# Patient Record
Sex: Male | Born: 1965 | Race: White | Hispanic: No | Marital: Married | Smoking: Former smoker
Health system: Southern US, Community
[De-identification: ages and names within clinical notes are randomized; demographics above are authoritative.]

## PROBLEM LIST (undated history)

## (undated) DIAGNOSIS — K219 Gastro-esophageal reflux disease without esophagitis: Secondary | ICD-10-CM

## (undated) DIAGNOSIS — E079 Disorder of thyroid, unspecified: Secondary | ICD-10-CM

## (undated) DIAGNOSIS — T7840XA Allergy, unspecified, initial encounter: Secondary | ICD-10-CM

## (undated) DIAGNOSIS — H5440 Blindness, one eye, unspecified eye: Secondary | ICD-10-CM

## (undated) DIAGNOSIS — Z1810 Retained metal fragments, unspecified: Secondary | ICD-10-CM

## (undated) DIAGNOSIS — R112 Nausea with vomiting, unspecified: Secondary | ICD-10-CM

## (undated) DIAGNOSIS — Z8489 Family history of other specified conditions: Secondary | ICD-10-CM

## (undated) DIAGNOSIS — I10 Essential (primary) hypertension: Secondary | ICD-10-CM

## (undated) DIAGNOSIS — F419 Anxiety disorder, unspecified: Secondary | ICD-10-CM

## (undated) DIAGNOSIS — L570 Actinic keratosis: Secondary | ICD-10-CM

## (undated) DIAGNOSIS — Z9889 Other specified postprocedural states: Secondary | ICD-10-CM

## (undated) HISTORY — DX: Anxiety disorder, unspecified: F41.9

## (undated) HISTORY — DX: Allergy, unspecified, initial encounter: T78.40XA

## (undated) HISTORY — DX: Actinic keratosis: L57.0

## (undated) HISTORY — DX: Disorder of thyroid, unspecified: E07.9

---

## 1985-10-30 HISTORY — PX: EYE SURGERY: SHX253

## 2001-10-30 HISTORY — PX: KNEE ARTHROSCOPY: SUR90

## 2004-11-21 ENCOUNTER — Ambulatory Visit: Payer: Self-pay

## 2005-09-27 ENCOUNTER — Ambulatory Visit: Payer: Self-pay

## 2011-10-06 ENCOUNTER — Encounter: Payer: Self-pay | Admitting: Internal Medicine

## 2011-10-06 ENCOUNTER — Ambulatory Visit (INDEPENDENT_AMBULATORY_CARE_PROVIDER_SITE_OTHER): Payer: 59 | Admitting: Internal Medicine

## 2011-10-06 VITALS — BP 122/84 | HR 69 | Temp 97.9°F | Resp 16 | Ht 69.0 in | Wt 213.2 lb

## 2011-10-06 DIAGNOSIS — Z23 Encounter for immunization: Secondary | ICD-10-CM

## 2011-10-06 DIAGNOSIS — E039 Hypothyroidism, unspecified: Secondary | ICD-10-CM

## 2011-10-06 DIAGNOSIS — R197 Diarrhea, unspecified: Secondary | ICD-10-CM | POA: Insufficient documentation

## 2011-10-06 NOTE — Progress Notes (Signed)
  Subjective:    Patient ID: Tim Owen, male    DOB: 05-Nov-1965, 45 y.o.   MRN: [id number]  HPI  Tim Owen is a healthy 45 yo white male who is transferring care form [address] [facility]).  In 2008 he had a History of 35 lb wt loss, followed by an even greater weight gain, accompanied by severe muscle cramps secondary to rhabdomyolysis and was eventually diagnosed with Hashimoto's thyroiditis.  He is currently symptom free except occasional muscle cramps and has recently recovered from a GI virus which started 3 days ago with N/V and diarrhea .    Past Medical History  Diagnosis Date  . hypothyroidism    No current outpatient prescriptions on file prior to visit.    Review of Systems  Constitutional: Negative for fever, chills, diaphoresis, activity change, appetite change, fatigue and unexpected weight change.  HENT: Negative for hearing loss, ear pain, nosebleeds, congestion, sore throat, facial swelling, rhinorrhea, sneezing, drooling, mouth sores, trouble swallowing, neck pain, neck stiffness, dental problem, voice change, postnasal drip, sinus pressure, tinnitus and ear discharge.   Eyes: Negative for photophobia, pain, discharge, redness, itching and visual disturbance.  Respiratory: Negative for apnea, cough, choking, chest tightness, shortness of breath, wheezing and stridor.   Cardiovascular: Negative for chest pain, palpitations and leg swelling.  Gastrointestinal: Negative for nausea, vomiting, abdominal pain, diarrhea, constipation, blood in stool, abdominal distention, anal bleeding and rectal pain.  Genitourinary: Negative for dysuria, urgency, frequency, hematuria, flank pain, decreased urine volume, scrotal swelling, difficulty urinating and testicular pain.  Musculoskeletal: Negative for myalgias, back pain, joint swelling, arthralgias and gait problem.  Skin: Negative for color change, rash and wound.  Neurological: Negative for dizziness, tremors, seizures,  syncope, speech difficulty, weakness, light-headedness, numbness and headaches.  Psychiatric/Behavioral: Negative for suicidal ideas, hallucinations, behavioral problems, confusion, sleep disturbance, dysphoric mood, decreased concentration and agitation. The patient is not nervous/anxious.        Objective:   Physical Exam  Constitutional: He is oriented to person, place, and time.  HENT:  Head: Normocephalic and atraumatic.  Mouth/Throat: Oropharynx is clear and moist.  Eyes: Conjunctivae and EOM are normal.  Neck: Normal range of motion. Neck supple. No JVD present. No thyromegaly present.  Cardiovascular: Normal rate, regular rhythm and normal heart sounds.   Pulmonary/Chest: Effort normal and breath sounds normal. He has no wheezes. He has no rales.  Abdominal: Soft. Bowel sounds are normal. He exhibits no mass. There is no tenderness. There is no rebound.  Musculoskeletal: Normal range of motion. He exhibits no edema.  Neurological: He is alert and oriented to person, place, and time.  Skin: Skin is warm and dry.  Psychiatric: He has a normal mood and affect.          Assessment & Plan:

## 2011-10-08 ENCOUNTER — Encounter: Payer: Self-pay | Admitting: Internal Medicine

## 2011-10-08 DIAGNOSIS — E039 Hypothyroidism, unspecified: Secondary | ICD-10-CM | POA: Insufficient documentation

## 2011-10-08 NOTE — Assessment & Plan Note (Signed)
Secondary to Hashimoto's thyroiditis whic presented with rhabdomyolysis and weight loss.  Now managed with Synthroid.  With TSH done in the last six months  .  Records requested.

## 2011-10-08 NOTE — Assessment & Plan Note (Signed)
secondary to viral gastroenteritis,  acute.  Will check renal function and electrolytes.

## 2011-10-08 NOTE — Patient Instructions (Signed)
Please return in 6 months for our annual physical.  We are checking your electrolytes given your recent diarrhea episode.

## 2011-10-10 ENCOUNTER — Telehealth: Payer: Self-pay | Admitting: Internal Medicine

## 2011-10-10 NOTE — Telephone Encounter (Signed)
[facility] labs reviewed,.  His thyroid level is fine,  Cholesterol is ok but his good cholesterol,   HDL is low at 36.  And LDL is ok at 110.  Kidney fucntion was a little off which can be from mild dehydration. .  Would lilke to repeat it in  3 month (BMET_ in a nonfasting state .  The mediterrranean Diet may help raise his HDL.  He can google it.

## 2011-10-12 NOTE — Telephone Encounter (Signed)
Left message asking patient to return my call.

## 2011-10-13 ENCOUNTER — Other Ambulatory Visit: Payer: Self-pay | Admitting: Internal Medicine

## 2011-10-13 DIAGNOSIS — E785 Hyperlipidemia, unspecified: Secondary | ICD-10-CM

## 2011-10-13 NOTE — Telephone Encounter (Signed)
Patient notified. Order for BMET mailed to patient. His wife is a [facility] employee and he will have labs drawn there.

## 2011-10-18 ENCOUNTER — Telehealth: Payer: Self-pay | Admitting: Internal Medicine

## 2011-10-18 NOTE — Telephone Encounter (Signed)
His thyroid function is within normal limits.  His liver enzymes and kidney function are a tiny bit  abnormal  And I dont' have any old labs for comparison  , so I would like to repeat a CMET in one month in a nfasting state  to see if both normalize.  cholsterol is ok,.

## 2011-10-18 NOTE — Telephone Encounter (Signed)
No, it was minimal and we can wait then ntil I see the old labs from his prior PCP

## 2011-10-18 NOTE — Telephone Encounter (Signed)
Patient advised as instructed via telephone.  He stated that his Creatine levels have always been elevated.  Would you still like him to repeat lab work again in one month?  Please advise.

## 2011-10-18 NOTE — Telephone Encounter (Signed)
Patient advised as instructed via telephone. 

## 2011-11-13 ENCOUNTER — Encounter: Payer: Self-pay | Admitting: Internal Medicine

## 2012-01-02 ENCOUNTER — Other Ambulatory Visit: Payer: Self-pay | Admitting: Internal Medicine

## 2012-01-02 MED ORDER — LEVOTHYROXINE SODIUM 137 MCG PO TABS: 137.0000 ug | ORAL_TABLET | Freq: Every day | ORAL | Status: DC

## 2012-02-06 ENCOUNTER — Telehealth: Payer: Self-pay | Admitting: Internal Medicine

## 2012-02-06 NOTE — Telephone Encounter (Signed)
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 States he "turned over" his left ankle. States he felt a "pop". States injury occurred at approx. 1100 02/06/12. States swelling and ecchymosis noted of lateral malleoulus. States ankle is swollen twice it's normal size. Patient is able to bear weight. Patient took Ibuprofen 600mg . @ approx. 1230 02/06/12. Triage per Ankle Injury Protocol. No emergent sx identified. Care advice given per guidelines. Patient advised ice, elevation, no weight bearing. Patient advised Ibuprofen 800mg . q 8 hours prn for pain, take with food. Patient advised not to drive. No appts. available in Epic System. RN spoke with Erie Noe in office. States to advise patient to go to [facility] for evaluation. Patient advised of above. Patient verbalizes understanding and agreeable. Protocol(s) Used: Ankle Injury Recommended Outcome per Protocol: See Provider within 4 hours Reason for Outcome: New marked swelling (twice the normal size as compared to usual appearance) Care Advice: ~ Another adult should drive. ~ Apply cloth-covered ice pack to affected area. Apply 20 minutes on, then 20 minutes off until evaluated by provider. ~ SYMPTOM / CONDITION MANAGEMENT ~ List, or take, all current prescription(s), nonprescription or alternative medication(s) to provider for evaluation. Elevate and support part in a position of comfort so it is elevated at least 12 inches (30 cm) above the level of heart until evaluated by provider. ~ Go to ED  immediately if extremity: - is cool below injury - has a change in sensation (numbness, tingling) - has a noticeable change in color (pale, bluish) ~ 02/06/2012 2:02:20PM Page 1 of 1 [id number]

## 2012-02-06 NOTE — Telephone Encounter (Signed)
I do not understand why triage does not direct this type of call to an ortho clinic or an urgent care clinic. He may have a fractured malleolus, and there is nothing I can do here except send him elsewhere. Plesae call patient in mornign and find out what he decided to do. [name]

## 2012-02-06 NOTE — Telephone Encounter (Signed)
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 States he "turned over" his left ankle. States he felt a "pop". States injury occurred at approx. 1100 02/06/12. States swelling and ecchymosis noted of lateral malleoulus. States ankle is swollen twice it's normal size. Patient is able to bear weight. Patient took Ibuprofen 600mg . @ approx. 1230 02/06/12. Triage per Ankle Injury Protocol. No emergent sx identified. Care advice given per guidelines. Patient advised ice, elevation, no weight bearing. Patient  advised Ibuprofen 800mg . q 8 hours prn for pain, take with food.  Patient advised not to drive. No appts. available in Epic System. RN spoke with Erie Noe in office. States to advise patient to go to [facility] for evaluation. Patient advised of above. Patient verbalizes understanding and agreeable.

## 2012-02-07 NOTE — Telephone Encounter (Signed)
The triage nurse did advise Urgent Care at the end of the message it states "patient advised to go to [facility] for evaluation".  I called him and he stated he did go to [facility] walk-in and he did fracture his foot.  He stated he has an appt with ortho next week.

## 2012-04-08 ENCOUNTER — Ambulatory Visit (INDEPENDENT_AMBULATORY_CARE_PROVIDER_SITE_OTHER): Payer: 59 | Admitting: Internal Medicine

## 2012-04-08 ENCOUNTER — Encounter: Payer: Self-pay | Admitting: Internal Medicine

## 2012-04-08 VITALS — BP 116/78 | HR 68 | Temp 97.6°F | Resp 16 | Wt 204.2 lb

## 2012-04-08 DIAGNOSIS — S82892A Other fracture of left lower leg, initial encounter for closed fracture: Secondary | ICD-10-CM | POA: Insufficient documentation

## 2012-04-08 DIAGNOSIS — S82899A Other fracture of unspecified lower leg, initial encounter for closed fracture: Secondary | ICD-10-CM

## 2012-04-08 DIAGNOSIS — E039 Hypothyroidism, unspecified: Secondary | ICD-10-CM | POA: Insufficient documentation

## 2012-04-08 DIAGNOSIS — Z79899 Other long term (current) drug therapy: Secondary | ICD-10-CM

## 2012-04-08 DIAGNOSIS — E785 Hyperlipidemia, unspecified: Secondary | ICD-10-CM

## 2012-04-08 DIAGNOSIS — R042 Hemoptysis: Secondary | ICD-10-CM | POA: Insufficient documentation

## 2012-04-08 DIAGNOSIS — R059 Cough, unspecified: Secondary | ICD-10-CM

## 2012-04-08 DIAGNOSIS — N529 Male erectile dysfunction, unspecified: Secondary | ICD-10-CM | POA: Insufficient documentation

## 2012-04-08 DIAGNOSIS — R05 Cough: Secondary | ICD-10-CM

## 2012-04-08 LAB — CBC WITH DIFFERENTIAL/PLATELET
Basophils Absolute: 0 10*3/uL (ref 0.0–0.2)
Eosinophils Absolute: 0.2 10*3/uL (ref 0.0–0.4)
Immature Grans (Abs): 0 10*3/uL (ref 0.0–0.1)
Lymphs: 20 % (ref 14–46)
MCH: 33.5 pg — ABNORMAL HIGH (ref 26.6–33.0)
MCHC: 34.3 g/dL (ref 31.5–35.7)
Monocytes Absolute: 0.8 10*3/uL (ref 0.1–1.0)
Neutrophils Absolute: 6.7 10*3/uL (ref 1.8–7.8)
Neutrophils Relative %: 69 % (ref 40–74)

## 2012-04-08 LAB — BASIC METABOLIC PANEL
BUN: 17 mg/dL (ref 6–24)
Calcium: 9.5 mg/dL (ref 8.7–10.2)
Chloride: 103 mmol/L (ref 97–108)
GFR calc Af Amer: 88 mL/min/{1.73_m2} (ref >59)
Glucose: 90 mg/dL (ref 65–99)

## 2012-04-08 LAB — LIPID PANEL
Cholesterol, Total: 176 mg/dL (ref 100–199)
LDL Calculated: 124 mg/dL — ABNORMAL HIGH (ref 0–99)
Triglycerides: 67 mg/dL (ref 0–149)
VLDL Cholesterol Cal: 13 mg/dL (ref 5–40)

## 2012-04-08 LAB — HEPATIC FUNCTION PANEL
ALT: 33 IU/L (ref 0–44)
Alkaline Phosphatase: 67 IU/L (ref 25–150)
Total Protein: 7.2 g/dL (ref 6.0–8.5)

## 2012-04-08 LAB — TSH: TSH: 3.01 u[IU]/mL (ref 0.450–4.500)

## 2012-04-08 MED ORDER — AMOXICILLIN-POT CLAVULANATE 875-125 MG PO TABS: 1.0000 | ORAL_TABLET | Freq: Two times a day (BID) | ORAL | Status: AC

## 2012-04-08 MED ORDER — BENZONATATE 200 MG PO CAPS: 200.0000 mg | ORAL_CAPSULE | Freq: Two times a day (BID) | ORAL | Status: AC | PRN

## 2012-04-08 MED ORDER — PREDNISONE (PAK) 10 MG PO TABS: ORAL_TABLET | ORAL | Status: AC

## 2012-04-08 MED ORDER — ALPRAZOLAM 0.25 MG PO TABS: 0.2500 mg | ORAL_TABLET | Freq: Every evening | ORAL | Status: DC | PRN

## 2012-04-08 NOTE — Assessment & Plan Note (Signed)
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 He has chosed not to purse corrective surgery.  Advised to discuss longterm complications of avoiding surgery (oa, ankle instability)

## 2012-04-08 NOTE — Assessment & Plan Note (Signed)
He brought up the recent development of impotence.  His erections are not as strong as they used to be, and he no longer wakes up with an erection as he used to .  He and his wife are dismayed by this development .  Checking Testosterone levels.

## 2012-04-08 NOTE — Assessment & Plan Note (Addendum)
He was treated for bronchitis several weeks ago with progression of symptoms to cough with blood streaked spuutm.  Given his history of tobacco abuse, will need to evaluate for empyema/mass.. But will treat for sinusitis/mucopuruelent tracheitis with augmentin, prednisone and cough suppressants. Also recommedned use of saline sinus irrigants.

## 2012-04-08 NOTE — Progress Notes (Signed)
Patient ID: Tim Owen, male   DOB: May 03, 1966, 46 y.o.   MRN: [id number]   Patient Active Problem List  Diagnoses  . Diarrhea  . Hypothyroidism (acquired)  . Impotence  . Unspecified hypothyroidism  . Cough with hemoptysis  . Closed left malleolar fracture    Subjective:  CC:   Chief Complaint  Patient presents with  . Follow-up    6 month    HPI:   Tim Owen a 46 y.o. male who presents follow up on several recent issues.  He sustained an avulsed lateral malleolar fracture of his left foot two months ago after jumping off of a flatbed.  The fracture was treated by an orthopedist with a surgical boot for two weeks with failure to heal and was referred to podiatry.  Dr. Clide Cliff offered surgery as the only reparative measure, but patient has deferred since he is currently having no pain unless he drags his foot sideways ( an unusual motion except in dancing and tennis).  More recently he was treated at [facility] for bronchitis with a 3 day Z pack and benzonatate for daytime cough,  tussionex for nighttime . His cough was nonproductive intially but has become producitve over the last 7 days with episodes of blood streaked sputum in the early morning and rust colored throughout the rest of the day,  He developed a sore throat started yesterday.  No travel except to the beach 3 weeks ago.  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 He has a history of tobacco use and quit smoking a year ago .   Past Medical History  Diagnosis Date  . hypothyroidism     Past Surgical History  Procedure Date  . 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 Knee arthroscopy 2003    right , with residual numbness medial side of calf         The following portions of the patient's history were reviewed and updated as appropriate: Allergies, current medications, and problem list.    Review of Systems:   12 Pt   review of systems was negative except those addressed in the HPI,     History   Social History  . Marital Status: Married    Spouse Name: N/A    Number of Children: N/A  . Years of Education: N/A   Occupational History  . Not on file.   Social History Main Topics  . Smoking status: Former Smoker    Types: Cigarettes    Quit date: 01/02/2011  . Smokeless tobacco: Never Used  . Alcohol Use: 7.2 oz/week    12 Cans of beer per week  . Drug Use: No  . Sexually Active: Not on file   Other Topics Concern  . Not on file   Social History Narrative  . No narrative on file    Objective:  BP 116/78  Pulse 68  Temp(Src) 97.6 F (36.4 C) (Oral)  Resp 16  Wt 204 lb 4 oz (92.647 kg)  SpO2 98%  General appearance: alert, cooperative and appears stated age Ears: normal TM's and external ear canals both ears Throat: lips, mucosa, and tongue normal; teeth and gums normal Neck: no adenopathy, no carotid bruit, supple, symmetrical, trachea midline and thyroid not enlarged, symmetric, no tenderness/mass/nodules Back: symmetric, no curvature. ROM normal. No CVA tenderness. Lungs: clear to auscultation bilaterally Heart: regular rate and  rhythm, S1, S2 normal, no murmur, click, rub or gallop Abdomen: soft, non-tender; bowel sounds normal; no masses,  no organomegaly Pulses: 2+ and symmetric Skin: Skin color, texture, turgor normal. No rashes or lesions Lymph nodes: Cervical, supraclavicular, and axillary nodes normal.  Assessment and Plan:  Cough with hemoptysis He was treated for bronchitis several weeks ago with progression of symptoms to cough with blood streaked spuutm.  Given his history of tobacco abuse, will need to evaluate for empyema/mass.. But will treat for sinusitis/mucopuruelent tracheitis with augmentin, prednisone and cough suppressants. Also recommedned use of saline sinus irrigants.   Impotence He brought up the recent development of impotence.  His erections are  not as strong as they used to be, and he no longer wakes up with an erection as he used to .  He and his wife are dismayed by this development .  Checking Testosterone levels.   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 He has chosed not to purse corrective surgery.  Advised to discuss longterm complications of avoiding surgery (oa, ankle instability)    Updated Medication List Outpatient Encounter Prescriptions as of 04/08/2012  Medication Sig Dispense Refill  . ALPRAZolam (XANAX) 0.25 MG tablet Take 1 tablet (0.25 mg total) by mouth at bedtime as needed.  30 tablet  3  . levothyroxine (SYNTHROID, LEVOTHROID) 137 MCG tablet Take 1 tablet (137 mcg total) by mouth daily.  90 tablet  3  . DISCONTD: ALPRAZolam (XANAX) 0.25 MG tablet Take 0.25 mg by mouth at bedtime as needed.        [name] amoxicillin-clavulanate (AUGMENTIN) 875-125 MG per tablet Take 1 tablet by mouth 2 (two) times daily.  14 tablet  0  . benzonatate (TESSALON) 200 MG capsule Take 1 capsule (200 mg total) by mouth 2 (two) times daily as needed for cough.  60 capsule  1  . predniSONE (STERAPRED UNI-PAK) 10 MG tablet 6 tablets on Day 1 , then reduce by 1 tablet daily until gone  21 tablet  0     Orders Placed This Encounter  Procedures  . DG Chest 2 View  . Lipid panel  . CBC with Differential  . Hepatic function panel  . Basic metabolic panel  . TSH  . Testosterone, free, total    No Follow-up on file.

## 2012-04-08 NOTE — Patient Instructions (Signed)
You have a sinus  infection   .  I am prescribing an antibiotic and a prednisone taper  To manage the inflammation in your ear/sinuses.   I also advise use of the following OTC meds to help with your other symptoms.   Take generic OTC benadryl 25 mg every 8 hours for the drainage,  Sudafed PE  10 to 30 mg every 8 hours for the congestion, you may substitute Afrin nasal spray for the nighttime dose of sudafed PE  If needed to prevent insomnia.  flushes your sinuses twice daily with Simply Saline (do over the sink because if you do it right you will spit out globs of mucus)  Use benzonatate capsules FOR THE COUGH.  Gargle with salt water as needed for sore throat.

## 2012-04-10 LAB — TESTOSTERONE, FREE, TOTAL, SHBG
Testosterone, Free: 10.1 pg/mL (ref 6.8–21.5)
Testosterone, total: 371.1 ng/dL (ref 348.0–1197.0)

## 2012-07-17 ENCOUNTER — Telehealth: Payer: Self-pay | Admitting: Internal Medicine

## 2012-07-17 NOTE — Telephone Encounter (Signed)
Patient did not have x-ray done . The medicine he was given helped per patient.

## 2012-08-05 ENCOUNTER — Telehealth: Payer: Self-pay | Admitting: Internal Medicine

## 2012-08-05 NOTE — Telephone Encounter (Signed)
Wellness forms for insurance in box

## 2012-08-10 ENCOUNTER — Telehealth: Payer: Self-pay | Admitting: Internal Medicine

## 2012-08-10 DIAGNOSIS — Z139 Encounter for screening, unspecified: Secondary | ICD-10-CM

## 2012-08-10 NOTE — Telephone Encounter (Signed)
The wellness form that he requested I fill out cannot be completed without a nurse/lab visit for a cotinine level which is a blood draw  to confirm confirm tobacco abstinence, a waist circumference and a height.  He needs his form completed by November 1 oh please have him make an appointment as soon as possible for these things. The blood test takes at least a week to resolve so he needs to come in soon.

## 2012-08-12 ENCOUNTER — Telehealth: Payer: Self-pay

## 2012-08-12 NOTE — Telephone Encounter (Signed)
Per wife he can come in on Wednesday this week to have all this taken care of.

## 2012-08-14 ENCOUNTER — Ambulatory Visit: Payer: 59

## 2012-08-14 DIAGNOSIS — Z139 Encounter for screening, unspecified: Secondary | ICD-10-CM

## 2012-08-16 NOTE — Telephone Encounter (Signed)
error 

## 2012-08-19 ENCOUNTER — Telehealth: Payer: Self-pay | Admitting: Internal Medicine

## 2012-08-19 DIAGNOSIS — Z139 Encounter for screening, unspecified: Secondary | ICD-10-CM

## 2012-08-19 NOTE — Telephone Encounter (Signed)
Spoke to patient he is coming in tomorrow to get labs done for Hgb A1c. Can you put in an order, he needs it to complete his paperwork.

## 2012-08-19 NOTE — Telephone Encounter (Signed)
A1c ordered.

## 2012-08-19 NOTE — Telephone Encounter (Signed)
Caller: [name]/Patient; Patient Name: Tim Owen; PCP: Duncan Dull (Adults only); Best Callback Phone Number: [phone].  Patient states he is returning call from Nurse Debbie concerning lab test.  PLEASE CALL PATIENT BACK.  THANK YOU.

## 2012-08-20 ENCOUNTER — Other Ambulatory Visit (INDEPENDENT_AMBULATORY_CARE_PROVIDER_SITE_OTHER): Payer: 59

## 2012-08-20 DIAGNOSIS — Z139 Encounter for screening, unspecified: Secondary | ICD-10-CM

## 2012-08-21 ENCOUNTER — Telehealth: Payer: Self-pay | Admitting: Internal Medicine

## 2012-08-21 LAB — HEMOGLOBIN A1C: Hgb A1c MFr Bld: 5.7 % (ref 4.6–6.5)

## 2012-08-21 NOTE — Telephone Encounter (Signed)
Pt called checking on his wellness form for his insurance.  Pt stated he had to have this turned in by nov 1 or his insurance doubles please advise pt when ready

## 2012-08-21 NOTE — Telephone Encounter (Signed)
Dr. Darrick Huntsman do you know the status of this form?  Thanks

## 2012-08-22 ENCOUNTER — Telehealth: Payer: Self-pay | Admitting: Internal Medicine

## 2012-08-22 NOTE — Telephone Encounter (Signed)
I do not have the form. I sent you his last lab result asking you if you had it . If you cannot find it ask his company to fax another one to [place].

## 2012-08-22 NOTE — Telephone Encounter (Signed)
His CPE form is now complete and ready to go.

## 2012-08-22 NOTE — Telephone Encounter (Signed)
Patient notified that Rx was ready for pick up

## 2012-11-22 ENCOUNTER — Other Ambulatory Visit: Payer: Self-pay | Admitting: Internal Medicine

## 2012-11-22 NOTE — Telephone Encounter (Signed)
Med filled.  

## 2012-11-29 ENCOUNTER — Encounter: Payer: Self-pay | Admitting: Internal Medicine

## 2012-11-29 ENCOUNTER — Ambulatory Visit (INDEPENDENT_AMBULATORY_CARE_PROVIDER_SITE_OTHER): Payer: 59 | Admitting: Internal Medicine

## 2012-11-29 ENCOUNTER — Ambulatory Visit (INDEPENDENT_AMBULATORY_CARE_PROVIDER_SITE_OTHER)
Admission: RE | Admit: 2012-11-29 | Discharge: 2012-11-29 | Disposition: A | Discharge status: Home / Self Care | Payer: 59 | Source: Ambulatory Visit | Attending: Internal Medicine | Admitting: Internal Medicine

## 2012-11-29 VITALS — BP 122/80 | HR 64 | Temp 98.2°F | Resp 16 | Wt 207.0 lb

## 2012-11-29 DIAGNOSIS — D235 Other benign neoplasm of skin of trunk: Secondary | ICD-10-CM

## 2012-11-29 DIAGNOSIS — D229 Melanocytic nevi, unspecified: Secondary | ICD-10-CM

## 2012-11-29 DIAGNOSIS — M25511 Pain in right shoulder: Secondary | ICD-10-CM

## 2012-11-29 DIAGNOSIS — D239 Other benign neoplasm of skin, unspecified: Secondary | ICD-10-CM

## 2012-11-29 DIAGNOSIS — M25519 Pain in unspecified shoulder: Secondary | ICD-10-CM

## 2012-11-29 MED ORDER — TRAMADOL HCL 50 MG PO TABS: 50.0000 mg | ORAL_TABLET | Freq: Three times a day (TID) | ORAL | Status: DC | PRN

## 2012-11-29 MED ORDER — MELOXICAM 15 MG PO TABS: 15.0000 mg | ORAL_TABLET | Freq: Every day | ORAL | Status: DC

## 2012-11-29 NOTE — Progress Notes (Signed)
Patient ID: Tim Owen, male   DOB: [birth year], 47 y.o.   MRN: [id number]  Patient Active Problem List  Diagnosis  . Diarrhea  . Hypothyroidism (acquired)  . Impotence  . Unspecified hypothyroidism  . Cough with hemoptysis  . Closed left malleolar fracture  . Atypical nevus  . Shoulder pain, right    Subjective:  CC:   Chief Complaint  Patient presents with  . Shoulder pain    HPI:   Tim Owen a 47 y.o. male who presents for evaluation of 1) acute on chronic right shoulder pain.  Patient reports that his shoulder  started hurting spontaneously one day without any prior trauma or unusual activity, but he swings a tire hammer a lot during his daily activities.  Feels like a knife sticking in the Top of the humerus in the biceps groove.  It is aggravated by certain sleeping positions and by use of the arm.  The pain is intermittent and is not present currently except with certain movements of the arm .  2) Changing hyperpigmented lesion left chest wall.   Patient has noticed a change in shape and has noted a faint change in pigmentation in a circumferential area about 3 cm in diameter around the lesion.     Past Medical History  Diagnosis Date  . hypothyroidism     Past Surgical History  Procedure Date  . 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 Knee arthroscopy 2003    right , with residual numbness medial side of calf    The following portions of the patient's history were reviewed and updated as appropriate: Allergies, current medications, and problem list.    Review of Systems:   Patient denies headache, fevers, malaise, unintentional weight loss, skin rash, eye pain, sinus congestion and sinus pain, sore throat, dysphagia,  hemoptysis , cough, dyspnea, wheezing, chest pain, palpitations, orthopnea, edema, abdominal pain, nausea, melena, diarrhea, constipation, flank pain, dysuria, hematuria, urinary  Frequency, nocturia,  numbness, tingling, seizures,  Focal weakness, Loss of consciousness,  Tremor, insomnia, depression, anxiety, and suicidal ideation.       History   Social History  . Marital Status: Married    Spouse Name: N/A    Number of Children: N/A  . Years of Education: N/A   Occupational History  . Not on file.   Social History Main Topics  . Smoking status: Former Smoker    Types: Cigarettes    Quit date: 01/02/2011  . Smokeless tobacco: Never Used  . Alcohol Use: 7.2 oz/week    12 Cans of beer per week  . Drug Use: No  . Sexually Active: Not on file   Other Topics Concern  . Not on file   Social History Narrative  . No narrative on file    Objective:  BP 122/80  Pulse 64  Temp 98.2 F (36.8 C) (Oral)  Resp 16  Wt 207 lb (93.895 kg)  SpO2 98%  General appearance: alert, cooperative and appears stated age Ears: normal TM's and external ear canals both ears Throat: lips, mucosa, and tongue normal; teeth and gums normal Neck: no adenopathy, no carotid bruit, supple, symmetrical, trachea midline and thyroid not enlarged, symmetric, no tenderness/mass/nodules Back: symmetric, no curvature. ROM normal. No CVA tenderness. Lungs: clear to auscultation bilaterally MSK: right shoulder : pain elicited in deltoid area with forced abduction and internal rotation Heart: regular rate and rhythm, S1, S2 normal, no  murmur, click, rub or gallop Abdomen: soft, non-tender; bowel sounds normal; no masses,  no organomegaly Pulses: 2+ and symmetric Skin: left chest wall: brown pigmented macule with irregular border surrounded by 3 cm area of slightly hypopigmented skin.  Lymph nodes: Cervical, supraclavicular, and axillary nodes normal.  Assessment and Plan:  Atypical nevus Wife and patient have noted changes to the nevus on his left upper chest area and would like a second opinion    Dermatology referral in place.   Shoulder pain, right Exam notes pain with abduction and internal  rotation at the top of the humerus suggesting bone spur or bursitis.  Plain films were normal.  Suggest nsaids and tramadol prn an dreutrn for steroid injection if no better in one week.    Updated Medication List Outpatient Encounter Prescriptions as of 11/29/2012  Medication Sig Dispense Refill  . ALPRAZolam (XANAX) 0.25 MG tablet Take 1 tablet (0.25 mg total) by mouth at bedtime as needed.  30 tablet  3  . SYNTHROID 137 MCG tablet Take 1 tablet by mouth  daily  90 each  1  . meloxicam (MOBIC) 15 MG tablet Take 1 tablet (15 mg total) by mouth daily.  30 tablet  0  . traMADol (ULTRAM) 50 MG tablet Take 1 tablet (50 mg total) by mouth every 8 (eight) hours as needed for pain.  60 tablet  1     Orders Placed This Encounter  Procedures  . DG Shoulder Right  . Ambulatory referral to Dermatology    No Follow-up on file.

## 2012-11-29 NOTE — Patient Instructions (Addendum)
I am referring you to [facility] (Dr Roseanne Kaufman and Dr Adolphus Birchwood) to look at the changing mole on your chest  Your shoulder may have a bone spur causing your pain  Please use the celebrex twice daily followed by once daily meloxicam as an anti inflammatory  for the next week .   You can add tramadol as a pain reliever  Return next Wednesday for a steroid injection in the shoulder.

## 2012-12-01 ENCOUNTER — Encounter: Payer: Self-pay | Admitting: Internal Medicine

## 2012-12-01 DIAGNOSIS — G8929 Other chronic pain: Secondary | ICD-10-CM | POA: Insufficient documentation

## 2012-12-01 DIAGNOSIS — M25511 Pain in right shoulder: Secondary | ICD-10-CM | POA: Insufficient documentation

## 2012-12-01 DIAGNOSIS — D229 Melanocytic nevi, unspecified: Secondary | ICD-10-CM | POA: Insufficient documentation

## 2012-12-01 NOTE — Assessment & Plan Note (Signed)
Exam notes pain with abduction and internal rotation at the top of the humerus suggesting bone spur or bursitis.  Plain films were normal.  Suggest nsaids and tramadol prn an dreutrn for steroid injection if no better in one week.

## 2012-12-01 NOTE — Assessment & Plan Note (Signed)
Wife and patient have noted changes to the nevus on his left upper chest area and would like a second opinion    Dermatology referral in place.

## 2012-12-04 ENCOUNTER — Ambulatory Visit: Payer: 59 | Admitting: Internal Medicine

## 2013-02-12 ENCOUNTER — Ambulatory Visit (INDEPENDENT_AMBULATORY_CARE_PROVIDER_SITE_OTHER): Payer: 59 | Admitting: Internal Medicine

## 2013-02-12 ENCOUNTER — Other Ambulatory Visit: Payer: Self-pay | Admitting: Internal Medicine

## 2013-02-12 ENCOUNTER — Encounter: Payer: Self-pay | Admitting: Internal Medicine

## 2013-02-12 VITALS — BP 138/70 | HR 70 | Temp 98.7°F | Resp 16 | Ht 71.0 in | Wt 213.0 lb

## 2013-02-12 DIAGNOSIS — G4762 Sleep related leg cramps: Secondary | ICD-10-CM | POA: Insufficient documentation

## 2013-02-12 DIAGNOSIS — M67919 Unspecified disorder of synovium and tendon, unspecified shoulder: Secondary | ICD-10-CM

## 2013-02-12 DIAGNOSIS — G2581 Restless legs syndrome: Secondary | ICD-10-CM | POA: Insufficient documentation

## 2013-02-12 DIAGNOSIS — E039 Hypothyroidism, unspecified: Secondary | ICD-10-CM

## 2013-02-12 DIAGNOSIS — Z0000 Encounter for general adult medical examination without abnormal findings: Secondary | ICD-10-CM

## 2013-02-12 DIAGNOSIS — R252 Cramp and spasm: Secondary | ICD-10-CM

## 2013-02-12 DIAGNOSIS — M7552 Bursitis of left shoulder: Secondary | ICD-10-CM

## 2013-02-12 NOTE — Progress Notes (Signed)
Patient ID: Tim Owen, male   DOB: Sep 06, 1966, 47 y.o.   MRN: [id number] The patient is here for his annual male physical examination and management of other chronic and acute problems.   Cc new onset left shoulder pain,  Using meloxicam daily for the past week. Also having nocturnal leg cramps      The risk factors are reflected in the social history.  The roster of all physicians providing medical care to patient - is listed in the Snapshot section of the chart.  Activities of daily living:  The patient is 100% independent in all ADLs: dressing, toileting, feeding as well as independent mobility  Home safety : The patient has smoke detectors in the home. He wears seatbelts.  There are no firearms at home. There is no violence in the home.   There is no risks for hepatitis, STDs or HIV. There is no   history of blood transfusion. There is no travel history to infectious disease endemic areas of the world.  The patient has seen their dentist in the last six month and  their eye doctor in the last year.  They do not  have excessive sun exposure. They have seen a dermatoloigist in the last year. Discussed the need for sun protection: hats, long sleeves and use of sunscreen if there is significant sun exposure.   Diet: the importance of a healthy diet is discussed. They do have a healthy diet.  The benefits of regular aerobic exercise were discussed. He exercises a minimum of 30 minutes  5 days per week. Depression screen: there are no signs or vegative symptoms of depression- irritability, change in appetite, anhedonia, sadness/tearfullness.  The following portions of the patient's history were reviewed and updated as appropriate: allergies, current medications, past family history, past medical history,  past surgical history, past social history  and problem list.  Visual acuity was not assessed per patient preference since he has regular follow up with his ophthalmologist. Hearing and body  mass index were assessed and reviewed.   During the course of the visit the patient was educated and counseled about appropriate screening and preventive services including :  nutrition counseling, colorectal cancer screening, and recommended immunizations.    Objective:  BP 138/70  Pulse 70  Temp(Src) 98.7 F (37.1 C) (Oral)  Resp 16  Ht 5\' 11"  (1.803 m)  Wt 213 lb (96.616 kg)  BMI 29.72 kg/m2  SpO2 98%  General Appearance:    Alert, cooperative, no distress, appears stated age  Head:    Normocephalic, without obvious abnormality, atraumatic  Eyes:    PERRL, conjunctiva/corneas clear, EOM's intact, fundi    benign, both eyes       Ears:    Normal TM's and external ear canals, both ears  Nose:   Nares normal, septum midline, mucosa normal, no drainage   or sinus tenderness  Throat:   Lips, mucosa, and tongue normal; teeth and gums normal  Neck:   Supple, symmetrical, trachea midline, no adenopathy;       thyroid:  No enlargement/tenderness/nodules; no carotid   bruit or JVD  Back:     Symmetric, no curvature, ROM normal, no CVA tenderness  Lungs:     Clear to auscultation bilaterally, respirations unlabored  Chest wall:    No tenderness or deformity  Heart:    Regular rate and rhythm, S1 and S2 normal, no murmur, rub   or gallop  Abdomen:     Soft, non-tender, bowel sounds active  all four quadrants,    no masses, no organomegaly  Genitalia:    Normal male without lesion, discharge or tenderness.  No hernias on exam.   Extremities:   Extremities normal, atraumatic, no cyanosis or edema  Pulses:   2+ and symmetric all extremities  Skin:   Skin color, texture, turgor normal, no rashes or lesions  Lymph nodes:   Cervical, supraclavicular, and axillary nodes normal  Neurologic:   CNII-XII intact. Normal strength, sensation and reflexes      throughout   Assessment and Plan:  Routine general medical examination at a health care facility Annual comprehensive exam was done  including testicular and hernia exam. All screenings have been addressed . Fasting lipids were within normal limits.  Muscle cramps Electrolytes  and thyroid function were checked and all normal. He has no evidence of circulatory problems on exam today. Recommended soaking legs in Epsom salts for fatigue muscles. Ulcer recommend using electrode replacement solutions when rehydrating.  Bursitis of left shoulder Suggested by history. Trial of meloxicam and tramadol.[name]  Unspecified hypothyroidism Thyroid function is WNL on current dose.  No current changes needed.    Updated Medication List Outpatient Encounter Prescriptions as of 02/12/2013  Medication Sig Dispense Refill  . ALPRAZolam (XANAX) 0.25 MG tablet Take 1 tablet (0.25 mg total) by mouth at bedtime as needed.  30 tablet  3  . meloxicam (MOBIC) 15 MG tablet Take 1 tablet (15 mg total) by mouth daily.  30 tablet  0  . SYNTHROID 137 MCG tablet Take 1 tablet by mouth  daily  90 each  1  . traMADol (ULTRAM) 50 MG tablet Take 1 tablet (50 mg total) by mouth every 8 (eight) hours as needed for pain.  60 tablet  1   No facility-administered encounter medications on file as of 02/12/2013.

## 2013-02-12 NOTE — Patient Instructions (Addendum)
meloxicam  Plus tylenol plus tramadol OK to combine for pain   Do not combine meloxicam with ibuprofen  (they are too similar and will lead to GI upset ).  We will check your electrolytes today   Try G2 for fluid replacement,

## 2013-02-13 ENCOUNTER — Encounter: Payer: Self-pay | Admitting: General Practice

## 2013-02-13 LAB — MAGNESIUM: Magnesium: 2 mg/dL (ref 1.6–2.6)

## 2013-02-13 LAB — COMPREHENSIVE METABOLIC PANEL
Albumin: 4.8 g/dL (ref 3.5–5.5)
Alkaline Phosphatase: 56 IU/L (ref 39–117)
BUN/Creatinine Ratio: 16 (ref 9–20)
BUN: 19 mg/dL (ref 6–24)
Chloride: 103 mmol/L (ref 97–108)
GFR calc Af Amer: 81 mL/min/{1.73_m2} (ref >59)
Potassium: 4.6 mmol/L (ref 3.5–5.2)
Total Bilirubin: 0.7 mg/dL (ref 0.0–1.2)

## 2013-02-13 LAB — LIPID PANEL WITH LDL/HDL RATIO
Cholesterol, Total: 215 mg/dL — ABNORMAL HIGH (ref 100–199)
LDL Calculated: 157 mg/dL — ABNORMAL HIGH (ref 0–99)
LDl/HDL Ratio: 3.7 ratio units — ABNORMAL HIGH (ref 0.0–3.6)

## 2013-02-13 LAB — TSH: TSH: 1.7 u[IU]/mL (ref 0.450–4.500)

## 2013-02-14 ENCOUNTER — Encounter: Payer: Self-pay | Admitting: Internal Medicine

## 2013-02-14 DIAGNOSIS — M7552 Bursitis of left shoulder: Secondary | ICD-10-CM | POA: Insufficient documentation

## 2013-02-14 DIAGNOSIS — Z0000 Encounter for general adult medical examination without abnormal findings: Secondary | ICD-10-CM | POA: Insufficient documentation

## 2013-02-14 NOTE — Assessment & Plan Note (Signed)
Suggested by history. Trial of meloxicam and tramadol.[name]

## 2013-02-14 NOTE — Assessment & Plan Note (Addendum)
Annual comprehensive exam was done including testicular and hernia exam. All screenings have been addressed . Fasting lipids were within normal limits.

## 2013-02-14 NOTE — Assessment & Plan Note (Addendum)
Electrolytes  and thyroid function were checked and all normal. He has no evidence of circulatory problems on exam today. Recommended soaking legs in Epsom salts for fatigue muscles. Ulcer recommend using electrode replacement solutions when rehydrating.

## 2013-02-14 NOTE — Assessment & Plan Note (Signed)
Thyroid function is WNL on current dose.  No current changes needed.  

## 2013-06-05 IMAGING — CR DG SHOULDER 2+V*R*
3 series · 3 of 3 positions shown · non-contrast
Comparison: None.

CLINICAL DATA: Intermittent shoulder pain with for 3 months with
abduction.

RIGHT SHOULDER - 2+ VIEW

[view not recorded (1 of 3)]
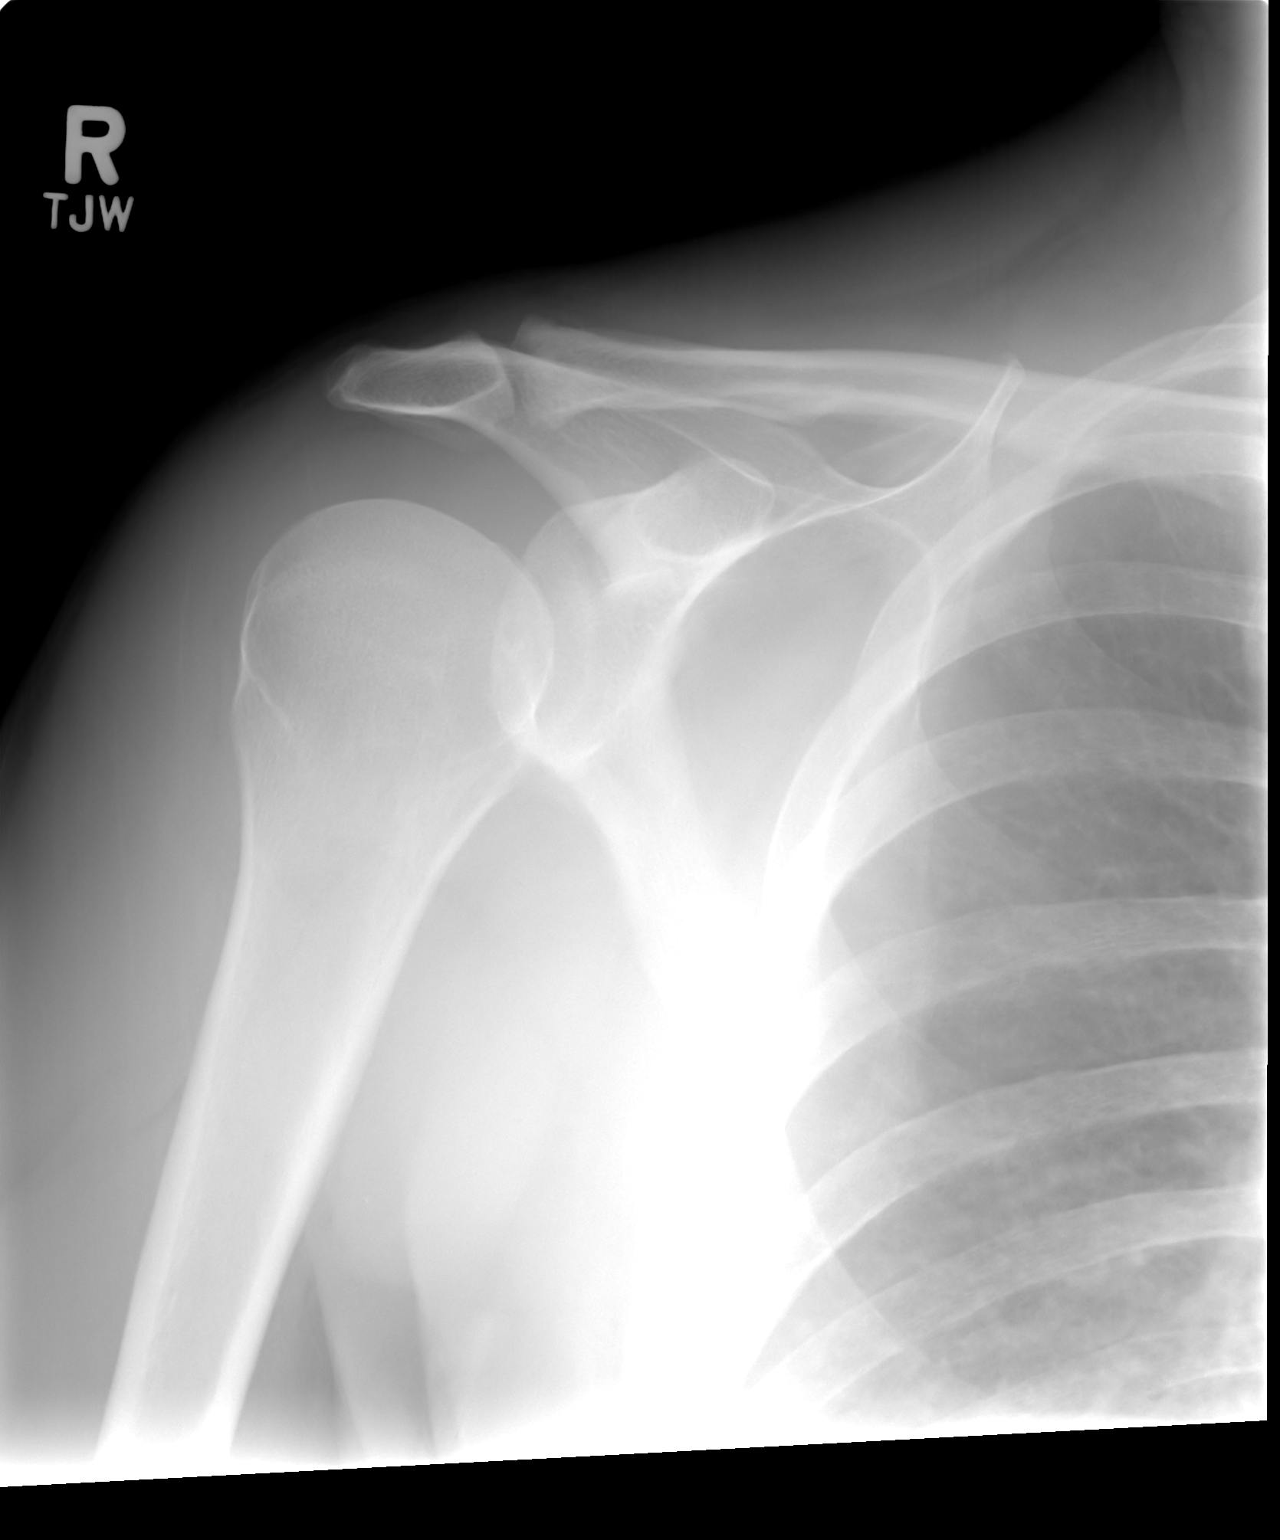

[view not recorded (2 of 3)]
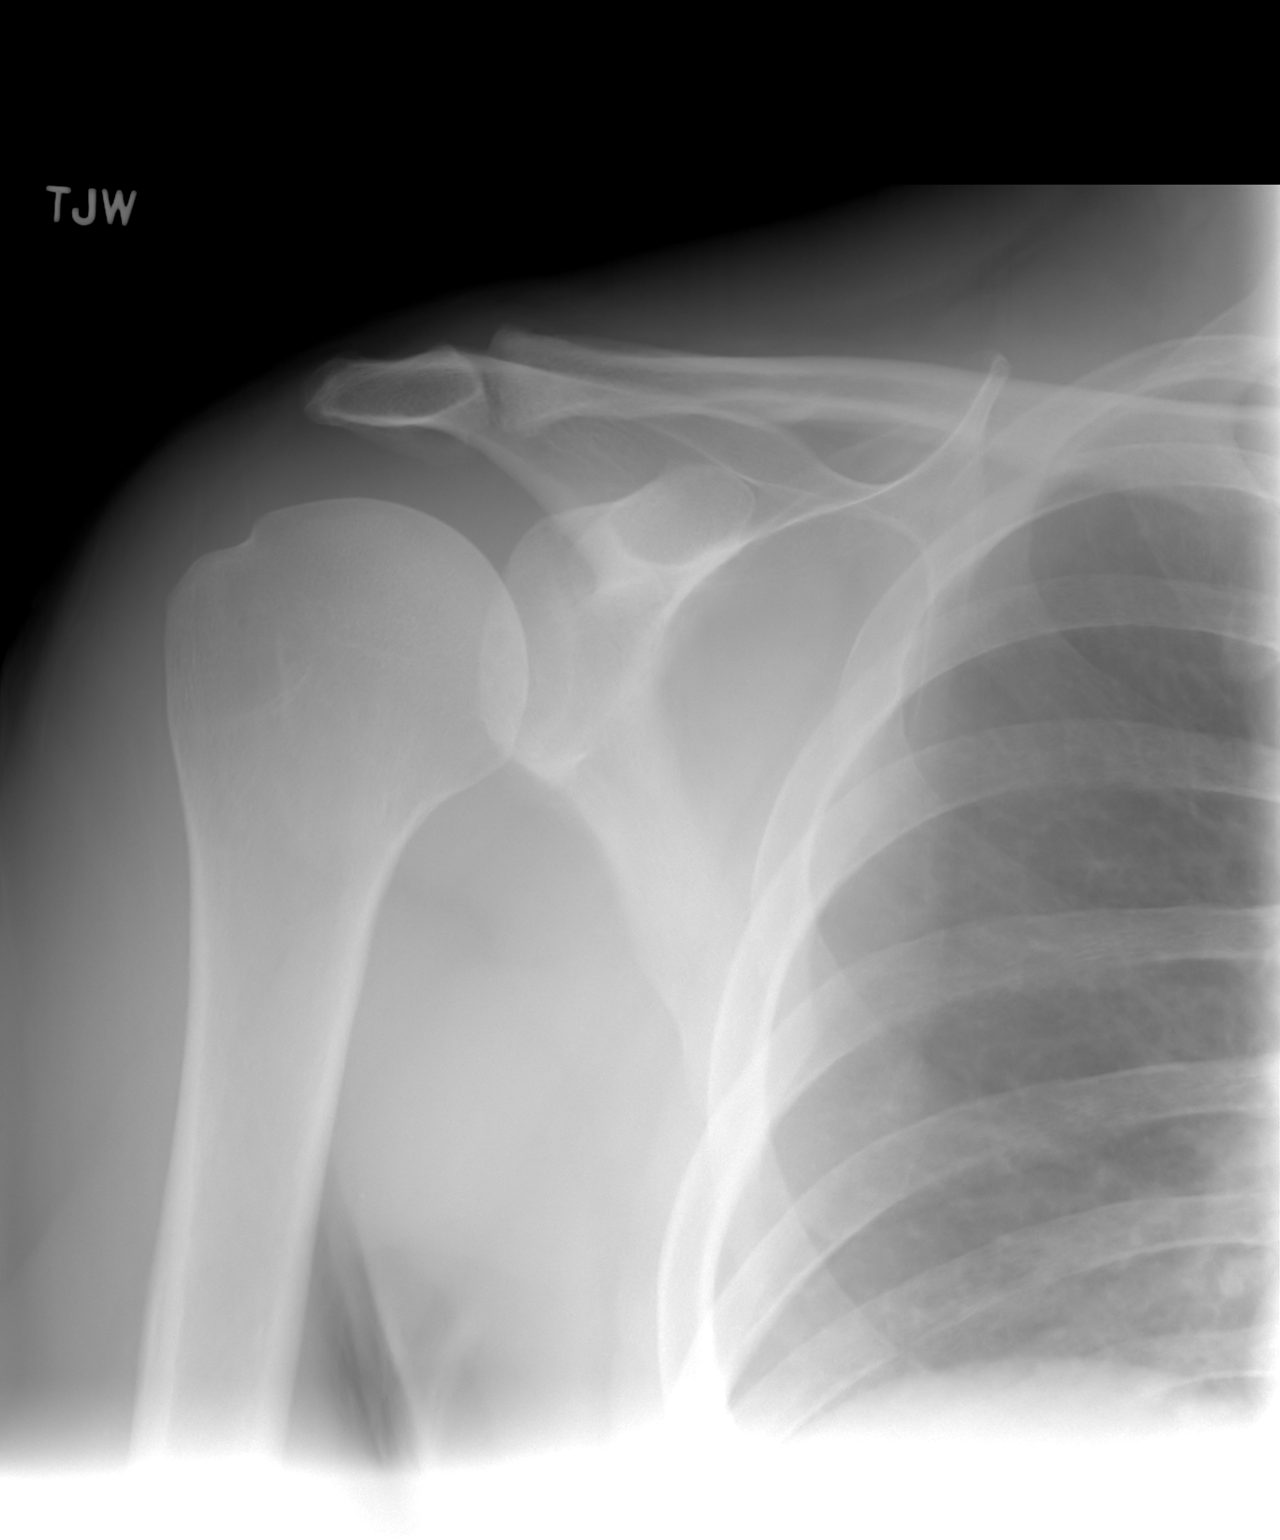

[view not recorded (3 of 3)]
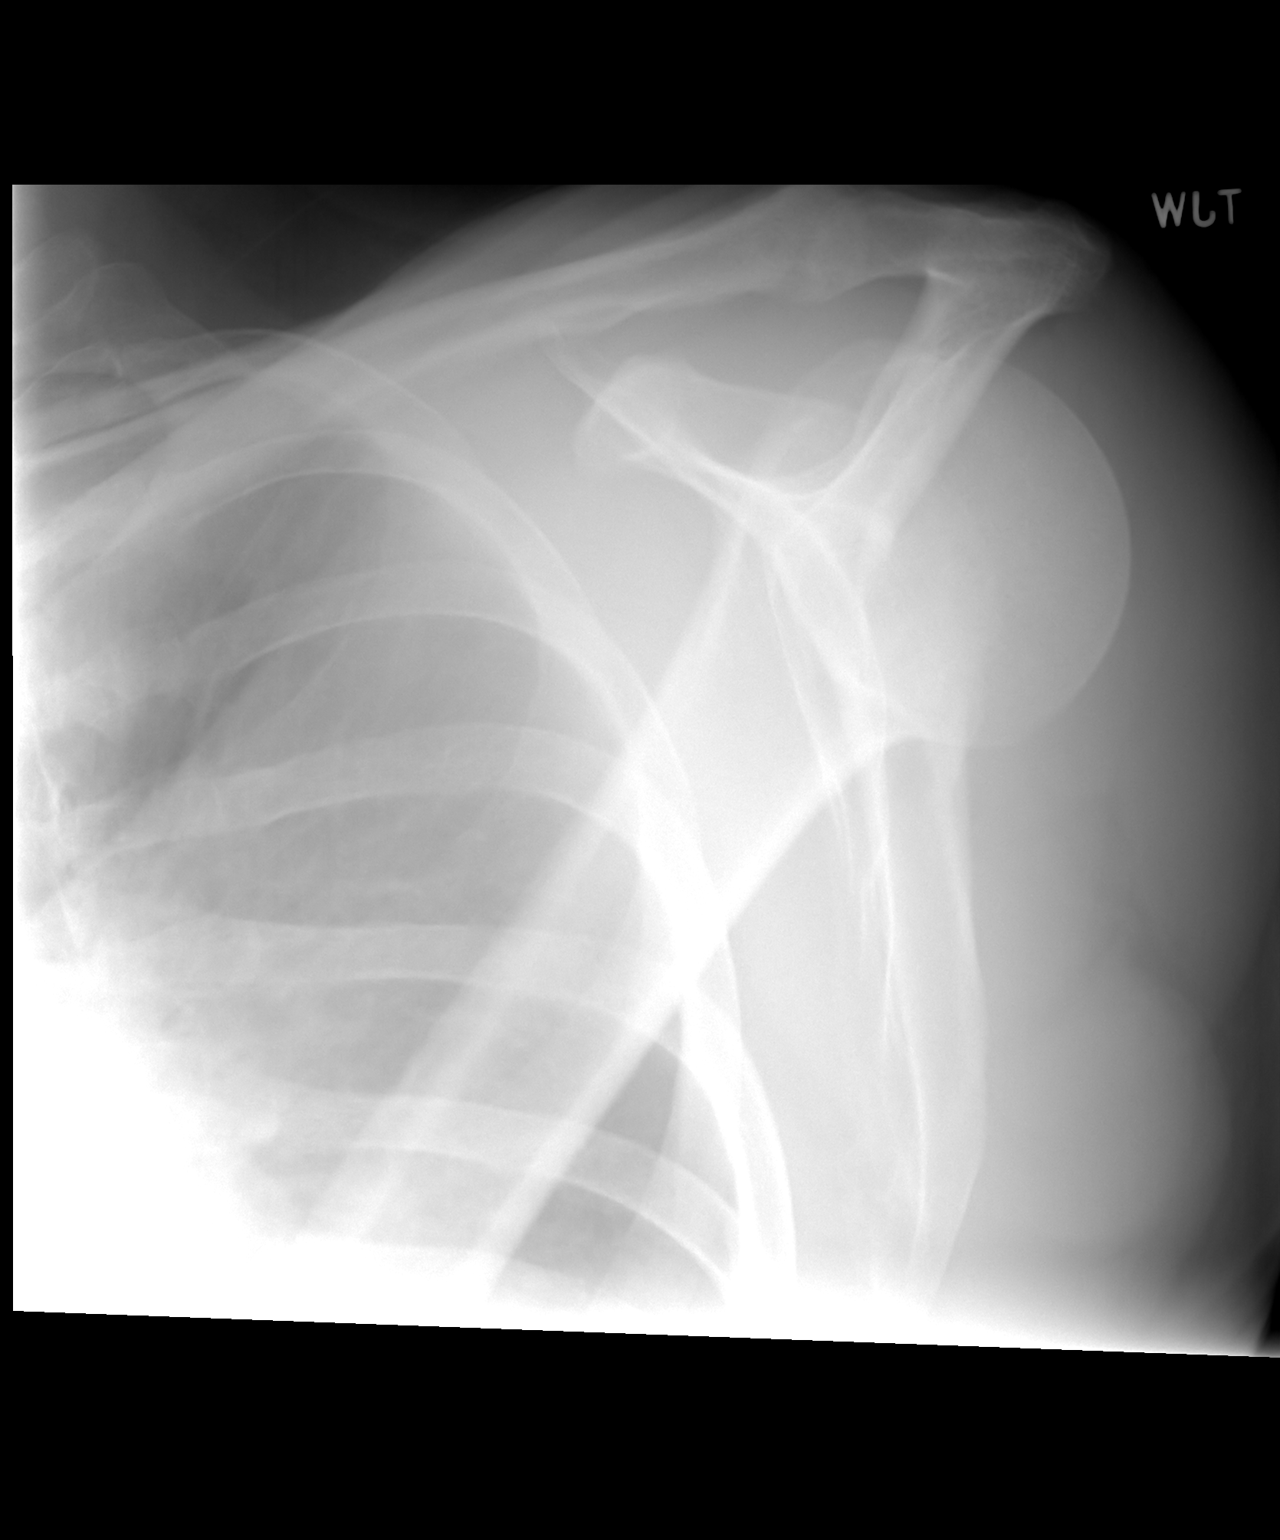

[3 of 3 positions shown; findings below may reference images not displayed]

FINDINGS: There is no evidence of fracture or dislocation.  There
is no evidence of arthropathy or other focal bone abnormality.
Soft tissues are unremarkable.
IMPRESSION: Negative.

## 2013-07-04 ENCOUNTER — Telehealth: Payer: Self-pay | Admitting: Internal Medicine

## 2013-07-04 MED ORDER — SYNTHROID 137 MCG PO TABS: ORAL_TABLET | ORAL | Status: DC

## 2013-07-04 NOTE — Telephone Encounter (Signed)
Refill sent.

## 2013-07-04 NOTE — Telephone Encounter (Signed)
SYNTHROID 137 MCG tablet #90

## 2013-07-23 ENCOUNTER — Telehealth: Payer: Self-pay | Admitting: Internal Medicine

## 2013-07-23 NOTE — Telephone Encounter (Signed)
Pt dropped off dot cpx paperwork to be filled out  Pt stated he was here in April for cpx  In box

## 2013-07-24 ENCOUNTER — Telehealth: Payer: Self-pay | Admitting: Internal Medicine

## 2013-07-24 NOTE — Telephone Encounter (Signed)
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

## 2013-07-25 NOTE — Telephone Encounter (Signed)
Ok, return to me so I can figure out how to complete it  thanks

## 2013-07-25 NOTE — Telephone Encounter (Signed)
Papers placed in red folder.

## 2013-07-25 NOTE — Telephone Encounter (Signed)
Patient stated he went to [facility] and Dr. Fransico Michael didi full eye exam with visual field. 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

## 2013-07-28 NOTE — Telephone Encounter (Signed)
Delivered back to Dr. Darrick Huntsman

## 2013-07-28 NOTE — Telephone Encounter (Signed)
Patient notified forms ready for pick up and placed up front . Copy sent to billing and scan to chart.

## 2013-10-17 ENCOUNTER — Telehealth: Payer: Self-pay | Admitting: Internal Medicine

## 2013-10-17 ENCOUNTER — Other Ambulatory Visit: Payer: Self-pay

## 2013-10-17 MED ORDER — SYNTHROID 137 MCG PO TABS: ORAL_TABLET | ORAL | Status: DC

## 2013-10-17 NOTE — Telephone Encounter (Signed)
Needs new rx for thyroid medication.

## 2013-11-12 ENCOUNTER — Ambulatory Visit (INDEPENDENT_AMBULATORY_CARE_PROVIDER_SITE_OTHER): Payer: BC Managed Care – PPO | Admitting: Internal Medicine

## 2013-11-12 ENCOUNTER — Encounter: Payer: Self-pay | Admitting: Internal Medicine

## 2013-11-12 VITALS — BP 136/84 | HR 82 | Temp 98.0°F | Resp 16 | Ht 70.5 in | Wt 199.2 lb

## 2013-11-12 DIAGNOSIS — M67919 Unspecified disorder of synovium and tendon, unspecified shoulder: Secondary | ICD-10-CM

## 2013-11-12 DIAGNOSIS — M719 Bursopathy, unspecified: Secondary | ICD-10-CM

## 2013-11-12 DIAGNOSIS — E039 Hypothyroidism, unspecified: Secondary | ICD-10-CM

## 2013-11-12 DIAGNOSIS — J309 Allergic rhinitis, unspecified: Secondary | ICD-10-CM

## 2013-11-12 DIAGNOSIS — M7552 Bursitis of left shoulder: Secondary | ICD-10-CM

## 2013-11-12 DIAGNOSIS — L723 Sebaceous cyst: Secondary | ICD-10-CM

## 2013-11-12 DIAGNOSIS — Z0000 Encounter for general adult medical examination without abnormal findings: Secondary | ICD-10-CM

## 2013-11-12 LAB — POCT URINALYSIS DIPSTICK
BILIRUBIN UA: NEGATIVE
Glucose, UA: NEGATIVE
Ketones, UA: NEGATIVE
Leukocytes, UA: NEGATIVE
NITRITE UA: NEGATIVE
PH UA: 6
PROTEIN UA: NEGATIVE
Spec Grav, UA: 1.01
Urobilinogen, UA: 0.2

## 2013-11-12 MED ORDER — MOMETASONE FUROATE 50 MCG/ACT NA SUSP: 2.0000 | Freq: Every day | NASAL | Status: DC

## 2013-11-12 NOTE — Patient Instructions (Addendum)
I am prescribing a steroid nasal spray to use daily for your persistent congestion   Try using vaseline to moisturize  The inside of your nose.  Flush with Simply salien to remove particulate matter   Return for fasting labs in April and OV to discuss a few days later   The place on your back is a sebaceous cyst, and  Does not have to be addressed unless it becomes enflamed

## 2013-11-15 ENCOUNTER — Encounter: Payer: Self-pay | Admitting: Internal Medicine

## 2013-11-15 DIAGNOSIS — L723 Sebaceous cyst: Secondary | ICD-10-CM | POA: Insufficient documentation

## 2013-11-15 DIAGNOSIS — J309 Allergic rhinitis, unspecified: Secondary | ICD-10-CM | POA: Insufficient documentation

## 2013-11-15 NOTE — Assessment & Plan Note (Signed)
Thyroid function is WNL on current dose.  No current changes needed.   Lab Results  Component Value Date   TSH 1.700 02/12/2013

## 2013-11-15 NOTE — Assessment & Plan Note (Signed)
Trial of steroid nasal spray. Use petroleum jelly for moisturizing the mucosa and preventing leading.

## 2013-11-15 NOTE — Assessment & Plan Note (Signed)
Small noted on mid back. No workup needed

## 2013-11-15 NOTE — Assessment & Plan Note (Signed)
Improved with nonsteroidal anti-inflammatory and PT.

## 2013-11-15 NOTE — Progress Notes (Signed)
Patient ID: Tim Owen, male   DOB: 1966-04-25, 48 y.o.   MRN: [id number]  Patient Active Problem List   Diagnosis Date Noted  . Sebaceous cyst 11/15/2013  . Allergic rhinitis 11/15/2013  . Routine general medical examination at a health care facility 02/14/2013  . Bursitis of left shoulder 02/14/2013  . 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 Impotence 04/08/2012  . Closed left malleolar fracture 04/08/2012  . Hypothyroidism (acquired) 10/08/2011    Subjective:  CC:   Chief Complaint  Patient presents with  . Annual Exam    needs paper work for work physical.    HPI:   Tim Owen a 48 y.o. male who presents for followup on chronic conditions including hypothyroidism and shoulder pain. He feels generally well. He has been having some sinus congestion drainage with occasional Blood streaked clear discharge.  Denies fevers, facial pain and ear pain.. His previous shoulder pain  did improve with physical therapy. He continues to see a dermatologist once a year for management of atypical nevi.His wife is concerned about a swelling on his mid back.   Past Medical History  Diagnosis Date  . hypothyroidism     Past Surgical History  Procedure Laterality Date  . 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 Knee arthroscopy  2003    right , with residual numbness medial side of calf       The following portions of the patient's history were reviewed and updated as appropriate: Allergies, current medications, and problem list.    Review of Systems:   12 Pt  review of systems was negative except those addressed in the HPI,     History   Social History  . Marital Status: Married    Spouse Name: N/A    Number of Children: N/A  . Years of Education: N/A   Occupational History  . Not on file.   Social History Main Topics  . Smoking status: Former Smoker    Types: Cigarettes    Quit date: 01/02/2011  . Smokeless tobacco: Never  Used  . Alcohol Use: 7.2 oz/week    12 Cans of beer per week  . Drug Use: No  . Sexual Activity: Not on file   Other Topics Concern  . Not on file   Social History Narrative  . No narrative on file    Objective:  Filed Vitals:   11/12/13 1535  BP: 136/84  Pulse: 82  Temp: 98 F (36.7 C)  Resp: 16     General appearance: alert, cooperative and appears stated age Ears: normal TM's and external ear canals both ears Throat: lips, mucosa, and tongue normal; teeth and gums normal Neck: no adenopathy, no carotid bruit, supple, symmetrical, trachea midline and thyroid not enlarged, symmetric, no tenderness/mass/nodules Back: symmetric, no curvature. ROM normal. No CVA tenderness. Lungs: clear to auscultation bilaterally Heart: regular rate and rhythm, S1, S2 normal, no murmur, click, rub or gallop Abdomen: soft, non-tender; bowel sounds normal; no masses,  no organomegaly Pulses: 2+ and symmetric Skin: Skin color, texture, turgor normal. No rashes or lesions Lymph nodes: Cervical, supraclavicular, and axillary nodes normal.  Assessment and Plan:  Hypothyroidism (acquired) Thyroid function is WNL on current dose.  No current changes needed.   Lab Results  Component Value Date   TSH 1.700 02/12/2013    Bursitis of left shoulder Improved with nonsteroidal anti-inflammatory and PT.   Sebaceous cyst Small noted  on mid back. No workup needed  Allergic rhinitis Trial of steroid nasal spray. Use petroleum jelly for moisturizing the mucosa and preventing leading.   A total of 25 minutes was spent with patient more than half of which was spent in evaluating patient, reviewing records  filling out the several page physical exam form needed for his work.  Updated Medication List Outpatient Encounter Prescriptions as of 11/12/2013  Medication Sig  . ALPRAZolam (XANAX) 0.25 MG tablet Take 1 tablet (0.25 mg total) by mouth at bedtime as needed.  [name] SYNTHROID 137 MCG tablet Take  1 tablet by mouth  daily  . meloxicam (MOBIC) 15 MG tablet Take 1 tablet (15 mg total) by mouth daily.  . mometasone (NASONEX) 50 MCG/ACT nasal spray Place 2 sprays into the nose daily.  . [DISCONTINUED] traMADol (ULTRAM) 50 MG tablet Take 1 tablet (50 mg total) by mouth every 8 (eight) hours as needed for pain.     Orders Placed This Encounter  Procedures  . POCT Urinalysis Dipstick    No Follow-up on file.

## 2014-02-06 ENCOUNTER — Telehealth: Payer: Self-pay | Admitting: *Deleted

## 2014-02-06 DIAGNOSIS — Z79899 Other long term (current) drug therapy: Secondary | ICD-10-CM

## 2014-02-06 DIAGNOSIS — E039 Hypothyroidism, unspecified: Secondary | ICD-10-CM

## 2014-02-06 DIAGNOSIS — R5383 Other fatigue: Secondary | ICD-10-CM

## 2014-02-06 DIAGNOSIS — E785 Hyperlipidemia, unspecified: Secondary | ICD-10-CM

## 2014-02-06 DIAGNOSIS — R5381 Other malaise: Secondary | ICD-10-CM

## 2014-02-06 NOTE — Telephone Encounter (Signed)
Pt is coming in on Monday what labs and dx? 

## 2014-02-09 ENCOUNTER — Other Ambulatory Visit (INDEPENDENT_AMBULATORY_CARE_PROVIDER_SITE_OTHER): Payer: BC Managed Care – PPO

## 2014-02-09 DIAGNOSIS — Z79899 Other long term (current) drug therapy: Secondary | ICD-10-CM

## 2014-02-09 DIAGNOSIS — E785 Hyperlipidemia, unspecified: Secondary | ICD-10-CM

## 2014-02-09 DIAGNOSIS — R5381 Other malaise: Secondary | ICD-10-CM

## 2014-02-09 DIAGNOSIS — E039 Hypothyroidism, unspecified: Secondary | ICD-10-CM

## 2014-02-09 DIAGNOSIS — R5383 Other fatigue: Principal | ICD-10-CM

## 2014-02-09 LAB — CBC WITH DIFFERENTIAL/PLATELET
BASOS ABS: 0 10*3/uL (ref 0.0–0.1)
Basophils Relative: 0.3 % (ref 0.0–3.0)
EOS ABS: 0.2 10*3/uL (ref 0.0–0.7)
Eosinophils Relative: 2.8 % (ref 0.0–5.0)
HEMATOCRIT: 44.2 % (ref 39.0–52.0)
HEMOGLOBIN: 15.1 g/dL (ref 13.0–17.0)
LYMPHS ABS: 1.8 10*3/uL (ref 0.7–4.0)
Lymphocytes Relative: 25.6 % (ref 12.0–46.0)
MCHC: 34.1 g/dL (ref 30.0–36.0)
MCV: 98.6 fl (ref 78.0–100.0)
Monocytes Absolute: 0.6 10*3/uL (ref 0.1–1.0)
Monocytes Relative: 8.8 % (ref 3.0–12.0)
NEUTROS ABS: 4.5 10*3/uL (ref 1.4–7.7)
Neutrophils Relative %: 62.5 % (ref 43.0–77.0)
Platelets: 228 10*3/uL (ref 150.0–400.0)
RBC: 4.48 Mil/uL (ref 4.22–5.81)
RDW: 13.1 % (ref 11.5–14.6)
WBC: 7.2 10*3/uL (ref 4.5–10.5)

## 2014-02-09 LAB — COMPREHENSIVE METABOLIC PANEL
ALK PHOS: 46 U/L (ref 39–117)
ALT: 23 U/L (ref 0–53)
AST: 20 U/L (ref 0–37)
Albumin: 4.3 g/dL (ref 3.5–5.2)
BILIRUBIN TOTAL: 0.4 mg/dL (ref 0.3–1.2)
BUN: 15 mg/dL (ref 6–23)
CO2: 26 mEq/L (ref 19–32)
CREATININE: 1.3 mg/dL (ref 0.4–1.5)
Calcium: 9.4 mg/dL (ref 8.4–10.5)
Chloride: 104 mEq/L (ref 96–112)
GFR: 64.69 mL/min (ref >60.00)
Glucose, Bld: 99 mg/dL (ref 70–99)
Potassium: 4.5 mEq/L (ref 3.5–5.1)
Sodium: 139 mEq/L (ref 135–145)
Total Protein: 6.9 g/dL (ref 6.0–8.3)

## 2014-02-09 LAB — LIPID PANEL
CHOL/HDL RATIO: 6
Cholesterol: 188 mg/dL (ref 0–200)
HDL: 32.8 mg/dL — ABNORMAL LOW (ref >39.00)
LDL CALC: 114 mg/dL — AB (ref 0–99)
Triglycerides: 208 mg/dL — ABNORMAL HIGH (ref 0.0–149.0)
VLDL: 41.6 mg/dL — ABNORMAL HIGH (ref 0.0–40.0)

## 2014-02-09 LAB — TSH: TSH: 2.38 u[IU]/mL (ref 0.35–5.50)

## 2014-02-10 ENCOUNTER — Encounter: Payer: Self-pay | Admitting: *Deleted

## 2014-02-11 ENCOUNTER — Encounter: Payer: Self-pay | Admitting: Internal Medicine

## 2014-02-11 ENCOUNTER — Ambulatory Visit (INDEPENDENT_AMBULATORY_CARE_PROVIDER_SITE_OTHER): Payer: BC Managed Care – PPO | Admitting: Internal Medicine

## 2014-02-11 VITALS — BP 122/84 | HR 67 | Temp 97.6°F | Resp 16 | Wt 196.0 lb

## 2014-02-11 DIAGNOSIS — F411 Generalized anxiety disorder: Secondary | ICD-10-CM

## 2014-02-11 DIAGNOSIS — E785 Hyperlipidemia, unspecified: Secondary | ICD-10-CM

## 2014-02-11 NOTE — Progress Notes (Signed)
Pre-visit discussion using our clinic review tool. No additional management support is needed unless otherwise documented below in the visit note.  

## 2014-02-11 NOTE — Patient Instructions (Signed)
Your cholesterol does not require medication.  Your triglycerides and HDL can be improved with a Mediterranean style diet  If you find yourself needing the alprazolam on a daily basis,  Talk to me about a safer alternative to help you manage stress  Excessive gas after meals is often caused by ;    Eating too fast   Taking in too much air with every bite    Vegetables!!  (try Beano with ever meal)   You can try mylanta Gas,  Gas X ,  Or Phasyme  For management

## 2014-02-14 DIAGNOSIS — E785 Hyperlipidemia, unspecified: Secondary | ICD-10-CM | POA: Insufficient documentation

## 2014-02-14 DIAGNOSIS — F411 Generalized anxiety disorder: Secondary | ICD-10-CM | POA: Insufficient documentation

## 2014-02-14 NOTE — Progress Notes (Signed)
Patient ID: Tim Owen, male   DOB: August 07, 1966, 48 y.o.   MRN: [id number]  Patient Active Problem List   Diagnosis Date Noted  . Anxiety state, unspecified 02/14/2014  . Other and unspecified hyperlipidemia 02/14/2014  . Sebaceous cyst 11/15/2013  . Allergic rhinitis 11/15/2013  . Routine general medical examination at a health care facility 02/14/2013  . Bursitis of left shoulder 02/14/2013  . 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 Impotence 04/08/2012  . Closed left malleolar fracture 04/08/2012  . Hypothyroidism (acquired) 10/08/2011    Subjective:  CC:   Chief Complaint  Patient presents with  . Follow-up    3 month    HPI:   Tim Owen is a 48 y.o. male who presents for 3 month follow up on chronic conditions including including hyperlipidemia , new onset anxiety.  He feels generally well. He has no new complaints. He continues to have episodes of shoulder pain which have improved with physical therapy. He continues to see a dermatologist once a year for management of atypical nevi. He has been using the alprazolam as needed for anxiety and insomnia.     Past Medical History  Diagnosis Date  . hypothyroidism     Past Surgical History  Procedure Laterality Date  . 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 Knee arthroscopy  2003    right , with residual numbness medial side of calf       The following portions of the patient's history were reviewed and updated as appropriate: Allergies, current medications, and problem list.    Review of Systems:   Patient denies headache, fevers, malaise, unintentional weight loss, skin rash, eye pain, sinus congestion and sinus pain, sore throat, dysphagia,  hemoptysis , cough, dyspnea, wheezing, chest pain, palpitations, orthopnea, edema, abdominal pain, nausea, melena, diarrhea, constipation, flank pain, dysuria, hematuria, urinary  Frequency, nocturia, numbness, tingling, seizures,   Focal weakness, Loss of consciousness,  Tremor, insomnia, depression, anxiety, and suicidal ideation.     History   Social History  . Marital Status: Married    Spouse Name: N/A    Number of Children: N/A  . Years of Education: N/A   Occupational History  . Not on file.   Social History Main Topics  . Smoking status: Former Smoker    Types: Cigarettes    Quit date: 01/02/2011  . Smokeless tobacco: Never Used  . Alcohol Use: 7.2 oz/week    12 Cans of beer per week  . Drug Use: No  . Sexual Activity: Not on file   Other Topics Concern  . Not on file   Social History Narrative  . No narrative on file    Objective:  Filed Vitals:   02/11/14 1537  BP: 122/84  Pulse: 67  Temp: 97.6 F (36.4 C)  Resp: 16     General appearance: alert, cooperative and appears stated age Ears: normal TM's and external ear canals both ears Throat: lips, mucosa, and tongue normal; teeth and gums normal Neck: no adenopathy, no carotid bruit, supple, symmetrical, trachea midline and thyroid not enlarged, symmetric, no tenderness/mass/nodules Back: symmetric, no curvature. ROM normal. No CVA tenderness. Lungs: clear to auscultation bilaterally Heart: regular rate and rhythm, S1, S2 normal, no murmur, click, rub or gallop Abdomen: soft, non-tender; bowel sounds normal; no masses,  no organomegaly Pulses: 2+ and symmetric Skin: Skin color, texture, turgor normal. No rashes or lesions Lymph nodes: Cervical, supraclavicular, and  axillary nodes normal.  Assessment and Plan:  Anxiety state, unspecified Aggravated by work and home stressors..  His prn use of alprazolam has been minimal and he does not want medication for daily use  Other and unspecified hyperlipidemia Mild.  Discussed Mediterranean diet and exercise.  Repeat in 6 months.  A total of 25 minutes of face to face time was spent with patient more than half of which was spent in counselling and coordination of care   Updated  Medication List Outpatient Encounter Prescriptions as of 02/11/2014  Medication Sig  . ALPRAZolam (XANAX) 0.25 MG tablet Take 1 tablet (0.25 mg total) by mouth at bedtime as needed.  . mometasone (NASONEX) 50 MCG/ACT nasal spray Place 2 sprays into the nose daily.  [name] SYNTHROID 137 MCG tablet Take 1 tablet by mouth  daily  . [DISCONTINUED] meloxicam (MOBIC) 15 MG tablet Take 1 tablet (15 mg total) by mouth daily.     No orders of the defined types were placed in this encounter.    No Follow-up on file.

## 2014-02-14 NOTE — Assessment & Plan Note (Signed)
Mild.  Discussed Mediterranean diet and exercise.  Repeat in 6 months.

## 2014-02-14 NOTE — Assessment & Plan Note (Signed)
Aggravated by work and home stressors..  His prn use of alprazolam has been minimal and he does not want medication for daily use

## 2014-03-18 ENCOUNTER — Telehealth: Payer: Self-pay | Admitting: Internal Medicine

## 2014-03-18 NOTE — Telephone Encounter (Signed)
error 

## 2014-03-19 ENCOUNTER — Ambulatory Visit (INDEPENDENT_AMBULATORY_CARE_PROVIDER_SITE_OTHER): Payer: BC Managed Care – PPO | Admitting: Internal Medicine

## 2014-03-19 ENCOUNTER — Encounter (INDEPENDENT_AMBULATORY_CARE_PROVIDER_SITE_OTHER): Payer: Self-pay

## 2014-03-19 ENCOUNTER — Encounter: Payer: Self-pay | Admitting: Internal Medicine

## 2014-03-19 VITALS — BP 120/88 | HR 74 | Temp 97.9°F | Resp 16 | Ht 70.5 in | Wt 190.5 lb

## 2014-03-19 DIAGNOSIS — L02429 Furuncle of limb, unspecified: Secondary | ICD-10-CM

## 2014-03-19 MED ORDER — CHLORHEXIDINE GLUCONATE 4 % EX LIQD: CUTANEOUS | Status: DC

## 2014-03-19 MED ORDER — NYSTATIN 100000 UNIT/GM EX POWD: Freq: Two times a day (BID) | CUTANEOUS | Status: DC

## 2014-03-19 MED ORDER — SULFAMETHOXAZOLE-TRIMETHOPRIM 800-160 MG PO TABS: 1.0000 | ORAL_TABLET | Freq: Two times a day (BID) | ORAL | Status: DC

## 2014-03-19 NOTE — Progress Notes (Signed)
Pre-visit discussion using our clinic review tool. No additional management support is needed unless otherwise documented below in the visit note.  

## 2014-03-19 NOTE — Patient Instructions (Signed)
Abscess An abscess is an infected area that contains a collection of pus and debris.It can occur in almost any part of the body. An abscess is also known as a furuncle or boil. CAUSES  An abscess occurs when tissue gets infected. This can occur from blockage of oil or sweat glands, infection of hair follicles, or a minor injury to the skin. As the body tries to fight the infection, pus collects in the area and creates pressure under the skin. This pressure causes pain. People with weakened immune systems have difficulty fighting infections and get certain abscesses more often.  SYMPTOMS Usually an abscess develops on the skin and becomes a painful mass that is red, warm, and tender. If the abscess forms under the skin, you may feel a moveable soft area under the skin. Some abscesses break open (rupture) on their own, but most will continue to get worse without care. The infection can spread deeper into the body and eventually into the bloodstream, causing you to feel ill.  DIAGNOSIS  Your caregiver will take your medical history and perform a physical exam. A sample of fluid may also be taken from the abscess to determine what is causing your infection. TREATMENT  Your caregiver may prescribe antibiotic medicines to fight the infection. However, taking antibiotics alone usually does not cure an abscess. Your caregiver may need to make a small cut (incision) in the abscess to drain the pus. In some cases, gauze is packed into the abscess to reduce pain and to continue draining the area. HOME CARE INSTRUCTIONS   Only take over-the-counter or prescription medicines for pain, discomfort, or fever as directed by your caregiver.  If you were prescribed antibiotics, take them as directed. Finish them even if you start to feel better.  If gauze is used, follow your caregiver's directions for changing the gauze.  To avoid spreading the infection:  Keep your draining abscess covered with a  bandage.  Wash your hands well.  Do not share personal care items, towels, or whirlpools with others.  Avoid skin contact with others.  Keep your skin and clothes clean around the abscess.  Keep all follow-up appointments as directed by your caregiver. SEEK MEDICAL CARE IF:   You have increased pain, swelling, redness, fluid drainage, or bleeding.  You have muscle aches, chills, or a general ill feeling.  You have a fever. MAKE SURE YOU:   Understand these instructions.  Will watch your condition.  Will get help right away if you are not doing well or get worse. Document Released: 07/26/2005 Document Revised: 04/16/2012 Document Reviewed: 12/29/2011 [employer] Patient Information 2014 [employer].  

## 2014-03-19 NOTE — Progress Notes (Signed)
Patient ID: Tim Owen, male   DOB: [birth year], 48 y.o.   MRN: [id number]    Patient Active Problem List   Diagnosis Date Noted  . Boil, thigh 03/22/2014  . Anxiety state, unspecified 02/14/2014  . Other and unspecified hyperlipidemia 02/14/2014  . Sebaceous cyst 11/15/2013  . Allergic rhinitis 11/15/2013  . Routine general medical examination at a health care facility 02/14/2013  . Bursitis of left shoulder 02/14/2013  . 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 Impotence 04/08/2012  . Closed left malleolar fracture 04/08/2012  . Hypothyroidism (acquired) 10/08/2011    Subjective:  CC:   Chief Complaint  Patient presents with  . Acute Visit    Inner left thigh next to scrotum area    HPI:   Tim Owen is a 49 y.o. male who presents for Tender red boil on inner aspect of left thigh,  Worse a few days ago.  Occurs every summer when he sweats a lot,  No fevers.  Not spontaneously draining but prior episodes have drained. Using Boil Eze for the past 3 days   Past Medical History  Diagnosis Date  . hypothyroidism     Past Surgical History  Procedure Laterality Date  . 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 Knee arthroscopy  2003    right , with residual numbness medial side of calf       The following portions of the patient's history were reviewed and updated as appropriate: Allergies, current medications, and problem list.    Review of Systems:   Patient denies headache, fevers, malaise, unintentional weight loss, skin rash, eye pain, sinus congestion and sinus pain, sore throat, dysphagia,  hemoptysis , cough, dyspnea, wheezing, chest pain, palpitations, orthopnea, edema, abdominal pain, nausea, melena, diarrhea, constipation, flank pain, dysuria, hematuria, urinary  Frequency, nocturia, numbness, tingling, seizures,  Focal weakness, Loss of consciousness,  Tremor, insomnia, depression, anxiety, and suicidal ideation.      History   Social History  . Marital Status: Married    Spouse Name: N/A    Number of Children: N/A  . Years of Education: N/A   Occupational History  . Not on file.   Social History Main Topics  . Smoking status: Former Smoker    Types: Cigarettes    Quit date: 01/02/2011  . Smokeless tobacco: Never Used  . Alcohol Use: 7.2 oz/week    12 Cans of beer per week  . Drug Use: No  . Sexual Activity: Not on file   Other Topics Concern  . Not on file   Social History Narrative  . No narrative on file    Objective:  Filed Vitals:   03/19/14 0854  BP: 120/88  Pulse: 74  Temp: 97.9 F (36.6 C)  Resp: 16     General appearance: alert, cooperative and appears stated age Ears: normal TM's and external ear canals both ears Throat: lips, mucosa, and tongue normal; teeth and gums normal Neck: no adenopathy, no carotid bruit, supple, symmetrical, trachea midline and thyroid not enlarged, symmetric, no tenderness/mass/nodules Back: symmetric, no curvature. ROM normal. No CVA tenderness. Lungs: clear to auscultation bilaterally Heart: regular rate and rhythm, S1, S2 normal, no murmur, click, rub or gallop Abdomen: soft, non-tender; bowel sounds normal; no masses,  no organomegaly Pulses: 2+ and symmetric Skin: tende redboil on medial inner thigh on left thigh  Lymph nodes: Cervical, supraclavicular, and axillary nodes normal.  Assessment and Plan:  Boil, thigh not fluctuant.  Empiric treatment for Staph Aureus.  Given its recurrence by history, recommended anti  Microbial sap daily and once a week Hibiclens.    Updated Medication List Outpatient Encounter Prescriptions as of 03/19/2014  Medication Sig  . ALPRAZolam (XANAX) 0.25 MG tablet Take 1 tablet (0.25 mg total) by mouth at bedtime as needed.  . mometasone (NASONEX) 50 MCG/ACT nasal spray Place 2 sprays into the nose daily.  [name] SYNTHROID 137 MCG tablet Take 1 tablet by mouth  daily  . chlorhexidine (HIBICLENS) 4  % external liquid Use once a week during the summer months to legs and scrotum  . nystatin (MYCOSTATIN) powder Apply topically 2 (two) times daily. To irritated skin folds  . sulfamethoxazole-trimethoprim (SEPTRA DS) 800-160 MG per tablet Take 1 tablet by mouth 2 (two) times daily.     No orders of the defined types were placed in this encounter.    No Follow-up on file.

## 2014-03-22 DIAGNOSIS — L02429 Furuncle of limb, unspecified: Secondary | ICD-10-CM | POA: Insufficient documentation

## 2014-03-22 NOTE — Assessment & Plan Note (Signed)
not fluctuant.  Empiric treatment for Staph Aureus.  Given its recurrence by history, recommended anti  Microbial sap daily and once a week Hibiclens.

## 2014-04-23 ENCOUNTER — Other Ambulatory Visit: Payer: Self-pay | Admitting: *Deleted

## 2014-04-23 MED ORDER — SYNTHROID 137 MCG PO TABS: ORAL_TABLET | ORAL | Status: DC

## 2014-04-23 NOTE — Progress Notes (Signed)
Patient called left message for refill on Synthroid refill sent as requested.

## 2014-09-16 ENCOUNTER — Encounter: Payer: Self-pay | Admitting: Internal Medicine

## 2014-09-16 ENCOUNTER — Ambulatory Visit (INDEPENDENT_AMBULATORY_CARE_PROVIDER_SITE_OTHER): Payer: BC Managed Care – PPO | Admitting: Internal Medicine

## 2014-09-16 ENCOUNTER — Ambulatory Visit (INDEPENDENT_AMBULATORY_CARE_PROVIDER_SITE_OTHER)
Admission: RE | Admit: 2014-09-16 | Discharge: 2014-09-16 | Disposition: A | Discharge status: Home / Self Care | Payer: BC Managed Care – PPO | Source: Ambulatory Visit | Attending: Internal Medicine | Admitting: Internal Medicine

## 2014-09-16 ENCOUNTER — Telehealth: Payer: Self-pay | Admitting: *Deleted

## 2014-09-16 VITALS — BP 120/86 | HR 76 | Temp 97.7°F | Resp 16 | Ht 70.5 in | Wt 198.5 lb

## 2014-09-16 DIAGNOSIS — M542 Cervicalgia: Secondary | ICD-10-CM

## 2014-09-16 MED ORDER — PREDNISONE (PAK) 10 MG PO TABS: ORAL_TABLET | ORAL | Status: DC

## 2014-09-16 NOTE — Patient Instructions (Signed)
Your pain may be coming from a pinched nervel in your neck  Prednisone taper ,  Pain x rays of your cervical spine to  be done at [facility]  If no improvement,  We'll get an MRI neck

## 2014-09-16 NOTE — Progress Notes (Signed)
Pre-visit discussion using our clinic review tool. No additional management support is needed unless otherwise documented below in the visit note. 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

## 2014-09-16 NOTE — Progress Notes (Signed)
Patient ID: Tim Owen, male   DOB: 06/12/1966, 48 y.o.   MRN: [id number]  Patient Active Problem List   Diagnosis Date Noted  . Cervicalgia of occipito-atlanto-axial region 09/19/2014  . Boil, thigh 03/22/2014  . Anxiety state, unspecified 02/14/2014  . Other and unspecified hyperlipidemia 02/14/2014  . Sebaceous cyst 11/15/2013  . Allergic rhinitis 11/15/2013  . Routine general medical examination at a health care facility 02/14/2013  . Bursitis of left shoulder 02/14/2013  . 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 Impotence 04/08/2012  . Closed left malleolar fracture 04/08/2012  . Hypothyroidism (acquired) 10/08/2011    Subjective:  CC:   Chief Complaint  Patient presents with  . Acute Visit    headache since sunday, feels most of the pain is down in the ear.    HPI:   Tim Owen is a 48 y.o. male who presents for an Urgent work in for head pain . Went to Urgent care for headache and told to go to ER because he might have an aneurysm, which he did not want to do,  History :  On Oct 25 l  blunt trauma to top of head,  Banged it on the  corner of an of [facility] unit and had a puncture which bled.  Had neck pain for a week .  Currently having shooting pains to side of head and ear on the right side.    Past Medical History  Diagnosis Date  . hypothyroidism     Past Surgical History  Procedure Laterality Date  . 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 Knee arthroscopy  2003    right , with residual numbness medial side of calf       The following portions of the patient's history were reviewed and updated as appropriate: Allergies, current medications, and problem list.    Review of Systems:   Patient denies headache, fevers, malaise, unintentional weight loss, skin rash, eye pain, sinus congestion and sinus pain, sore throat, dysphagia,  hemoptysis , cough, dyspnea, wheezing, chest pain, palpitations, orthopnea, edema,  abdominal pain, nausea, melena, diarrhea, constipation, flank pain, dysuria, hematuria, urinary  Frequency, nocturia, numbness, tingling, seizures,  Focal weakness, Loss of consciousness,  Tremor, insomnia, depression, anxiety, and suicidal ideation.     History   Social History  . Marital Status: Married    Spouse Name: N/A    Number of Children: N/A  . Years of Education: N/A   Occupational History  . Not on file.   Social History Main Topics  . Smoking status: Former Smoker    Types: Cigarettes    Quit date: 01/02/2011  . Smokeless tobacco: Never Used  . Alcohol Use: 7.2 oz/week    12 Cans of beer per week  . Drug Use: No  . Sexual Activity: Not on file   Other Topics Concern  . Not on file   Social History Narrative    Objective:  Filed Vitals:   09/16/14 1319  BP: 120/86  Pulse: 76  Temp: 97.7 F (36.5 C)  Resp: 16     General appearance: alert, cooperative and appears stated age Ears: normal TM's and external ear canals both ears Throat: lips, mucosa, and tongue normal; teeth and gums normal Neck: no adenopathy, no carotid bruit, supple, symmetrical, trachea midline and thyroid not enlarged, symmetric, no tenderness/mass/nodules Back: symmetric, no curvature. ROM normal. No CVA tenderness. Lungs: clear to auscultation bilaterally  Heart: regular rate and rhythm, S1, S2 normal, no murmur, click, rub or gallop Abdomen: soft, non-tender; bowel sounds normal; no masses,  no organomegaly Pulses: 2+ and symmetric Skin: Skin color, texture, turgor normal. No rashes or lesions Lymph nodes: Cervical, supraclavicular, and axillary nodes normal. Neuro: CNs 2-12 intact. DTRs 2+/4 in biceps, brachioradialis, patellars and achilles. Muscle strength 5/5 in upper and lower exremities. Fine resting tremor bilaterally both hands cerebellar function normal. Romberg negative.  No pronator drift.   Gait normal.    Assessment and Plan:  Cervicalgia of occipito-atlanto-axial  region Caused by recent blunt force trauma to top of head .His cervical spine films  did not show any fracture,  But showed mild degenerative changes to the level of vertebra that affects the shoulder and arm.  If his pain has not improved with the prednisone taper,  i will order an MRI     Updated Medication List Outpatient Encounter Prescriptions as of 09/16/2014  Medication Sig  . ALPRAZolam (XANAX) 0.25 MG tablet Take 1 tablet (0.25 mg total) by mouth at bedtime as needed.  [name] SYNTHROID 137 MCG tablet Take 1 tablet by mouth  daily  . chlorhexidine (HIBICLENS) 4 % external liquid Use once a week during the summer months to legs and scrotum  . mometasone (NASONEX) 50 MCG/ACT nasal spray Place 2 sprays into the nose daily.  [name] nystatin (MYCOSTATIN) powder Apply topically 2 (two) times daily. To irritated skin folds  . predniSONE (STERAPRED UNI-PAK) 10 MG tablet 6 tablets on Day 1 , then reduce by 1 tablet daily until gone  . sulfamethoxazole-trimethoprim (SEPTRA DS) 800-160 MG per tablet Take 1 tablet by mouth 2 (two) times daily.     Orders Placed This Encounter  Procedures  . DG Cervical Spine Complete    No Follow-up on file.

## 2014-09-16 NOTE — Telephone Encounter (Signed)
Patient BP was not elevated at urgent care so they stated to patient but patient did [place] and has been bothering him ever since this happened.

## 2014-09-16 NOTE — Telephone Encounter (Signed)
Pt called states he went to [facility] for right ear pain, advised by them to go to ER for evaluation for possible swollen blood vessels in head.  He further states his head is also tender to touch.  Pt states he has taken Motrin with some relief.  Further states he is concerned because his mother had an aneurysm.  Pt denies dizziness, nausea, vomiting or blurred vision.  Please advise

## 2014-09-16 NOTE — Telephone Encounter (Signed)
please ask Ollen to com in at 1;15 and I'll see him BRIEFLY about his scalp pain

## 2014-09-19 DIAGNOSIS — M542 Cervicalgia: Secondary | ICD-10-CM | POA: Insufficient documentation

## 2014-09-19 NOTE — Assessment & Plan Note (Addendum)
Caused by recent blunt force trauma to top of head .His cervical spine films  did not show any fracture,  But showed mild degenerative changes to the level of vertebra that affects the shoulder and arm.  If his pain has not improved with the prednisone taper,  i will order an MRI

## 2014-10-21 ENCOUNTER — Other Ambulatory Visit: Payer: Self-pay | Admitting: Internal Medicine

## 2015-04-01 ENCOUNTER — Encounter: Payer: Self-pay | Admitting: Internal Medicine

## 2015-04-01 ENCOUNTER — Ambulatory Visit (INDEPENDENT_AMBULATORY_CARE_PROVIDER_SITE_OTHER): Payer: No Typology Code available for payment source | Admitting: Internal Medicine

## 2015-04-01 VITALS — BP 120/84 | HR 82 | Temp 97.5°F | Resp 16 | Ht 70.0 in | Wt 189.8 lb

## 2015-04-01 DIAGNOSIS — J328 Other chronic sinusitis: Secondary | ICD-10-CM

## 2015-04-01 DIAGNOSIS — E038 Other specified hypothyroidism: Secondary | ICD-10-CM

## 2015-04-01 DIAGNOSIS — E034 Atrophy of thyroid (acquired): Secondary | ICD-10-CM | POA: Diagnosis not present

## 2015-04-01 DIAGNOSIS — E785 Hyperlipidemia, unspecified: Secondary | ICD-10-CM

## 2015-04-01 LAB — COMPREHENSIVE METABOLIC PANEL
ALK PHOS: 59 U/L (ref 39–117)
ALT: 23 U/L (ref 0–53)
AST: 20 U/L (ref 0–37)
Albumin: 4.7 g/dL (ref 3.5–5.2)
BILIRUBIN TOTAL: 0.7 mg/dL (ref 0.2–1.2)
BUN: 14 mg/dL (ref 6–23)
CALCIUM: 9.5 mg/dL (ref 8.4–10.5)
CHLORIDE: 103 meq/L (ref 96–112)
CO2: 28 meq/L (ref 19–32)
CREATININE: 1.26 mg/dL (ref 0.40–1.50)
GFR: 64.39 mL/min (ref >60.00)
GLUCOSE: 85 mg/dL (ref 70–99)
Potassium: 4.7 mEq/L (ref 3.5–5.1)
Sodium: 137 mEq/L (ref 135–145)
Total Protein: 6.8 g/dL (ref 6.0–8.3)

## 2015-04-01 LAB — LDL CHOLESTEROL, DIRECT: Direct LDL: 122 mg/dL

## 2015-04-01 MED ORDER — AMOXICILLIN-POT CLAVULANATE 875-125 MG PO TABS: 1.0000 | ORAL_TABLET | Freq: Two times a day (BID) | ORAL | Status: DC

## 2015-04-01 MED ORDER — PREDNISONE 10 MG PO TABS: ORAL_TABLET | ORAL | Status: DC

## 2015-04-01 MED ORDER — ALPRAZOLAM 0.25 MG PO TABS: 0.2500 mg | ORAL_TABLET | Freq: Every evening | ORAL | Status: DC | PRN

## 2015-04-01 NOTE — Progress Notes (Signed)
Subjective:  Patient ID: Tim Owen, male    DOB: [birth year]  Age: 49 y.o. MRN: [id number]  CC: The primary encounter diagnosis was Hyperlipidemia. Diagnoses of Hypothyroidism due to acquired atrophy of thyroid and Other chronic sinusitis were also pertinent to this visit.  HPI Tim Owen presents for persistent sinus issues x 1 month,  And med refill.  Patient is upset and frustrated with the office because he waited two days for a call back from triage. Treated on [employer] by [facility] for viral URI with cough suppressant,  Flonase.  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   blowing nose constantly,  Sinuses are congested and painful,  A lot of post nasal drip causing gagging and coughing  Outpatient Prescriptions Prior to Visit  Medication Sig Dispense Refill  . ALPRAZolam (XANAX) 0.25 MG tablet Take 1 tablet (0.25 mg total) by mouth at bedtime as needed. 30 tablet 3  . SYNTHROID 137 MCG tablet TAKE 1 TABLET BY MOUTH EVERY DAY 90 tablet 1  . chlorhexidine (HIBICLENS) 4 % external liquid Use once a week during the summer months to legs and scrotum (Patient not taking: Reported on 04/01/2015) 120 mL 0  . mometasone (NASONEX) 50 MCG/ACT nasal spray Place 2 sprays into the nose daily. (Patient not taking: Reported on 04/01/2015) 17 g 12  . nystatin (MYCOSTATIN) powder Apply topically 2 (two) times daily. To irritated skin folds (Patient not taking: Reported on 04/01/2015) 15 g 3  . predniSONE (STERAPRED UNI-PAK) 10 MG tablet 6 tablets on Day 1 , then reduce by 1 tablet daily until gone (Patient not taking: Reported on 04/01/2015) 21 tablet 0  . sulfamethoxazole-trimethoprim (SEPTRA DS) 800-160 MG per tablet Take 1 tablet by mouth 2 (two) times daily. (Patient not taking: Reported on 04/01/2015) 14 tablet 0   No facility-administered medications prior to visit.    Review of Systems;  Patient denies headache, fevers, malaise,  unintentional weight loss, skin rash, eye pain, sinus congestion and sinus pain, sore throat, dysphagia,  hemoptysis , cough, dyspnea, wheezing, chest pain, palpitations, orthopnea, edema, abdominal pain, nausea, melena, diarrhea, constipation, flank pain, dysuria, hematuria, urinary  Frequency, nocturia, numbness, tingling, seizures,  Focal weakness, Loss of consciousness,  Tremor, insomnia, depression, anxiety, and suicidal ideation.      Objective:  BP 120/84 mmHg  Pulse 82  Temp(Src) 97.5 F (36.4 C) (Oral)  Resp 16  Ht 5\' 10"  (1.778 m)  Wt 189 lb 12 oz (86.07 kg)  BMI 27.23 kg/m2  SpO2 97%  BP Readings from Last 3 Encounters:  04/01/15 120/84  09/16/14 120/86  03/19/14 120/88    Wt Readings from Last 3 Encounters:  04/01/15 189 lb 12 oz (86.07 kg)  09/16/14 198 lb 8 oz (90.039 kg)  03/19/14 190 lb 8 oz (86.41 kg)    General appearance: alert, cooperative and appears stated age Ears: normal TM's and external ear canals both ears Throat: lips, mucosa, and tongue normal; teeth and gums normal Neck: no adenopathy, no carotid bruit, supple, symmetrical, trachea midline and thyroid not enlarged, symmetric, no tenderness/mass/nodules Back: symmetric, no curvature. ROM normal. No CVA tenderness. Lungs: clear to auscultation bilaterally Heart: regular rate and rhythm, S1, S2 normal, no murmur, click, rub or gallop Abdomen: soft, non-tender; bowel sounds normal; no masses,  no organomegaly Pulses: 2+ and symmetric Skin: Skin color, texture, turgor normal. No rashes or lesions Lymph nodes: Cervical, supraclavicular,  and axillary nodes normal.  Lab Results  Component Value Date   HGBA1C 5.7 08/20/2012    Lab Results  Component Value Date   CREATININE 1.26 04/01/2015   CREATININE 1.3 02/09/2014   CREATININE 1.22 02/12/2013    Lab Results  Component Value Date   WBC 7.2 02/09/2014   HGB 15.1 02/09/2014   HCT 44.2 02/09/2014   PLT 228.0 02/09/2014   GLUCOSE 85  04/01/2015   CHOL 188 02/09/2014   TRIG 208.0* 02/09/2014   HDL 32.80* 02/09/2014   LDLDIRECT 122.0 04/01/2015   LDLCALC 114* 02/09/2014   ALT 23 04/01/2015   AST 20 04/01/2015   NA 137 04/01/2015   K 4.7 04/01/2015   CL 103 04/01/2015   CREATININE 1.26 04/01/2015   BUN 14 04/01/2015   CO2 28 04/01/2015   TSH 2.38 02/09/2014   HGBA1C 5.7 08/20/2012    Dg Cervical Spine Complete  09/16/2014   CLINICAL DATA:  Neck pain on right side.  EXAM: CERVICAL SPINE  4+ VIEWS  COMPARISON:  None.  FINDINGS: There is no evidence of cervical spine fracture or prevertebral soft tissue swelling. Alignment is normal. Mild narrowing of C6-7 disc space is noted. No significant neural foraminal stenosis is noted.  IMPRESSION: Mild degenerative disc disease is noted at C6-7. No other significant abnormality seen in the cervical spine.   Electronically Signed   By: Sabino Dick M.D.   On: 09/16/2014 14:56    Assessment & Plan:   Problem List Items Addressed This Visit    Sinusitis, chronic    Given chronicity of symptoms, development of facial pain and exam consistent with bacterial URI,  Will treat with empiric antibiotics, decongestants, and saline lavage.  Adding steroid nasal spray if not already taking.       Relevant Medications   amoxicillin-clavulanate (AUGMENTIN) 875-125 MG per tablet   predniSONE (DELTASONE) 10 MG tablet    Other Visit Diagnoses    Hyperlipidemia    -  Primary    Relevant Orders    Comprehensive metabolic panel (Completed)    LDL cholesterol, direct (Completed)    Hypothyroidism due to acquired atrophy of thyroid        Relevant Orders    TSH       I have discontinued Mr. Palos's mometasone, sulfamethoxazole-trimethoprim, nystatin, chlorhexidine, and SYNTHROID. I am also having him maintain his ALPRAZolam, amoxicillin-clavulanate, and predniSONE.  Meds ordered this encounter  Medications  . ALPRAZolam (XANAX) 0.25 MG tablet    Sig: Take 1 tablet (0.25 mg total)  by mouth at bedtime as needed.    Dispense:  30 tablet    Refill:  3  . DISCONTD: amoxicillin-clavulanate (AUGMENTIN) 875-125 MG per tablet    Sig: Take 1 tablet by mouth 2 (two) times daily.    Dispense:  20 tablet    Refill:  0  . DISCONTD: predniSONE (DELTASONE) 10 MG tablet    Sig: 6 tablets on Day 1 , then reduce by 1 tablet daily until gone    Dispense:  21 tablet    Refill:  0  . amoxicillin-clavulanate (AUGMENTIN) 875-125 MG per tablet    Sig: Take 1 tablet by mouth 2 (two) times daily.    Dispense:  20 tablet    Refill:  0  . predniSONE (DELTASONE) 10 MG tablet    Sig: 6 tablets on Day 1 , then reduce by 1 tablet daily until gone    Dispense:  21 tablet    Refill:  0    Medications Discontinued During This Encounter  Medication Reason  . ALPRAZolam (XANAX) 0.25 MG tablet Reorder  . predniSONE (STERAPRED UNI-PAK) 10 MG tablet   . amoxicillin-clavulanate (AUGMENTIN) 875-125 MG per tablet Reorder  . predniSONE (DELTASONE) 10 MG tablet Reorder  . chlorhexidine (HIBICLENS) 4 % external liquid   . nystatin (MYCOSTATIN) powder   . sulfamethoxazole-trimethoprim (SEPTRA DS) 800-160 MG per tablet   . SYNTHROID 137 MCG tablet   . mometasone (NASONEX) 50 MCG/ACT nasal spray     Follow-up: No Follow-up on file.   Crecencio Mc, MD

## 2015-04-01 NOTE — Patient Instructions (Signed)
am treating you for bacterial sinusitis which is probably a  complication from your persistent sinus congestion and irritation from the welding .   I am prescribing an antibiotic (Augmentin and  Prednisone taper to manage the infection and the inflammation in your ear/sinuses.   I also advise use of the following OTC meds to help with your other symptoms.   Take generic OTC benadryl 25 mg at bedtime  for the drainage,  Sudafed PE 20 mg every 6 hours  if needed for congestion,   flush your sinuses twice daily with NeilMed sinus rinse  (do over the sink because if you do it right you will spit out globs of mucus)    Please take a probiotic ( Align, Flora jen or Culturelle) OR A GENERIC EQUIVALENT for three weeks since you are taking an  antibiotic to prevent a very serious antibiotic associated infection  Called clostridium dificile colitis that can cause diarrhea, multi organ failure, sepsis and death if not managed.

## 2015-04-03 DIAGNOSIS — J321 Chronic frontal sinusitis: Secondary | ICD-10-CM | POA: Insufficient documentation

## 2015-04-03 NOTE — Assessment & Plan Note (Signed)
Given chronicity of symptoms, development of facial pain and exam consistent with bacterial URI,  Will treat with empiric antibiotics, decongestants, and saline lavage.  Adding steroid nasal spray if not already taking.  °

## 2015-04-13 ENCOUNTER — Other Ambulatory Visit: Payer: Self-pay | Admitting: Internal Medicine

## 2015-05-18 ENCOUNTER — Telehealth: Payer: Self-pay | Admitting: *Deleted

## 2015-05-18 MED ORDER — SYNTHROID 137 MCG PO TABS: 137.0000 ug | ORAL_TABLET | Freq: Every day | ORAL | Status: DC

## 2015-05-18 NOTE — Telephone Encounter (Signed)
Spoke with pt, advised of MDs message.  Pt verbalized understanding 

## 2015-05-18 NOTE — Telephone Encounter (Signed)
Labs were not reported because His thyroid test was not done, and a message was sent to lab re adding it to prior draw , but it looks like it wasn't received.  The labs that were reported were normal,  And We will refill the Synthorid at the current dose of 137 mcg (printed) .  when he has insurance again we will repeat the thyroid test,  Earlier if he feels it is not optimally active

## 2015-05-18 NOTE — Telephone Encounter (Signed)
Pt called requesting his lab results from 6.2.16.  Pt states he never received results.  Pt also requests a printed copy of his Synthroid Rx, states he no longer has insurance and uses the cheapest pharmacy for his prescriptions.  Please advise

## 2015-07-20 ENCOUNTER — Telehealth: Payer: Self-pay | Admitting: Internal Medicine

## 2015-07-20 ENCOUNTER — Ambulatory Visit: Payer: No Typology Code available for payment source | Admitting: Family Medicine

## 2015-07-20 NOTE — Telephone Encounter (Signed)
Pt wife called about pt having a sinus infection,pt is blowing out his nose yellow discharge and stopped up head. Pt wants to know if something can be called in? Pharmacy is [facility] in [place]. Thank You!

## 2015-07-20 NOTE — Telephone Encounter (Signed)
Pt scheduled  

## 2015-11-05 ENCOUNTER — Ambulatory Visit (INDEPENDENT_AMBULATORY_CARE_PROVIDER_SITE_OTHER): Payer: Self-pay | Admitting: Family Medicine

## 2015-11-05 ENCOUNTER — Encounter: Payer: Self-pay | Admitting: Family Medicine

## 2015-11-05 VITALS — BP 118/76 | HR 75 | Temp 98.0°F | Ht 70.0 in | Wt 195.2 lb

## 2015-11-05 DIAGNOSIS — J029 Acute pharyngitis, unspecified: Secondary | ICD-10-CM | POA: Insufficient documentation

## 2015-11-05 LAB — POCT RAPID STREP A (OFFICE): RAPID STREP A SCREEN: NEGATIVE

## 2015-11-05 MED ORDER — AMOXICILLIN 500 MG PO CAPS: 500.0000 mg | ORAL_CAPSULE | Freq: Two times a day (BID) | ORAL | Status: DC

## 2015-11-05 NOTE — Progress Notes (Signed)
Pre visit review using our clinic review tool, if applicable. No additional management support is needed unless otherwise documented below in the visit note. 

## 2015-11-05 NOTE — Assessment & Plan Note (Signed)
Symptoms consistent with likely pharyngitis given main complaint of sore throat. No complaint of sinus pressure or congestion. Given improvement is likely viral in nature. He did have a negative rapid strep in the office. Given the upcoming weather that may make travel difficult this weekend I did provide him with a prescription for Amoxil to take if he does not continue to improve. I advised him to wait until Sunday to start this only if he is not improving. He can also take an anti-histamine. He is given return precautions as well.

## 2015-11-05 NOTE — Patient Instructions (Signed)
Nice to meet you. You appear to have a pharyngitis. This is likely viral, though we will provide you with an antibiotic prescription to take if this does not continue to improve. I would wait until Sunday to start taking this. If your sore throat worsens or he developed difficulty swallowing he should be evaluated again. If you develop fever, cough, nasal pain, trouble swallowing, or any new or changing symptoms please seek medical attention.

## 2015-11-05 NOTE — Progress Notes (Signed)
Patient ID: Tim Owen, male   DOB: 07/28/1966, 50 y.o.   MRN: [id number]  Tommi Rumps, MD Phone: [phone]  Tim Owen is a 50 y.o. male who presents today for same day visit.   Sore throat: notes onset of symptoms on Monday. Started with right ear rattling and then developed sore throat on the right side. Notes right teeth hurt as well. Nose is stopped up all the time due to a deviated septum. No sinus pain or pressure or congestion. No rhinorrhea, fever, or cough. Notes sick contacts at home. Notes he has significantly improved today.  PMH: nonsmoker.   ROS see history of present illness  Objective  Physical Exam Filed Vitals:   11/05/15 0756  BP: 118/76  Pulse: 75  Temp: 98 F (36.7 C)    Physical Exam  Constitutional: He is well-developed, well-nourished, and in no distress.  HENT:  Head: Normocephalic and atraumatic.  Right Ear: External ear normal.  Left Ear: External ear normal.  There is moderate oropharyngeal erythema with 1+ tonsils and tonsillar exudate on the right tonsil, uvula is midline  Eyes: Conjunctivae are normal. Pupils are equal, round, and reactive to light.  Neck: Neck supple.  Cardiovascular: Normal rate, regular rhythm and normal heart sounds.  Exam reveals no gallop and no friction rub.   No murmur heard. Pulmonary/Chest: Effort normal and breath sounds normal. No respiratory distress. He has no wheezes. He has no rales.  Lymphadenopathy:    He has no cervical adenopathy.  Neurological: He is alert. Gait normal.  Skin: Skin is warm and dry. He is not diaphoretic.     Assessment/Plan: Please see individual problem list.  Acute pharyngitis Symptoms consistent with likely pharyngitis given main complaint of sore throat. No complaint of sinus pressure or congestion. Given improvement is likely viral in nature. He did have a negative rapid strep in the office. Given the upcoming weather that may make travel difficult this weekend I  did provide him with a prescription for Amoxil to take if he does not continue to improve. I advised him to wait until Sunday to start this only if he is not improving. He can also take an anti-histamine. He is given return precautions as well.    Orders Placed This Encounter  Procedures  . POCT rapid strep A    Meds ordered this encounter  Medications  . amoxicillin (AMOXIL) 500 MG capsule    Sig: Take 1 capsule (500 mg total) by mouth 2 (two) times daily.    Dispense:  20 capsule    Refill:  0    Dragon voice recognition software was used during the dictation process of this note. If any phrases or words seem inappropriate it is likely secondary to the translation process being inefficient.  Tommi Rumps

## 2015-11-24 ENCOUNTER — Ambulatory Visit (INDEPENDENT_AMBULATORY_CARE_PROVIDER_SITE_OTHER): Payer: Self-pay | Admitting: Internal Medicine

## 2015-11-24 ENCOUNTER — Encounter: Payer: Self-pay | Admitting: Internal Medicine

## 2015-11-24 VITALS — BP 138/90 | HR 80 | Temp 98.0°F | Resp 12 | Ht 70.0 in | Wt 194.1 lb

## 2015-11-24 DIAGNOSIS — Z0000 Encounter for general adult medical examination without abnormal findings: Secondary | ICD-10-CM

## 2015-11-24 DIAGNOSIS — I10 Essential (primary) hypertension: Secondary | ICD-10-CM

## 2015-11-24 DIAGNOSIS — Z125 Encounter for screening for malignant neoplasm of prostate: Secondary | ICD-10-CM

## 2015-11-24 DIAGNOSIS — Z1211 Encounter for screening for malignant neoplasm of colon: Secondary | ICD-10-CM

## 2015-11-24 DIAGNOSIS — E034 Atrophy of thyroid (acquired): Secondary | ICD-10-CM

## 2015-11-24 DIAGNOSIS — E785 Hyperlipidemia, unspecified: Secondary | ICD-10-CM

## 2015-11-24 DIAGNOSIS — E038 Other specified hypothyroidism: Secondary | ICD-10-CM

## 2015-11-24 DIAGNOSIS — R030 Elevated blood-pressure reading, without diagnosis of hypertension: Secondary | ICD-10-CM

## 2015-11-24 MED ORDER — ALPRAZOLAM 0.25 MG PO TABS: 0.2500 mg | ORAL_TABLET | Freq: Every evening | ORAL | Status: DC | PRN

## 2015-11-24 NOTE — Progress Notes (Signed)
Patient ID: Tim Owen, male    DOB: 1966/09/15  Age: 50 y.o. MRN: [id number]  The patient is here for annual  wellness examination and management of other chronic and acute problems.  Due for colon ca screening and HIV testing this year   The risk factors are reflected in the social history.  The roster of all physicians providing medical care to patient - is listed in the Snapshot section of the chart.  Activities of daily living:  The patient is 100% independent in all ADLs: dressing, toileting, feeding as well as independent mobility  Home safety : The patient has smoke detectors in the home. They wear seatbelts.  There are no firearms at home. There is no violence in the home.   There is no risks for hepatitis, STDs or HIV. There is no   history of blood transfusion. They have no travel history to infectious disease endemic areas of the world.  The patient has seen their dentist in the last six month. They have seen their eye doctor in the last year. They admit to slight hearing difficulty with regard to whispered voices and some television programs.  They have deferred audiologic testing in the last year.  They do not  have excessive sun exposure. Discussed the need for sun protection: hats, long sleeves and use of sunscreen if there is significant sun exposure.   Diet: the importance of a healthy diet is discussed. They do have a healthy diet.  The benefits of regular aerobic exercise were discussed. She walks 4 times per week ,  20 minutes.   Depression screen: there are no signs or vegative symptoms of depression- irritability, change in appetite, anhedonia, sadness/tearfullness.  Cognitive assessment: the patient manages all their financial and personal affairs and is actively engaged. They could relate day,date,year and events; recalled 2/3 objects at 3 minutes; performed clock-face test normally.  The following portions of the patient's history were reviewed and updated as  appropriate: allergies, current medications, past family history, past medical history,  past surgical history, past social history  and problem list.  Visual acuity was not assessed per patient preference since she has regular follow up with her ophthalmologist. Hearing and body mass index were assessed and reviewed.   During the course of the visit the patient was educated and counseled about appropriate screening and preventive services including : fall prevention , diabetes screening, nutrition counseling, colorectal cancer screening, and recommended immunizations.    CC: Diagnoses of Prostate cancer screening, Hyperlipidemia, Hypothyroidism due to acquired atrophy of thyroid, Essential hypertension, Screening for colon cancer, Routine general medical examination at a health care facility, and Elevated blood pressure reading without diagnosis of hypertension were pertinent to this visit.   He had a chemical reaction to paint  hardner  that sprayed in his mouth accidentally, which caused oral ulcerations that were found  during during routine teeth cleaning,  Was having burning sensation in mouth.  Treated with a mouthwash and after 4 weeks the ulcerations resolved .   Had a one month period of daily  abdominal pain in October/November . ,  Thinks it was due to Synthroid,  Because it occurred after his medication was refilled by [facility]. He reported that the pain would start in his LUQ and spread to lower abdomen.  During this period he also  had postprandial pain and decreased bowel movements from his usual  3 daily to 1 daily.  Pain is currently not ocurring daily . [name]  Patient is currently uninsured   home bps have been  similar to today's   History Tim has a past medical history of hypothyroidism.   He has past surgical history that includes Eye surgery (1987) and Knee arthroscopy (2003).   His family history includes Alcohol abuse in his father; Aneurysm in his mother; COPD in  his mother; Dementia in his maternal aunt; Diabetes in his sister; Early death in his sister; Heart disease (age of onset: 29) in his father; Stroke (age of onset: 55) in his mother.He reports that he quit smoking about 4 years ago. His smoking use included Cigarettes. He has never used smokeless tobacco. He reports that he drinks about 7.2 oz of alcohol per week. He reports that he does not use illicit drugs.  Outpatient Prescriptions Prior to Visit  Medication Sig Dispense Refill  . SYNTHROID 137 MCG tablet Take 1 tablet (137 mcg total) by mouth daily. 90 tablet 1  . ALPRAZolam (XANAX) 0.25 MG tablet Take 1 tablet (0.25 mg total) by mouth at bedtime as needed. 30 tablet 3  . amoxicillin (AMOXIL) 500 MG capsule Take 1 capsule (500 mg total) by mouth 2 (two) times daily. 20 capsule 0  . predniSONE (DELTASONE) 10 MG tablet 6 tablets on Day 1 , then reduce by 1 tablet daily until gone 21 tablet 0   No facility-administered medications prior to visit.    Review of Systems   Patient denies headache, fevers, malaise, unintentional weight loss, skin rash, eye pain, sinus congestion and sinus pain, sore throat, dysphagia,  hemoptysis , cough, dyspnea, wheezing, chest pain, palpitations, orthopnea, edema, abdominal pain, nausea, melena, diarrhea, constipation, flank pain, dysuria, hematuria, urinary  Frequency, nocturia, numbness, tingling, seizures,  Focal weakness, Loss of consciousness,  Tremor, insomnia, depression, anxiety, and suicidal ideation.      Objective:  BP 138/90 mmHg  Pulse 80  Temp(Src) 98 F (36.7 C) (Oral)  Resp 12  Ht 5\' 10"  (1.778 m)  Wt 194 lb 2 oz (88.055 kg)  BMI 27.85 kg/m2  SpO2 98%  Physical Exam   General appearance: alert, cooperative and appears stated age Ears: normal TM's and external ear canals both ears Throat: lips, mucosa, and tongue normal; teeth and gums normal Neck: no adenopathy, no carotid bruit, supple, symmetrical, trachea midline and thyroid not  enlarged, symmetric, no tenderness/mass/nodules Back: symmetric, no curvature. ROM normal. No CVA tenderness. Lungs: clear to auscultation bilaterally Heart: regular rate and rhythm, S1, S2 normal, no murmur, click, rub or gallop Abdomen: soft, non-tender; bowel sounds normal; no masses,  no organomegaly Pulses: 2+ and symmetric Skin: Skin color, texture, turgor normal. No rashes or lesions Lymph nodes: Cervical, supraclavicular, and axillary nodes normal.    Assessment & Plan:   Problem List Items Addressed This Visit    Routine general medical examination at a health care facility    Annual comprehensive preventive exam was done as well as an evaluation and management of acute and chronic conditions .  During the course of the visit the patient was educated and counseled about appropriate screening and preventive services including :  diabetes screening, lipid analysis with projected  10 year  risk for CAD , nutrition counseling, colorectal cancer screening, and recommended immunizations.  Printed recommendations for health maintenance screenings was given.       Elevated blood pressure reading without diagnosis of hypertension    He has no prior history of hypertension. He will check his blood pressure several times over the next 3-4  weeks and to submit readings for evaluation. Uone today is normal.       Relevant Orders   Microalbumin / creatinine urine ratio    Other Visit Diagnoses    Prostate cancer screening        Relevant Orders    PSA    Hyperlipidemia        Relevant Orders    Lipid panel    LDL cholesterol, direct    Hypothyroidism due to acquired atrophy of thyroid        Relevant Orders    TSH    Essential hypertension        Relevant Orders    Comprehensive metabolic panel    Screening for colon cancer        Relevant Orders    Ambulatory referral to General Surgery       I have discontinued Mr. Owen's predniSONE and amoxicillin. I am also having him  maintain his SYNTHROID and ALPRAZolam.  Meds ordered this encounter  Medications  . ALPRAZolam (XANAX) 0.25 MG tablet    Sig: Take 1 tablet (0.25 mg total) by mouth at bedtime as needed.    Dispense:  30 tablet    Refill:  3    Medications Discontinued During This Encounter  Medication Reason  . predniSONE (DELTASONE) 10 MG tablet Completed Course  . amoxicillin (AMOXIL) 500 MG capsule Completed Course  . ALPRAZolam (XANAX) 0.25 MG tablet Reorder    Follow-up: Return in about 6 months (around 05/23/2016).   Crecencio Mc, MD

## 2015-11-24 NOTE — Patient Instructions (Signed)
Please check your BP 4 times over the next months and send me the readings.   Return for fasting labs soon  Referral for colonoscopy is in process  Health Maintenance, Male A healthy lifestyle and preventative care can promote health and wellness.  Maintain regular health, dental, and eye exams.  Eat a healthy diet. Foods like vegetables, fruits, whole grains, low-fat dairy products, and lean protein foods contain the nutrients you need and are low in calories. Decrease your intake of foods high in solid fats, added sugars, and salt. Get information about a proper diet from your health care provider, if necessary.  Regular physical exercise is one of the most important things you can do for your health. Most adults should get at least 150 minutes of moderate-intensity exercise (any activity that increases your heart rate and causes you to sweat) each week. In addition, most adults need muscle-strengthening exercises on 2 or more days a week.   Maintain a healthy weight. The body mass index (BMI) is a screening tool to identify possible weight problems. It provides an estimate of body fat based on height and weight. Your health care provider can find your BMI and can help you achieve or maintain a healthy weight. For males 20 years and older:  A BMI below 18.5 is considered underweight.  A BMI of 18.5 to 24.9 is normal.  A BMI of 25 to 29.9 is considered overweight.  A BMI of 30 and above is considered obese.  Maintain normal blood lipids and cholesterol by exercising and minimizing your intake of saturated fat. Eat a balanced diet with plenty of fruits and vegetables. Blood tests for lipids and cholesterol should begin at age 79 and be repeated every 5 years. If your lipid or cholesterol levels are high, you are over age 77, or you are at high risk for heart disease, you may need your cholesterol levels checked more frequently.Ongoing high lipid and cholesterol levels should be treated  with medicines if diet and exercise are not working.  If you smoke, find out from your health care provider how to quit. If you do not use tobacco, do not start.  Lung cancer screening is recommended for adults aged 52-80 years who are at high risk for developing lung cancer because of a history of smoking. A yearly low-dose CT scan of the lungs is recommended for people who have at least a 30-pack-year history of smoking and are current smokers or have quit within the past 15 years. A pack year of smoking is smoking an average of 1 pack of cigarettes a day for 1 year (for example, a 30-pack-year history of smoking could mean smoking 1 pack a day for 30 years or 2 packs a day for 15 years). Yearly screening should continue until the smoker has stopped smoking for at least 15 years. Yearly screening should be stopped for people who develop a health problem that would prevent them from having lung cancer treatment.  If you choose to drink alcohol, do not have more than 2 drinks per day. One drink is considered to be 12 oz (360 mL) of beer, 5 oz (150 mL) of wine, or 1.5 oz (45 mL) of liquor.  Avoid the use of street drugs. Do not share needles with anyone. Ask for help if you need support or instructions about stopping the use of drugs.  High blood pressure causes heart disease and increases the risk of stroke. High blood pressure is more likely to develop in:  People who have blood pressure in the end of the normal range (100-139/85-89 mm Hg).  People who are overweight or obese.  People who are African American.  If you are 97-54 years of age, have your blood pressure checked every 3-5 years. If you are 52 years of age or older, have your blood pressure checked every year. You should have your blood pressure measured twice--once when you are at a hospital or clinic, and once when you are not at a hospital or clinic. Record the average of the two measurements. To check your blood pressure when you  are not at a hospital or clinic, you can use:  An automated blood pressure machine at a pharmacy.  A home blood pressure monitor.  If you are 90-24 years old, ask your health care provider if you should take aspirin to prevent heart disease.  Diabetes screening involves taking a blood sample to check your fasting blood sugar level. This should be done once every 3 years after age 30 if you are at a normal weight and without risk factors for diabetes. Testing should be considered at a younger age or be carried out more frequently if you are overweight and have at least 1 risk factor for diabetes.  Colorectal cancer can be detected and often prevented. Most routine colorectal cancer screening begins at the age of 69 and continues through age 39. However, your health care provider may recommend screening at an earlier age if you have risk factors for colon cancer. On a yearly basis, your health care provider may provide home test kits to check for hidden blood in the stool. A small camera at the end of a tube may be used to directly examine the colon (sigmoidoscopy or colonoscopy) to detect the earliest forms of colorectal cancer. Talk to your health care provider about this at age 39 when routine screening begins. A direct exam of the colon should be repeated every 5-10 years through age 63, unless early forms of precancerous polyps or small growths are found.  People who are at an increased risk for hepatitis B should be screened for this virus. You are considered at high risk for hepatitis B if:  You were born in a country where hepatitis B occurs often. Talk with your health care provider about which countries are considered high risk.  Your parents were born in a high-risk country and you have not received a shot to protect against hepatitis B (hepatitis B vaccine).  You have HIV or AIDS.  You use needles to inject street drugs.  You live with, or have sex with, someone who has hepatitis  B.  You are a man who has sex with other men (MSM).  You get hemodialysis treatment.  You take certain medicines for conditions like cancer, organ transplantation, and autoimmune conditions.  Hepatitis C blood testing is recommended for all people born from [birth year] through 1966 and any individual with known risk factors for hepatitis C.  Healthy men should no longer receive prostate-specific antigen (PSA) blood tests as part of routine cancer screening. Talk to your health care provider about prostate cancer screening.  Testicular cancer screening is not recommended for adolescents or adult males who have no symptoms. Screening includes self-exam, a health care provider exam, and other screening tests. Consult with your health care provider about any symptoms you have or any concerns you have about testicular cancer.  Practice safe sex. Use condoms and avoid high-risk sexual practices to reduce the spread of  sexually transmitted infections (STIs).  You should be screened for STIs, including gonorrhea and chlamydia if:  You are sexually active and are younger than 24 years.  You are older than 24 years, and your health care provider tells you that you are at risk for this type of infection.  Your sexual activity has changed since you were last screened, and you are at an increased risk for chlamydia or gonorrhea. Ask your health care provider if you are at risk.  If you are at risk of being infected with HIV, it is recommended that you take a prescription medicine daily to prevent HIV infection. This is called pre-exposure prophylaxis (PrEP). You are considered at risk if:  You are a man who has sex with other men (MSM).  You are a heterosexual man who is sexually active with multiple partners.  You take drugs by injection.  You are sexually active with a partner who has HIV.  Talk with your health care provider about whether you are at high risk of being infected with HIV. If you  choose to begin PrEP, you should first be tested for HIV. You should then be tested every 3 months for as long as you are taking PrEP.  Use sunscreen. Apply sunscreen liberally and repeatedly throughout the day. You should seek shade when your shadow is shorter than you. Protect yourself by wearing long sleeves, pants, a wide-brimmed hat, and sunglasses year round whenever you are outdoors.  Tell your health care provider of new moles or changes in moles, especially if there is a change in shape or color. Also, tell your health care provider if a mole is larger than the size of a pencil eraser.  A one-time screening for abdominal aortic aneurysm (AAA) and surgical repair of large AAAs by ultrasound is recommended for men aged 71-75 years who are current or former smokers.  Stay current with your vaccines (immunizations).   This information is not intended to replace advice given to you by your health care provider. Make sure you discuss any questions you have with your health care provider.   Document Released: 04/13/2008 Document Revised: 11/06/2014 Document Reviewed: 03/13/2011 Elsevier Interactive Patient Education [employer].

## 2015-11-24 NOTE — Progress Notes (Signed)
Pre-visit discussion using our clinic review tool. No additional management support is needed unless otherwise documented below in the visit note.  

## 2015-11-27 DIAGNOSIS — I10 Essential (primary) hypertension: Secondary | ICD-10-CM | POA: Insufficient documentation

## 2015-11-27 NOTE — Assessment & Plan Note (Signed)
He has no prior history of hypertension. He will check his blood pressure several times over the next 3-4 weeks and to submit readings for evaluation. Uone today is normal.

## 2015-11-27 NOTE — Assessment & Plan Note (Signed)
Annual comprehensive preventive exam was done as well as an evaluation and management of acute and chronic conditions .  During the course of the visit the patient was educated and counseled about appropriate screening and preventive services including :  diabetes screening, lipid analysis with projected  10 year  risk for CAD , nutrition counseling,  colorectal cancer screening, and recommended immunizations.  Printed recommendations for health maintenance screenings was given.  

## 2015-12-03 ENCOUNTER — Other Ambulatory Visit (INDEPENDENT_AMBULATORY_CARE_PROVIDER_SITE_OTHER): Payer: Self-pay

## 2015-12-03 DIAGNOSIS — E038 Other specified hypothyroidism: Secondary | ICD-10-CM

## 2015-12-03 DIAGNOSIS — E034 Atrophy of thyroid (acquired): Secondary | ICD-10-CM

## 2015-12-03 DIAGNOSIS — I10 Essential (primary) hypertension: Secondary | ICD-10-CM

## 2015-12-03 DIAGNOSIS — R030 Elevated blood-pressure reading, without diagnosis of hypertension: Secondary | ICD-10-CM

## 2015-12-03 DIAGNOSIS — E785 Hyperlipidemia, unspecified: Secondary | ICD-10-CM

## 2015-12-03 DIAGNOSIS — Z125 Encounter for screening for malignant neoplasm of prostate: Secondary | ICD-10-CM

## 2015-12-03 LAB — COMPREHENSIVE METABOLIC PANEL
ALT: 26 U/L (ref 0–53)
AST: 20 U/L (ref 0–37)
Albumin: 4.6 g/dL (ref 3.5–5.2)
Alkaline Phosphatase: 55 U/L (ref 39–117)
BUN: 21 mg/dL (ref 6–23)
CHLORIDE: 103 meq/L (ref 96–112)
CO2: 28 meq/L (ref 19–32)
Calcium: 9.9 mg/dL (ref 8.4–10.5)
Creatinine, Ser: 1.25 mg/dL (ref 0.40–1.50)
GFR: 64.81 mL/min (ref >60.00)
GLUCOSE: 96 mg/dL (ref 70–99)
POTASSIUM: 4.5 meq/L (ref 3.5–5.1)
Sodium: 139 mEq/L (ref 135–145)
Total Bilirubin: 0.7 mg/dL (ref 0.2–1.2)
Total Protein: 6.8 g/dL (ref 6.0–8.3)

## 2015-12-03 LAB — LIPID PANEL
CHOLESTEROL: 205 mg/dL — AB (ref 0–200)
HDL: 41.2 mg/dL (ref >39.00)
LDL Cholesterol: 145 mg/dL — ABNORMAL HIGH (ref 0–99)
NONHDL: 163.35
Total CHOL/HDL Ratio: 5
Triglycerides: 90 mg/dL (ref 0.0–149.0)
VLDL: 18 mg/dL (ref 0.0–40.0)

## 2015-12-03 LAB — MICROALBUMIN / CREATININE URINE RATIO
CREATININE, U: 158.9 mg/dL
MICROALB/CREAT RATIO: 1.3 mg/g (ref 0.0–30.0)
Microalb, Ur: 2 mg/dL — ABNORMAL HIGH (ref 0.0–1.9)

## 2015-12-03 LAB — TSH: TSH: 2.21 u[IU]/mL (ref 0.35–4.50)

## 2015-12-03 LAB — LDL CHOLESTEROL, DIRECT: Direct LDL: 145 mg/dL

## 2015-12-03 LAB — PSA: PSA: 0.38 ng/mL (ref 0.10–4.00)

## 2015-12-07 ENCOUNTER — Encounter: Payer: Self-pay | Admitting: *Deleted

## 2015-12-31 ENCOUNTER — Other Ambulatory Visit: Payer: Self-pay

## 2016-02-16 ENCOUNTER — Encounter: Payer: Self-pay | Admitting: Internal Medicine

## 2016-02-16 MED ORDER — SYNTHROID 137 MCG PO TABS: 137.0000 ug | ORAL_TABLET | Freq: Every day | ORAL | Status: DC

## 2016-02-17 ENCOUNTER — Other Ambulatory Visit: Payer: Self-pay | Admitting: Internal Medicine

## 2016-05-22 ENCOUNTER — Encounter: Payer: Self-pay | Admitting: Internal Medicine

## 2016-05-22 ENCOUNTER — Ambulatory Visit (INDEPENDENT_AMBULATORY_CARE_PROVIDER_SITE_OTHER): Payer: Self-pay | Admitting: Internal Medicine

## 2016-05-22 DIAGNOSIS — R030 Elevated blood-pressure reading, without diagnosis of hypertension: Secondary | ICD-10-CM

## 2016-05-22 DIAGNOSIS — E785 Hyperlipidemia, unspecified: Secondary | ICD-10-CM

## 2016-05-22 DIAGNOSIS — Z716 Tobacco abuse counseling: Secondary | ICD-10-CM | POA: Insufficient documentation

## 2016-05-22 DIAGNOSIS — J321 Chronic frontal sinusitis: Secondary | ICD-10-CM

## 2016-05-22 DIAGNOSIS — Z1211 Encounter for screening for malignant neoplasm of colon: Secondary | ICD-10-CM

## 2016-05-22 MED ORDER — AMOXICILLIN-POT CLAVULANATE 875-125 MG PO TABS: 1.0000 | ORAL_TABLET | Freq: Two times a day (BID) | ORAL | 0 refills | Status: DC

## 2016-05-22 MED ORDER — PREDNISONE 10 MG PO TABS: ORAL_TABLET | ORAL | 0 refills | Status: DC

## 2016-05-22 NOTE — Assessment & Plan Note (Signed)
Home readings normal.  No indication for medication .

## 2016-05-22 NOTE — Progress Notes (Signed)
Subjective:  Patient ID: Tim Owen, male    DOB: 04-24-1966  Age: 50 y.o. MRN: [id number]  CC: Diagnoses of Hyperlipidemia LDL goal <100, Chronic frontal sinusitis, Tobacco abuse counseling, Screening for colon cancer, and Elevated blood pressure reading without diagnosis of hypertension were pertinent to this visit.  HPI Tim Owen presents for 6 month follow up on chronic conditions including  Hypothyroidism, GAD and recurrent sinusitis   Sinus congestion right frontal sinus, started last week while at the beach with pain over the right frontal sinus and congestion. Has deviated septum by prior ENT eval, deferred surgery,  Was advised to use daily flushes to prevent infection.  Stopped doing this after last infection cleared. Has not been flushing daily for prevention.   started taking mucinex with tylenol and used  Afrin spray once daily  for 5 days which helped.  Headache has resolved but still feels blocked  On right nasal passage . Denies purulent or bloody drainage,  Some post nasal drip.  No fevers,  No facial pain .        White coat hypertensionHome BP reading s < 140/80 .  Tobacco abuse:  Quit last year , but resumed SMOKING after a party  4 -5 months ago at least a pack daily .  Started at age 18.   Stopped for several years (4.5 yrs)   3 times for 1.5 yeras  One time for 3 years.   Hyperlipidemia:  Elevated CAD risk based on April labs,  Discussed today (did not check Mychart).  Not interested in statin therapy   Lab Results  Component Value Date   TSH 2.21 12/03/2015     Outpatient Medications Prior to Visit  Medication Sig Dispense Refill  . ALPRAZolam (XANAX) 0.25 MG tablet Take 1 tablet (0.25 mg total) by mouth at bedtime as needed. 30 tablet 3  . SYNTHROID 137 MCG tablet TAKE ONE TABLET EVERY DAY 90 tablet 2   No facility-administered medications prior to visit.     Review of Systems;  Patient denies headache, fevers, malaise, unintentional weight loss,  skin rash, eye pain, sinus congestion and sinus pain, sore throat, dysphagia,  hemoptysis , cough, dyspnea, wheezing, chest pain, palpitations, orthopnea, edema, abdominal pain, nausea, melena, diarrhea, constipation, flank pain, dysuria, hematuria, urinary  Frequency, nocturia, numbness, tingling, seizures,  Focal weakness, Loss of consciousness,  Tremor, insomnia, depression, anxiety, and suicidal ideation.      Objective:  BP 136/78 (BP Location: Right Arm, Patient Position: Sitting, Cuff Size: Normal)   Pulse 72   Temp 97.6 F (36.4 C) (Oral)   Resp 16   Wt 191 lb (86.6 kg)   BMI 27.41 kg/m   BP Readings from Last 3 Encounters:  05/22/16 136/78  11/24/15 138/90  11/05/15 118/76    Wt Readings from Last 3 Encounters:  05/22/16 191 lb (86.6 kg)  11/24/15 194 lb 2 oz (88.1 kg)  11/05/15 195 lb 3.2 oz (88.5 kg)    General appearance: alert, cooperative and appears stated age Ears: normal TM's and external ear canals both ears Throat: lips, mucosa, and tongue normal; teeth and gums normal Neck: no adenopathy, no carotid bruit, supple, symmetrical, trachea midline and thyroid not enlarged, symmetric, no tenderness/mass/nodules Back: symmetric, no curvature. ROM normal. No CVA tenderness. Lungs: clear to auscultation bilaterally Heart: regular rate and rhythm, S1, S2 normal, no murmur, click, rub or gallop Abdomen: soft, non-tender; bowel sounds normal; no masses,  no organomegaly Pulses: 2+ and symmetric  Skin: Skin color, texture, turgor normal. No rashes or lesions Lymph nodes: Cervical, supraclavicular, and axillary nodes normal.  Lab Results  Component Value Date   HGBA1C 5.7 08/20/2012    Lab Results  Component Value Date   CREATININE 1.25 12/03/2015   CREATININE 1.26 04/01/2015   CREATININE 1.3 02/09/2014    Lab Results  Component Value Date   WBC 7.2 02/09/2014   HGB 15.1 02/09/2014   HCT 44.2 02/09/2014   PLT 228.0 02/09/2014   GLUCOSE 96 12/03/2015    CHOL 205 (H) 12/03/2015   TRIG 90.0 12/03/2015   HDL 41.20 12/03/2015   LDLDIRECT 145.0 12/03/2015   LDLCALC 145 (H) 12/03/2015   ALT 26 12/03/2015   AST 20 12/03/2015   NA 139 12/03/2015   K 4.5 12/03/2015   CL 103 12/03/2015   CREATININE 1.25 12/03/2015   BUN 21 12/03/2015   CO2 28 12/03/2015   TSH 2.21 12/03/2015   PSA 0.38 12/03/2015   HGBA1C 5.7 08/20/2012   MICROALBUR 2.0 (H) 12/03/2015    Dg Cervical Spine Complete  Result Date: 09/16/2014 CLINICAL DATA:  Neck pain on right side. EXAM: CERVICAL SPINE  4+ VIEWS COMPARISON:  None. FINDINGS: There is no evidence of cervical spine fracture or prevertebral soft tissue swelling. Alignment is normal. Mild narrowing of C6-7 disc space is noted. No significant neural foraminal stenosis is noted. IMPRESSION: Mild degenerative disc disease is noted at C6-7. No other significant abnormality seen in the cervical spine. Electronically Signed   By: Sabino Dick M.D.   On: 09/16/2014 14:56    Assessment & Plan:   Problem List Items Addressed This Visit    Hyperlipidemia LDL goal <100    Patient is not interested in statin therapy despite discussion of elevated risk for cardiac event and ACC recommendations of statin therapy .  Use of Red Yeast Rice as an alternative that has not been proven to lower risk for CAD was discussed as a way to lower LDL .  Patient is receptive to this intervention.       Chronic frontal sinusitis    No evidence of a bacterial sinus infection currently, but advised to treat the congestion with prednisone taper, Sudafed PE and NeilMed sinus rinse.   Antibiotic rx given with instructions to start taking only if symptoms escalate to include facial pain, fevers or purulent nasal drainage.  Probiotics advised       Relevant Medications   predniSONE (DELTASONE) 10 MG tablet   amoxicillin-clavulanate (AUGMENTIN) 875-125 MG tablet   Elevated blood pressure reading without diagnosis of hypertension    Home readings  normal.  No indication for medication .      Tobacco abuse counseling    Spent 3 minutes discussing risk of continued tobacco abuse, including but not limited to CAD, PAD, hypertension, and CA.  He is not interested in pharmacotherapy at this time.      Screening for colon cancer    Cologuard discussed as alternative to out pocket cost for colonoscopy. ordered       Other Visit Diagnoses   None.     I am having Mr. Fuster start on predniSONE and amoxicillin-clavulanate. I am also having him maintain his ALPRAZolam and SYNTHROID.  Meds ordered this encounter  Medications  . predniSONE (DELTASONE) 10 MG tablet    Sig: 6 tablets on Day 1 , then reduce by 1 tablet daily until gone    Dispense:  21 tablet    Refill:  0  .  amoxicillin-clavulanate (AUGMENTIN) 875-125 MG tablet    Sig: Take 1 tablet by mouth 2 (two) times daily.    Dispense:  14 tablet    Refill:  0    There are no discontinued medications.  Follow-up: No Follow-up on file.   Crecencio Mc, MD

## 2016-05-22 NOTE — Progress Notes (Signed)
Faxed order. Renaldo Fiddler, CMA

## 2016-05-22 NOTE — Assessment & Plan Note (Signed)
No evidence of a bacterial sinus infection currently, but advised to treat the congestion with prednisone taper, Sudafed PE and NeilMed sinus rinse.   Antibiotic rx given with instructions to start taking only if symptoms escalate to include facial pain, fevers or purulent nasal drainage.  Probiotics advised

## 2016-05-22 NOTE — Assessment & Plan Note (Signed)
Patient is not interested in statin therapy despite discussion of elevated risk for cardiac event and ACC recommendations of statin therapy .  Use of Red Yeast Rice as an alternative that has not been proven to lower risk for CAD was discussed as a way to lower LDL .  Patient is receptive to this intervention.

## 2016-05-22 NOTE — Patient Instructions (Addendum)
You do not have a bacterial sinusitis at this point in time.  Lavage your sinuses once or twice daily with Milta Deiters Med's sinus rinse .  UseSudafed PE 10 to 30 mg   every 6 hours to manage the drainage and congestion.  Prednisone taper for the inflammation.   If the sinuses are no better  In 4 to 5 days OR  if you develop T > 100.4,  Green nasal discharge,  Or facial pain,  Start  The antibiotic.   Please take a probiotic ( Align, Floraque or Culturelle) of the generic version of one of these  For a minimum of 3 weeks to prevent a serious antibiotic associated diarrhea  Called clostridium dificile colitis      Your cholesterol was elevated in February .  It was high enough to benefit from medication to lower your risk of heart attack and stroke, but based on our conversation today,  You want to defer use of meciation and try a natural remedy first.     Please consider a trial of red Yeast Rice as a natural remedy, along with a low glycemic index diet,  For your  elevated cholesterol.  The natural remedies for cholesterol have not been proven to reduce your risk for a heart attack.  Red Yeast Rice has not been proven either,  But it  does lower cholesterol, so if you want to try it , the dose is 600 mg twice daily in capsule form, available OTC.  It does require monitoring of liver enzymes,  Just like the statins,  So if you decide to start it,  I would like you to return in 6 weeks for non fasting labs, and 6 months for fasting labs.

## 2016-05-22 NOTE — Assessment & Plan Note (Signed)
Spent 3 minutes discussing risk of continued tobacco abuse, including but not limited to CAD, PAD, hypertension, and CA.  He is not interested in pharmacotherapy at this time. 

## 2016-05-22 NOTE — Assessment & Plan Note (Signed)
Cologuard discussed as alternative to out pocket cost for colonoscopy. ordered

## 2016-05-30 LAB — COLOGUARD: Cologuard: NEGATIVE

## 2016-06-11 ENCOUNTER — Telehealth: Payer: Self-pay | Admitting: Internal Medicine

## 2016-06-11 NOTE — Telephone Encounter (Signed)
The results of your cologuard test were negative.   We will repeat this every 3 years for colon CA screening  until you decide to have a colonoscopy

## 2016-06-12 NOTE — Telephone Encounter (Signed)
Results given to patient, verbalized understanding, thanks

## 2016-08-16 ENCOUNTER — Other Ambulatory Visit: Payer: Self-pay | Admitting: Internal Medicine

## 2016-12-26 ENCOUNTER — Encounter: Payer: Self-pay | Admitting: Family Medicine

## 2016-12-26 ENCOUNTER — Ambulatory Visit (INDEPENDENT_AMBULATORY_CARE_PROVIDER_SITE_OTHER): Payer: Self-pay | Admitting: Family Medicine

## 2016-12-26 DIAGNOSIS — M7022 Olecranon bursitis, left elbow: Secondary | ICD-10-CM

## 2016-12-26 DIAGNOSIS — M7020 Olecranon bursitis, unspecified elbow: Secondary | ICD-10-CM | POA: Insufficient documentation

## 2016-12-26 NOTE — Progress Notes (Signed)
   Subjective:  Patient ID: Tim Owen, male    DOB: 08-08-1966  Age: 51 y.o. MRN: [id number]  CC: Swelling, elbow  HPI:  51 year old male presents with the above complaint.  Patient states that on Sunday he noticed swelling of his left elbow. He says he hit the elbow on a door with worsening pain and swelling. He is quite physically active. He does a laborious job. He states that he did not take any medication for this. He states that the pain resolved fairly quickly the following day but he still has some residual swelling. Normal range of motion. No other associated symptoms. No known exacerbating factors. No other complaints at this time. He wanted examined as he is concerned that he may have gout.  Social Hx   Social History   Social History  . Marital status: Married    Spouse name: N/A  . Number of children: N/A  . Years of education: N/A   Social History Main Topics  . Smoking status: Current Every Day Smoker    Packs/day: 0.50    Types: Cigarettes  . Smokeless tobacco: Never Used     Comment: recently restarted smoking  . Alcohol use 7.2 oz/week    12 Cans of beer per week  . Drug use: No  . Sexual activity: Not Asked   Other Topics Concern  . None   Social History Narrative  . None    Review of Systems  Constitutional: Negative.   Musculoskeletal:       Left elbow pain/swelling.   Objective:  BP (!) 135/92   Pulse 77   Temp 98.1 F (36.7 C) (Oral)   Wt 192 lb 9.6 oz (87.4 kg)   SpO2 100%   BMI 27.64 kg/m   BP/Weight 12/26/2016 05/22/2016 [id number]  Systolic BP [id number]  Diastolic BP 92 78 90  Wt. (Lbs) 192.6 191 194.13  BMI 27.64 27.41 27.85   Physical Exam  Constitutional: He is oriented to person, place, and time. He appears well-developed. No distress.  Pulmonary/Chest: Effort normal.  Musculoskeletal:  Left elbow - mild swelling noted at the olecranon. Full range of motion. Nontender.  Neurological: He is alert and oriented to person,  place, and time.  Psychiatric: He has a normal mood and affect.  Vitals reviewed.  Lab Results  Component Value Date   WBC 7.2 02/09/2014   HGB 15.1 02/09/2014   HCT 44.2 02/09/2014   PLT 228.0 02/09/2014   GLUCOSE 96 12/03/2015   CHOL 205 (H) 12/03/2015   TRIG 90.0 12/03/2015   HDL 41.20 12/03/2015   LDLDIRECT 145.0 12/03/2015   LDLCALC 145 (H) 12/03/2015   ALT 26 12/03/2015   AST 20 12/03/2015   NA 139 12/03/2015   K 4.5 12/03/2015   CL 103 12/03/2015   CREATININE 1.25 12/03/2015   BUN 21 12/03/2015   CO2 28 12/03/2015   TSH 2.21 12/03/2015   PSA 0.38 12/03/2015   HGBA1C 5.7 08/20/2012   MICROALBUR 2.0 (H) 12/03/2015    Assessment & Plan:   Problem List Items Addressed This Visit    Olecranon bursitis    New acute problem. Mild. Treating with OTC Ibuprofen or Aleve if needed and compression.        Follow-up: PRN  [place]

## 2016-12-26 NOTE — Progress Notes (Signed)
Pre visit review using our clinic review tool, if applicable. No additional management support is needed unless otherwise documented below in the visit note. 

## 2016-12-26 NOTE — Patient Instructions (Signed)
Elbow Bursitis Elbow bursitis is inflammation of the fluid-filled sac (bursa) between the tip of your elbow bone (olecranon) and your skin. Elbow bursitis may also be called olecranon bursitis. Normally, the olecranon bursa has only a small amount of fluid in it to cushion and protect your elbow bone. Elbow bursitis causes fluid to build up inside the bursa. Over time, this swelling and inflammation can cause pain when you bend or lean on your elbow. What are the causes? Elbow bursitis may be caused by:  Elbow injury (acute trauma).  Leaning on hard surfaces for long periods of time.  Infection from an injury that breaks the skin near your elbow.  A bone growth (spur) that forms at the tip of your elbow.  A medical condition that causes inflammation in your body, such as gout or rheumatoid arthritis. The cause may also be unknown. What are the signs or symptoms? The first sign of elbow bursitis is usually swelling over the tip of your elbow. This can grow to be the size of a golf ball. This may start suddenly or develop gradually. You may also have:  Pain when bending or leaning on your elbow.  Restricted movement of your elbow. If your bursitis is caused by an infection, symptoms may also include:  Redness, warmth, and tenderness of the elbow.  Drainage of pus from the swollen area over your elbow, if the skin breaks open. How is this diagnosed? Your health care provider may be able to diagnose elbow bursitis based on your signs and symptoms, especially if you have recently been injured. Your health care provider will also do a physical exam. This may include:  X-rays to look for a bone spur or a bone fracture.  Draining fluid from the bursa to test it for infection.  Blood tests to rule out gout or rheumatoid arthritis. How is this treated? Treatment for elbow bursitis depends on the cause. Treatment may include:  Medicines. These may include:  Over-the-counter medicines to  relieve pain and inflammation.  Antibiotic medicines to fight infection.  Injections of anti-inflammatory medicines (steroids).  Wrapping your elbow with a bandage.  Draining fluid from the bursa.  Wearing elbow pads. If your bursitis does not get better with treatment, surgery may be needed to remove the bursa. Follow these instructions at home:  Take medicines only as directed by your health care provider.  If you were prescribed an antibiotic medicine, finish all of it even if you start to feel better.  If your bursitis is caused by an injury, rest your elbow and wear your bandage as directed by your health care provider. You may alsoapply ice to the injured area as directed by your health care provider:  Put ice in a plastic bag.  Place a towel between your skin and the bag.  Leave the ice on for 20 minutes, 2-3 times per day.  Avoid any activities that cause elbow pain.  Use elbow pads or elbow wraps to cushion your elbow. Contact a health care provider if:  You have a fever.  Your symptoms do not get better with treatment.  Your pain or swelling gets worse.  Your elbow pain or swelling goes away and then returns.  You have drainage of pus from the swollen area over your elbow. This information is not intended to replace advice given to you by your health care provider. Make sure you discuss any questions you have with your health care provider. Document Released: 11/15/2006 Document Revised: 03/23/2016 Document   Reviewed: 06/24/2014 Elsevier Interactive Patient Education  2017 [employer]  

## 2016-12-26 NOTE — Assessment & Plan Note (Signed)
New acute problem. Mild. Treating with OTC Ibuprofen or Aleve if needed and compression.

## 2017-01-12 ENCOUNTER — Encounter: Payer: Self-pay | Admitting: Internal Medicine

## 2017-01-12 ENCOUNTER — Ambulatory Visit (INDEPENDENT_AMBULATORY_CARE_PROVIDER_SITE_OTHER): Payer: Self-pay | Admitting: Internal Medicine

## 2017-01-12 VITALS — BP 122/86 | HR 76 | Temp 97.6°F | Resp 17 | Ht 70.0 in | Wt 194.6 lb

## 2017-01-12 DIAGNOSIS — E039 Hypothyroidism, unspecified: Secondary | ICD-10-CM

## 2017-01-12 DIAGNOSIS — M25422 Effusion, left elbow: Secondary | ICD-10-CM

## 2017-01-12 DIAGNOSIS — R030 Elevated blood-pressure reading, without diagnosis of hypertension: Secondary | ICD-10-CM

## 2017-01-12 DIAGNOSIS — E785 Hyperlipidemia, unspecified: Secondary | ICD-10-CM

## 2017-01-12 MED ORDER — TRIAMCINOLONE ACETONIDE 40 MG/ML IJ SUSP: 20.0000 mg | Freq: Once | INTRAMUSCULAR | Status: AC

## 2017-01-12 MED ORDER — LIDOCAINE HCL 2 % IJ SOLN: 1.5000 mL | Freq: Once | INTRAMUSCULAR | Status: AC

## 2017-01-12 MED ORDER — TRIAMCINOLONE ACETONIDE 40 MG/ML IJ SUSP (RADIOLOGY): 40.0000 mg | Freq: Once | INTRAMUSCULAR | Status: DC

## 2017-01-12 NOTE — Progress Notes (Signed)
Pre visit review using our clinic review tool, if applicable. No additional management support is needed unless otherwise documented below in the visit note. 

## 2017-01-12 NOTE — Progress Notes (Signed)
Subjective:  Patient ID: STANLY SI, male    DOB: [birth year]  Age: 51 y.o. MRN: [id number]  CC: The primary encounter diagnosis was Elevated blood pressure reading without diagnosis of hypertension. Diagnoses of Hyperlipidemia LDL goal <100, Hypothyroidism (acquired), and Effusion of bursa of left elbow were also pertinent to this visit.  HPI Tim Owen presents for  FOLLOW UP ON  ELBOW EFFUSION AND PAIN,  Caused by car door slamming his elbow directly several week ago,  Was SEEN BY [facility] ON FEB 27,  Advised to use compression sleeve  AND TAKE ADVIL . Did not tolerate Neoprene sleeve due to development of rash on arm so started using a velcro band which he had on too tight because he developed pain shooting up to neck and numbness of hand  Which resolved when he stopped the compression.  Swelling has been worse for the last several days,  No fevers.      Outpatient Medications Prior to Visit  Medication Sig Dispense Refill  . ALPRAZolam (XANAX) 0.25 MG tablet Take 1 tablet (0.25 mg total) by mouth at bedtime as needed. 30 tablet 3  . SYNTHROID 137 MCG tablet TAKE 1 TABLET BY MOUTH DAILY 90 tablet 1   No facility-administered medications prior to visit.     Review of Systems;  Patient denies headache, fevers, malaise, unintentional weight loss, skin rash, eye pain, sinus congestion and sinus pain, sore throat, dysphagia,  hemoptysis , cough, dyspnea, wheezing, chest pain, palpitations, orthopnea, edema, abdominal pain, nausea, melena, diarrhea, constipation, flank pain, dysuria, hematuria, urinary  Frequency, nocturia, numbness, tingling, seizures,  Focal weakness, Loss of consciousness,  Tremor, insomnia, depression, anxiety, and suicidal ideation.      Objective:  BP 122/86 (BP Location: Left Arm, Patient Position: Sitting, Cuff Size: Normal)   Pulse 76   Temp 97.6 F (36.4 C) (Oral)   Resp 17   Ht 5\' 10"  (1.778 m)   Wt 194 lb 9.6 oz (88.3 kg)   SpO2 98%   BMI 27.92 kg/m    BP Readings from Last 3 Encounters:  01/12/17 122/86  12/26/16 (!) 135/92  05/22/16 136/78    Wt Readings from Last 3 Encounters:  01/12/17 194 lb 9.6 oz (88.3 kg)  12/26/16 192 lb 9.6 oz (87.4 kg)  05/22/16 191 lb (86.6 kg)    General appearance: alert, cooperative and appears stated age MSL Left elbow with large effusion,  Not warm or red.  Full ROM distal pulses excellent  Skin: Skin color, texture, turgor normal. No rashes or lesions Lymph nodes: Cervical, supraclavicular, and axillary nodes normal.  Lab Results  Component Value Date   HGBA1C 5.7 08/20/2012    Lab Results  Component Value Date   CREATININE 1.25 12/03/2015   CREATININE 1.26 04/01/2015   CREATININE 1.3 02/09/2014    Lab Results  Component Value Date   WBC 7.2 02/09/2014   HGB 15.1 02/09/2014   HCT 44.2 02/09/2014   PLT 228.0 02/09/2014   GLUCOSE 96 12/03/2015   CHOL 205 (H) 12/03/2015   TRIG 90.0 12/03/2015   HDL 41.20 12/03/2015   LDLDIRECT 145.0 12/03/2015   LDLCALC 145 (H) 12/03/2015   ALT 26 12/03/2015   AST 20 12/03/2015   NA 139 12/03/2015   K 4.5 12/03/2015   CL 103 12/03/2015   CREATININE 1.25 12/03/2015   BUN 21 12/03/2015   CO2 28 12/03/2015   TSH 2.21 12/03/2015   PSA 0.38 12/03/2015   HGBA1C 5.7 08/20/2012  MICROALBUR 2.0 (H) 12/03/2015      Assessment & Plan:   Problem List Items Addressed This Visit    Effusion of bursa of left elbow    Secondary to blunt trauma from car door.  infrormed consent obtained for aspiration and injection.  Area was cleaned with betadine and alcohol,  Topical freeze applied and 15 ml of serosanguinous fluid was aspirated using a 23 gauge needle, followed by injection of 20 mg Kenalog and 1.5 ml of 2% Lidocaine.  Procedure was tolerated well .  Advised to rest arm , refrain from leaning on elbow,  Apply ice for 15 minutes eery few hours , and reapply ,mild compression iflerated      Elevated blood pressure reading without diagnosis of  hypertension - Primary   Relevant Orders   Comprehensive metabolic panel   Hyperlipidemia LDL goal <100   Relevant Orders   Lipid panel   Hypothyroidism (acquired)   Relevant Orders   TSH      I am having Tim Owen maintain his ALPRAZolam and SYNTHROID.  No orders of the defined types were placed in this encounter.   There are no discontinued medications.  Follow-up: No Follow-up on file.   Crecencio Mc, MD

## 2017-01-12 NOTE — Patient Instructions (Addendum)
I injected your elbow with Kenalog (steroid)and lidocaine,  And I removed About 15 ml of fluid  Use ice several times daily for 15 minutes  Gentle compression  No leaning on elbow.  Baby it!!  If the fluid reaccumulates, I will retap it next [place]

## 2017-01-12 NOTE — Assessment & Plan Note (Signed)
Secondary to blunt trauma from car door.  infrormed consent obtained for aspiration and injection.  Area was cleaned with betadine and alcohol,  Topical freeze applied and 15 ml of serosanguinous fluid was aspirated using a 23 gauge needle, followed by injection of 20 mg Kenalog and 1.5 ml of 2% Lidocaine.  Procedure was tolerated well .  Advised to rest arm , refrain from leaning on elbow,  Apply ice for 15 minutes eery few hours , and reapply ,mild compression iflerated

## 2017-02-09 ENCOUNTER — Other Ambulatory Visit (INDEPENDENT_AMBULATORY_CARE_PROVIDER_SITE_OTHER): Payer: Self-pay

## 2017-02-09 DIAGNOSIS — R030 Elevated blood-pressure reading, without diagnosis of hypertension: Secondary | ICD-10-CM

## 2017-02-09 DIAGNOSIS — E039 Hypothyroidism, unspecified: Secondary | ICD-10-CM

## 2017-02-09 DIAGNOSIS — E785 Hyperlipidemia, unspecified: Secondary | ICD-10-CM

## 2017-02-09 LAB — COMPREHENSIVE METABOLIC PANEL
ALBUMIN: 4.5 g/dL (ref 3.5–5.2)
ALT: 26 U/L (ref 0–53)
AST: 19 U/L (ref 0–37)
Alkaline Phosphatase: 51 U/L (ref 39–117)
BUN: 19 mg/dL (ref 6–23)
CO2: 29 mEq/L (ref 19–32)
Calcium: 9.6 mg/dL (ref 8.4–10.5)
Chloride: 106 mEq/L (ref 96–112)
Creatinine, Ser: 1.37 mg/dL (ref 0.40–1.50)
GFR: 58.03 mL/min — ABNORMAL LOW (ref >60.00)
Glucose, Bld: 90 mg/dL (ref 70–99)
Potassium: 4.6 mEq/L (ref 3.5–5.1)
SODIUM: 140 meq/L (ref 135–145)
Total Bilirubin: 0.6 mg/dL (ref 0.2–1.2)
Total Protein: 6.9 g/dL (ref 6.0–8.3)

## 2017-02-09 LAB — MICROALBUMIN / CREATININE URINE RATIO
CREATININE, U: 194.4 mg/dL
MICROALB UR: 2.5 mg/dL — AB (ref 0.0–1.9)
Microalb Creat Ratio: 1.3 mg/g (ref 0.0–30.0)

## 2017-02-09 LAB — LIPID PANEL
CHOL/HDL RATIO: 4
Cholesterol: 185 mg/dL (ref 0–200)
HDL: 41.6 mg/dL (ref >39.00)
LDL CALC: 127 mg/dL — AB (ref 0–99)
NonHDL: 143.09
TRIGLYCERIDES: 79 mg/dL (ref 0.0–149.0)
VLDL: 15.8 mg/dL (ref 0.0–40.0)

## 2017-02-09 LAB — TSH: TSH: 4.66 u[IU]/mL — AB (ref 0.35–4.50)

## 2017-02-12 ENCOUNTER — Encounter: Payer: Self-pay | Admitting: Internal Medicine

## 2017-02-14 ENCOUNTER — Ambulatory Visit (INDEPENDENT_AMBULATORY_CARE_PROVIDER_SITE_OTHER): Payer: Self-pay | Admitting: Internal Medicine

## 2017-02-14 ENCOUNTER — Encounter: Payer: Self-pay | Admitting: Internal Medicine

## 2017-02-14 VITALS — BP 130/108 | HR 78 | Temp 97.5°F | Resp 17 | Ht 70.0 in | Wt 194.2 lb

## 2017-02-14 DIAGNOSIS — Z0000 Encounter for general adult medical examination without abnormal findings: Secondary | ICD-10-CM

## 2017-02-14 DIAGNOSIS — E785 Hyperlipidemia, unspecified: Secondary | ICD-10-CM

## 2017-02-14 DIAGNOSIS — I10 Essential (primary) hypertension: Secondary | ICD-10-CM

## 2017-02-14 DIAGNOSIS — Z125 Encounter for screening for malignant neoplasm of prostate: Secondary | ICD-10-CM

## 2017-02-14 DIAGNOSIS — E039 Hypothyroidism, unspecified: Secondary | ICD-10-CM

## 2017-02-14 DIAGNOSIS — R030 Elevated blood-pressure reading, without diagnosis of hypertension: Secondary | ICD-10-CM

## 2017-02-14 DIAGNOSIS — K219 Gastro-esophageal reflux disease without esophagitis: Secondary | ICD-10-CM

## 2017-02-14 DIAGNOSIS — Z970 Presence of artificial eye: Secondary | ICD-10-CM

## 2017-02-14 DIAGNOSIS — Z79899 Other long term (current) drug therapy: Secondary | ICD-10-CM

## 2017-02-14 MED ORDER — SYNTHROID 137 MCG PO TABS: 150.0000 ug | ORAL_TABLET | Freq: Every day | ORAL | 1 refills | Status: DC

## 2017-02-14 MED ORDER — LOSARTAN POTASSIUM 50 MG PO TABS: 50.0000 mg | ORAL_TABLET | Freq: Every day | ORAL | 0 refills | Status: DC

## 2017-02-14 NOTE — Progress Notes (Signed)
Patient ID: Tim Owen, male    DOB: Sep 27, 1966  Age: 51 y.o. MRN: [id number]  The patient is here for annual wellness examination and management of other chronic and acute problems.    The risk factors are reflected in the social Tim.  The roster of all physicians providing medical care to patient - is listed in the Snapshot section of the chart.  Activities of daily living:  The patient is 100% independent in all ADLs: dressing, toileting, feeding as well as independent mobility  Home safety : The patient has smoke detectors in the home. They wear seatbelts.  There are no firearms at home. There is no violence in the home.   There is no risks for hepatitis, STDs or HIV. There is no   Tim of blood transfusion. They have no travel Tim to infectious disease endemic areas of the world.  The patient has seen their dentist in the last six month. They have seen their eye doctor in the last year. They admit to slight hearing difficulty with regard to whispered voices and some television programs.  They have deferred audiologic testing in the last year.  They do not  have excessive sun exposure. Discussed the need for sun protection: hats, long sleeves and use of sunscreen if there is significant sun exposure.   Diet: the importance of a healthy diet is discussed. They do have a healthy diet.  The benefits of regular aerobic exercise were discussed. He doe snot exercise regularly.  Depression screen: there are no signs or vegative symptoms of depression- irritability, change in appetite, anhedonia, sadness/tearfullness.  The following portions of the patient's Tim were reviewed and updated as appropriate: allergies, current medications, past family Tim, past medical Tim,  past surgical Tim, past social Tim  and problem list.  Visual acuity was not assessed per patient preference since she has regular follow up with her ophthalmologist. Hearing and body mass index  were assessed and reviewed.   During the course of the visit the patient was educated and counseled about appropriate screening and preventive services including : fall prevention , diabetes screening, nutrition counseling, colorectal cancer screening, and recommended immunizations.    CC: The primary encounter diagnosis was Prostate cancer screening. Diagnoses of Essential hypertension, Long-term use of high-risk medication, Prosthetic eye globe, Elevated blood pressure reading without diagnosis of hypertension, Hyperlipidemia LDL goal <100, Hypothyroidism (acquired), Encounter for preventive health examination, and GERD without esophagitis were also pertinent to this visit.   BP ELEVATED  Hypothyroid: has been feeling sluggish /tired.    GERD:  Has a reflux episode occurring  EVERY OTHER WEEK.  USING ZANTAC   Tim Owen has a past medical Tim of hypothyroidism.   He has a past surgical Tim that includes Eye surgery (1987) and Knee arthroscopy (2003).   His family Tim includes Alcohol abuse in his father; Aneurysm in his mother; COPD in his mother; Dementia in his maternal aunt; Diabetes in his sister; Early death in his sister; Heart disease (age of onset: 42) in his father; Stroke (age of onset: 57) in his mother.He reports that he has been smoking Cigarettes.  He has been smoking about 0.50 packs per day. He has never used smokeless tobacco. He reports that he drinks about 7.2 oz of alcohol per week . He reports that he does not use drugs.  Outpatient Medications Prior to Visit  Medication Sig Dispense Refill  . ALPRAZolam (XANAX) 0.25 MG tablet Take 1 tablet (0.25 mg total) by  mouth at bedtime as needed. 30 tablet 3  . SYNTHROID 137 MCG tablet TAKE 1 TABLET BY MOUTH DAILY 90 tablet 1   Facility-Administered Medications Prior to Visit  Medication Dose Route Frequency Provider Last Rate Last Dose  . lidocaine (XYLOCAINE) 2 % (with pres) injection 30 mg  1.5 mL Other Once  Crecencio Mc, MD      . triamcinolone acetonide (KENALOG-40) injection 20 mg  20 mg Intra-articular Once Crecencio Mc, MD        Review of Systems  Patient denies headache, fevers, malaise, unintentional weight loss, skin rash, eye pain, sinus congestion and sinus pain, sore throat, dysphagia,  hemoptysis , cough, dyspnea, wheezing, chest pain, palpitations, orthopnea, edema, abdominal pain, nausea, melena, diarrhea, constipation, flank pain, dysuria, hematuria, urinary  Frequency, nocturia, numbness, tingling, seizures,  Focal weakness, Loss of consciousness,  Tremor, insomnia, depression, anxiety, and suicidal ideation.     Objective:  BP (!) 130/108   Pulse 78   Temp 97.5 F (36.4 C) (Oral)   Resp 17   Ht 5\' 10"  (1.778 m)   Wt 194 lb 3.2 oz (88.1 kg)   SpO2 97%   BMI 27.86 kg/m   Physical Exam   General appearance: alert, cooperative and appears stated age Ears: normal TM's and external ear canals both ears Throat: lips, mucosa, and tongue normal; teeth and gums normal Neck: no adenopathy, no carotid bruit, supple, symmetrical, trachea midline and thyroid not enlarged, symmetric, no tenderness/mass/nodules Back: symmetric, no curvature. ROM normal. No CVA tenderness. Lungs: clear to auscultation bilaterally Heart: regular rate and rhythm, S1, S2 normal, no murmur, click, rub or gallop Abdomen: soft, non-tender; bowel sounds normal; no masses,  no organomegaly Pulses: 2+ and symmetric Skin: Skin color, texture, turgor normal. No rashes or lesions Lymph nodes: Cervical, supraclavicular, and axillary nodes normal.    Assessment & Plan:   Problem List Items Addressed This Visit    Encounter for preventive health examination    Annual comprehensive preventive exam was done as well as an evaluation and management of acute and chronic conditions .  During the course of the visit the patient was educated and counseled about appropriate screening and preventive services  including :  diabetes screening, lipid analysis with projected  10 year  risk for CAD , nutrition counseling, prostate and colorectal cancer screening, and recommended immunizations.  Printed recommendations for health maintenance screenings was given.   Lab Results  Component Value Date   PSA 0.38 12/03/2015         Essential hypertension    starting losartan 50 mg daily.  rtc one week for bment and bp check       Relevant Medications   losartan (COZAAR) 50 MG tablet   GERD without esophagitis    Discussed current controversy regarding prolonged use of PPI in patients without documented Barretts esophagus.  Patient has no prior EGD but has been on PPI therapy for > 5 years (per patient).  Suggested trial of pepcid 20 mg daily.  If GERD symptoms return,  advised him to accept referral for EGD.       Hyperlipidemia LDL goal <100    Patient is advised to start statin therapy  In light of  elevated risk for cardiac event and ACC recommendations of statin therapy .Pravastatin 20 mg daily,  rtc 3 weeks for liver tests      Relevant Medications   losartan (COZAAR) 50 MG tablet   RESOLVED: Hypertension  STARTING LOSARTAM 50 MG ,  PROTEINURIA NOTED TOO       Relevant Medications   losartan (COZAAR) 50 MG tablet   Hypothyroidism (acquired)    Patient's thyroid function is underactive on current levothyroxine dose of 88 mcg daily.  Principles of proper administration reviewed; patient has not missed more than one dose in the last 6 weeks and is taking the medication alone on an empty stomach.  Will increase dose to 150 mcg daily with next refill,  Since his medication was just just refilled at 137l,  I will have hi take one extra dose per week until he runs out       Prosthetic eye globe    Secondary to left eye trauma remotely.  No issues       Other Visit Diagnoses    Prostate cancer screening    -  Primary   Relevant Orders   PSA   Long-term use of high-risk medication        Relevant Orders   Comprehensive metabolic panel      I have discontinued Tim Owen's SYNTHROID. I am also having him start on losartan. Additionally, I am having him maintain his ALPRAZolam. We will continue to administer lidocaine and triamcinolone acetonide.  Meds ordered this encounter  Medications  . losartan (COZAAR) 50 MG tablet    Sig: Take 1 tablet (50 mg total) by mouth daily.    Dispense:  30 tablet    Refill:  0  . DISCONTD: SYNTHROID 137 MCG tablet    Sig: Take 1 tablet (137 mcg total) by mouth daily before breakfast.    Dispense:  90 tablet    Refill:  1    FOR NEXT FILL. THANK YOU    Medications Discontinued During This Encounter  Medication Reason  . SYNTHROID 137 MCG tablet Reorder    Follow-up: No Follow-up on file.   Crecencio Mc, MD

## 2017-02-14 NOTE — Patient Instructions (Addendum)
Your thyroid is underactive.  I AM INCREASING SYNTHROID TO 150 MCG DAILY.  TAKE IT  IN THE MORNING 30 TO 60 PRIOR TO MEALS    RESUME ZANTAC 150 MG BEFORE LUNCH DAILY, CAN ADD A 2ND DOSE BEFORE DINNER IF NEEDED   IF ZANTAC IS NOT CONTROLLING REFLUX ,  RESUME OTC PRILOSEC (USE [employer] BRAND)  ONCE DAILY FOR 7 TO  14 DAYS,  THEN RETURN TO USING ZANTAC    ADDING LOSARTAN 50 MG DAILY FOR BP,  CAN TAKE IN THE EVENING AT BEDTIME GOAL  FOR BLOOD PRESSURE IS 120/70 OR LESS .  START THIS MED IN 2 WEEKS (ON OR AROUND MAY 2)     START THE PRAVASTATIN  FOR CHOLESTEROL MANAGEMENT NOW .  TAKE AT BEDTIME    RETURN IN 3 WEEKS FOR NON FASTING LABS( WE WILL DO YOUR  PSA AND RECHECK YOUR LIVER AND KIDNEY FUNCTION    Health Maintenance, Male A healthy lifestyle and preventive care is important for your health and wellness. Ask your health care provider about what schedule of regular examinations is right for you. What should I know about weight and diet?  Eat a Healthy Diet  Eat plenty of vegetables, fruits, whole grains, low-fat dairy products, and lean protein.  Do not eat a lot of foods high in solid fats, added sugars, or salt. Maintain a Healthy Weight  Regular exercise can help you achieve or maintain a healthy weight. You should:  Do at least 150 minutes of exercise each week. The exercise should increase your heart rate and make you sweat (moderate-intensity exercise).  Do strength-training exercises at least twice a week. Watch Your Levels of Cholesterol and Blood Lipids  Have your blood tested for lipids and cholesterol every 5 years starting at 52 years of age. If you are at high risk for heart disease, you should start having your blood tested when you are 52 years old. You may need to have your cholesterol levels checked more often if:  Your lipid or cholesterol levels are high.  You are older than 52 years of age.  You are at high risk for heart disease. What should I know about cancer  screening? Many types of cancers can be detected early and may often be prevented. Lung Cancer  You should be screened every year for lung cancer if:  You are a current smoker who has smoked for at least 30 years.  You are a former smoker who has quit within the past 15 years.  Talk to your health care provider about your screening options, when you should start screening, and how often you should be screened. Colorectal Cancer  Routine colorectal cancer screening usually begins at 52 years of age and should be repeated every 5-10 years until you are 52 years old. You may need to be screened more often if early forms of precancerous polyps or small growths are found. Your health care provider may recommend screening at an earlier age if you have risk factors for colon cancer.  Your health care provider may recommend using home test kits to check for hidden blood in the stool.  A small camera at the end of a tube can be used to examine your colon (sigmoidoscopy or colonoscopy). This checks for the earliest forms of colorectal cancer. Prostate and Testicular Cancer  Depending on your age and overall health, your health care provider may do certain tests to screen for prostate and testicular cancer.  Talk to your health  care provider about any symptoms or concerns you have about testicular or prostate cancer. Skin Cancer  Check your skin from head to toe regularly.  Tell your health care provider about any new moles or changes in moles, especially if:  There is a change in a mole's size, shape, or color.  You have a mole that is larger than a pencil eraser.  Always use sunscreen. Apply sunscreen liberally and repeat throughout the day.  Protect yourself by wearing long sleeves, pants, a wide-brimmed hat, and sunglasses when outside. What should I know about heart disease, diabetes, and high blood pressure?  If you are 25-58 years of age, have your blood pressure checked every 3-5  years. If you are 33 years of age or older, have your blood pressure checked every year. You should have your blood pressure measured twice-once when you are at a hospital or clinic, and once when you are not at a hospital or clinic. Record the average of the two measurements. To check your blood pressure when you are not at a hospital or clinic, you can use:  An automated blood pressure machine at a pharmacy.  A home blood pressure monitor.  Talk to your health care provider about your target blood pressure.  If you are between 46-36 years old, ask your health care provider if you should take aspirin to prevent heart disease.  Have regular diabetes screenings by checking your fasting blood sugar level.  If you are at a normal weight and have a low risk for diabetes, have this test once every three years after the age of 22.  If you are overweight and have a high risk for diabetes, consider being tested at a younger age or more often.  A one-time screening for abdominal aortic aneurysm (AAA) by ultrasound is recommended for men aged 48-75 years who are current or former smokers. What should I know about preventing infection? Hepatitis B  If you have a higher risk for hepatitis B, you should be screened for this virus. Talk with your health care provider to find out if you are at risk for hepatitis B infection. Hepatitis C  Blood testing is recommended for:  Everyone born from [birth year] through 1966.  Anyone with known risk factors for hepatitis C. Sexually Transmitted Diseases (STDs)  You should be screened each year for STDs including gonorrhea and chlamydia if:  You are sexually active and are younger than 52 years of age.  You are older than 52 years of age and your health care provider tells you that you are at risk for this type of infection.  Your sexual activity has changed since you were last screened and you are at an increased risk for chlamydia or gonorrhea. Ask your health  care provider if you are at risk.  Talk with your health care provider about whether you are at high risk of being infected with HIV. Your health care provider may recommend a prescription medicine to help prevent HIV infection. What else can I do?  Schedule regular health, dental, and eye exams.  Stay current with your vaccines (immunizations).  Do not use any tobacco products, such as cigarettes, chewing tobacco, and e-cigarettes. If you need help quitting, ask your health care provider.  Limit alcohol intake to no more than 2 drinks per day. One drink equals 12 ounces of beer, 5 ounces of wine, or 1 ounces of hard liquor.  Do not use street drugs.  Do not share needles.  Ask your  health care provider for help if you need support or information about quitting drugs.  Tell your health care provider if you often feel depressed.  Tell your health care provider if you have ever been abused or do not feel safe at home. This information is not intended to replace advice given to you by your health care provider. Make sure you discuss any questions you have with your health care provider. Document Released: 04/13/2008 Document Revised: 06/14/2016 Document Reviewed: 07/20/2015 Elsevier Interactive Patient Education  2017 [employer].

## 2017-02-14 NOTE — Progress Notes (Signed)
Pre visit review using our clinic review tool, if applicable. No additional management support is needed unless otherwise documented below in the visit note. 

## 2017-02-14 NOTE — Assessment & Plan Note (Signed)
STARTING LOSARTAM 50 MG ,  PROTEINURIA NOTED TOO

## 2017-02-15 ENCOUNTER — Telehealth: Payer: Self-pay | Admitting: Internal Medicine

## 2017-02-15 MED ORDER — PRAVASTATIN SODIUM 20 MG PO TABS
20.0000 mg | ORAL_TABLET | Freq: Every day | ORAL | 0 refills | Status: DC
Start: 2017-02-15 — End: 2017-05-09

## 2017-02-15 NOTE — Telephone Encounter (Signed)
Pt is at the pharmacy to pick up a Rx for provastatin. No Rx is at the pharmacy. Please advise?  Please call pharmacy once sent.  [place], [place]. Thank you!

## 2017-02-15 NOTE — Telephone Encounter (Signed)
RX XENT.  NEEDS  NON FASTING LAB VISIT IN 3 WEEK S CONFIRM THAT HE HAS AN  APPT

## 2017-02-15 NOTE — Telephone Encounter (Signed)
Spoke with the pharmacy to confirm they received the RX and attempted to reach the patient to let him know. Left a VM.

## 2017-02-15 NOTE — Telephone Encounter (Signed)
Has appt on May thanks

## 2017-02-15 NOTE — Telephone Encounter (Signed)
Patient was seen yesterday at [facility], per the OV you wanted him to start the Pravastatin.  I don't see this on current or old medication list, please advise for dose and I can send to the pharmacy, thanks

## 2017-02-16 ENCOUNTER — Telehealth: Payer: Self-pay | Admitting: *Deleted

## 2017-02-16 MED ORDER — LEVOTHYROXINE SODIUM 150 MCG PO TABS: 150.0000 ug | ORAL_TABLET | Freq: Every day | ORAL | 3 refills | Status: DC

## 2017-02-16 NOTE — Telephone Encounter (Signed)
Patient has been informed of Dr.Tullo's statement and understands

## 2017-02-16 NOTE — Telephone Encounter (Signed)
Patients wife picked up a Rx for Synthroid 137 mg, pt was under the impression that he was to have a increase in medication. He questioned if he should take this dose, considering he paid for medication . Contact  Langley Gauss [phone]

## 2017-02-16 NOTE — Telephone Encounter (Signed)
Please advise 

## 2017-02-16 NOTE — Telephone Encounter (Signed)
Pharmacy has been informed.

## 2017-02-16 NOTE — Telephone Encounter (Signed)
Lab Results  Component Value Date   TSH 4.66 (H) 02/09/2017   Yes he can  Take the 137 mcg dose and  just take one extra dose per week on sundays.   This will raise his  Daily dose by about 20 mcg ,  And I will send the new one for 150 mcg for next  90 days

## 2017-02-17 DIAGNOSIS — K219 Gastro-esophageal reflux disease without esophagitis: Secondary | ICD-10-CM | POA: Insufficient documentation

## 2017-02-17 DIAGNOSIS — Z970 Presence of artificial eye: Secondary | ICD-10-CM | POA: Insufficient documentation

## 2017-02-17 NOTE — Assessment & Plan Note (Signed)
Annual comprehensive preventive exam was done as well as an evaluation and management of acute and chronic conditions .  During the course of the visit the patient was educated and counseled about appropriate screening and preventive services including :  diabetes screening, lipid analysis with projected  10 year  risk for CAD , nutrition counseling, prostate and colorectal cancer screening, and recommended immunizations.  Printed recommendations for health maintenance screenings was given.   Lab Results  Component Value Date   PSA 0.38 12/03/2015

## 2017-02-17 NOTE — Assessment & Plan Note (Signed)
Secondary to left eye trauma remotely.  No issues

## 2017-02-17 NOTE — Assessment & Plan Note (Signed)
starting losartan 50 mg daily.  rtc one week for bment and bp check

## 2017-02-17 NOTE — Assessment & Plan Note (Signed)
Patient is advised to start statin therapy  In light of  elevated risk for cardiac event and ACC recommendations of statin therapy .Pravastatin 20 mg daily,  rtc 3 weeks for liver tests

## 2017-02-17 NOTE — Assessment & Plan Note (Signed)
Discussed current controversy regarding prolonged use of PPI in patients without documented Barretts esophagus.  Patient has no prior EGD but has been on PPI therapy for > 5 years (per patient).  Suggested trial of pepcid 20 mg daily.  If GERD symptoms return,  advised him to accept referral for EGD.

## 2017-02-17 NOTE — Assessment & Plan Note (Signed)
Patient's thyroid function is underactive on current levothyroxine dose of 88 mcg daily.  Principles of proper administration reviewed; patient has not missed more than one dose in the last 6 weeks and is taking the medication alone on an empty stomach.  Will increase dose to 150 mcg daily with next refill,  Since his medication was just just refilled at 137l,  I will have hi take one extra dose per week until he runs out

## 2017-03-07 ENCOUNTER — Other Ambulatory Visit (INDEPENDENT_AMBULATORY_CARE_PROVIDER_SITE_OTHER): Payer: Self-pay

## 2017-03-07 DIAGNOSIS — Z125 Encounter for screening for malignant neoplasm of prostate: Secondary | ICD-10-CM

## 2017-03-07 DIAGNOSIS — Z79899 Other long term (current) drug therapy: Secondary | ICD-10-CM

## 2017-03-07 LAB — COMPREHENSIVE METABOLIC PANEL
ALT: 19 U/L (ref 0–53)
AST: 16 U/L (ref 0–37)
Albumin: 4.5 g/dL (ref 3.5–5.2)
Alkaline Phosphatase: 51 U/L (ref 39–117)
BUN: 15 mg/dL (ref 6–23)
CHLORIDE: 106 meq/L (ref 96–112)
CO2: 29 meq/L (ref 19–32)
Calcium: 9.4 mg/dL (ref 8.4–10.5)
Creatinine, Ser: 1.24 mg/dL (ref 0.40–1.50)
GFR: 65.08 mL/min (ref >60.00)
GLUCOSE: 99 mg/dL (ref 70–99)
POTASSIUM: 4.5 meq/L (ref 3.5–5.1)
Sodium: 140 mEq/L (ref 135–145)
Total Bilirubin: 0.7 mg/dL (ref 0.2–1.2)
Total Protein: 6.3 g/dL (ref 6.0–8.3)

## 2017-03-07 LAB — PSA: PSA: 0.3 ng/mL (ref 0.10–4.00)

## 2017-03-10 ENCOUNTER — Encounter: Payer: Self-pay | Admitting: Internal Medicine

## 2017-03-29 ENCOUNTER — Other Ambulatory Visit: Payer: Self-pay | Admitting: Internal Medicine

## 2017-05-09 ENCOUNTER — Other Ambulatory Visit: Payer: Self-pay | Admitting: Internal Medicine

## 2017-07-09 ENCOUNTER — Other Ambulatory Visit: Payer: Self-pay | Admitting: Internal Medicine

## 2017-09-17 ENCOUNTER — Other Ambulatory Visit: Payer: Self-pay | Admitting: Internal Medicine

## 2018-01-21 ENCOUNTER — Encounter: Payer: Self-pay | Admitting: Internal Medicine

## 2018-01-21 ENCOUNTER — Ambulatory Visit (INDEPENDENT_AMBULATORY_CARE_PROVIDER_SITE_OTHER): Payer: Self-pay | Admitting: Internal Medicine

## 2018-01-21 VITALS — BP 158/96 | HR 72 | Temp 97.9°F | Resp 15 | Ht 70.0 in | Wt 200.0 lb

## 2018-01-21 DIAGNOSIS — Z970 Presence of artificial eye: Secondary | ICD-10-CM

## 2018-01-21 DIAGNOSIS — Z716 Tobacco abuse counseling: Secondary | ICD-10-CM

## 2018-01-21 DIAGNOSIS — E785 Hyperlipidemia, unspecified: Secondary | ICD-10-CM

## 2018-01-21 DIAGNOSIS — D492 Neoplasm of unspecified behavior of bone, soft tissue, and skin: Secondary | ICD-10-CM

## 2018-01-21 DIAGNOSIS — Z125 Encounter for screening for malignant neoplasm of prostate: Secondary | ICD-10-CM

## 2018-01-21 DIAGNOSIS — D171 Benign lipomatous neoplasm of skin and subcutaneous tissue of trunk: Secondary | ICD-10-CM

## 2018-01-21 DIAGNOSIS — I10 Essential (primary) hypertension: Secondary | ICD-10-CM

## 2018-01-21 DIAGNOSIS — Z0000 Encounter for general adult medical examination without abnormal findings: Secondary | ICD-10-CM

## 2018-01-21 DIAGNOSIS — Z8701 Personal history of pneumonia (recurrent): Secondary | ICD-10-CM

## 2018-01-21 DIAGNOSIS — E039 Hypothyroidism, unspecified: Secondary | ICD-10-CM

## 2018-01-21 DIAGNOSIS — G4762 Sleep related leg cramps: Secondary | ICD-10-CM

## 2018-01-21 DIAGNOSIS — Z789 Other specified health status: Secondary | ICD-10-CM

## 2018-01-21 LAB — COMPREHENSIVE METABOLIC PANEL
ALBUMIN: 4.6 g/dL (ref 3.5–5.2)
ALT: 36 U/L (ref 0–53)
AST: 22 U/L (ref 0–37)
Alkaline Phosphatase: 54 U/L (ref 39–117)
BUN: 24 mg/dL — AB (ref 6–23)
CHLORIDE: 102 meq/L (ref 96–112)
CO2: 27 meq/L (ref 19–32)
CREATININE: 1.19 mg/dL (ref 0.40–1.50)
Calcium: 9.9 mg/dL (ref 8.4–10.5)
GFR: 68.02 mL/min (ref >60.00)
GLUCOSE: 97 mg/dL (ref 70–99)
POTASSIUM: 5.1 meq/L (ref 3.5–5.1)
SODIUM: 138 meq/L (ref 135–145)
Total Bilirubin: 1 mg/dL (ref 0.2–1.2)
Total Protein: 7.3 g/dL (ref 6.0–8.3)

## 2018-01-21 LAB — PSA: PSA: 0.52 ng/mL (ref 0.10–4.00)

## 2018-01-21 LAB — LIPID PANEL
CHOLESTEROL: 194 mg/dL (ref 0–200)
HDL: 44.1 mg/dL (ref >39.00)
LDL CALC: 132 mg/dL — AB (ref 0–99)
NONHDL: 150.16
Total CHOL/HDL Ratio: 4
Triglycerides: 90 mg/dL (ref 0.0–149.0)
VLDL: 18 mg/dL (ref 0.0–40.0)

## 2018-01-21 MED ORDER — LOSARTAN POTASSIUM 50 MG PO TABS: 50.0000 mg | ORAL_TABLET | Freq: Every day | ORAL | 1 refills | Status: DC

## 2018-01-21 NOTE — Patient Instructions (Addendum)
The new goals for optimal blood pressure management are 120/70.  Please check your blood pressure a few times at home and send me the readings so I can determine if you need a change in medication   Please resume the losartan (or the alternative [facility] has BEEN INSTRUCTED TO SUBSTITUTE)   Referral to Dr Nehemiah Massed is in progress to look at the facial spot   Health Maintenance, Male A healthy lifestyle and preventive care is important for your health and wellness. Ask your health care provider about what schedule of regular examinations is right for you. What should I know about weight and diet? Eat a Healthy Diet  Eat plenty of vegetables, fruits, whole grains, low-fat dairy products, and lean protein.  Do not eat a lot of foods high in solid fats, added sugars, or salt.  Maintain a Healthy Weight Regular exercise can help you achieve or maintain a healthy weight. You should:  Do at least 150 minutes of exercise each week. The exercise should increase your heart rate and make you sweat (moderate-intensity exercise).  Do strength-training exercises at least twice a week.  Watch Your Levels of Cholesterol and Blood Lipids  Have your blood tested for lipids and cholesterol every 5 years starting at 53 years of age. If you are at high risk for heart disease, you should start having your blood tested when you are 53 years old. You may need to have your cholesterol levels checked more often if: ? Your lipid or cholesterol levels are high. ? You are older than 53 years of age. ? You are at high risk for heart disease.  What should I know about cancer screening? Many types of cancers can be detected early and may often be prevented. Lung Cancer  You should be screened every year for lung cancer if: ? You are a current smoker who has smoked for at least 30 years. ? You are a former smoker who has quit within the past 15 years.  Talk to your health care provider about your screening  options, when you should start screening, and how often you should be screened.  Colorectal Cancer  Routine colorectal cancer screening usually begins at 53 years of age and should be repeated every 5-10 years until you are 53 years old. You may need to be screened more often if early forms of precancerous polyps or small growths are found. Your health care provider may recommend screening at an earlier age if you have risk factors for colon cancer.  Your health care provider may recommend using home test kits to check for hidden blood in the stool.  A small camera at the end of a tube can be used to examine your colon (sigmoidoscopy or colonoscopy). This checks for the earliest forms of colorectal cancer.  Prostate and Testicular Cancer  Depending on your age and overall health, your health care provider may do certain tests to screen for prostate and testicular cancer.  Talk to your health care provider about any symptoms or concerns you have about testicular or prostate cancer.  Skin Cancer  Check your skin from head to toe regularly.  Tell your health care provider about any new moles or changes in moles, especially if: ? There is a change in a mole's size, shape, or color. ? You have a mole that is larger than a pencil eraser.  Always use sunscreen. Apply sunscreen liberally and repeat throughout the day.  Protect yourself by wearing long sleeves, pants, a  wide-brimmed hat, and sunglasses when outside.  What should I know about heart disease, diabetes, and high blood pressure?  If you are 60-70 years of age, have your blood pressure checked every 3-5 years. If you are 8 years of age or older, have your blood pressure checked every year. You should have your blood pressure measured twice-once when you are at a hospital or clinic, and once when you are not at a hospital or clinic. Record the average of the two measurements. To check your blood pressure when you are not at a hospital  or clinic, you can use: ? An automated blood pressure machine at a pharmacy. ? A home blood pressure monitor.  Talk to your health care provider about your target blood pressure.  If you are between 51-107 years old, ask your health care provider if you should take aspirin to prevent heart disease.  Have regular diabetes screenings by checking your fasting blood sugar level. ? If you are at a normal weight and have a low risk for diabetes, have this test once every three years after the age of 75. ? If you are overweight and have a high risk for diabetes, consider being tested at a younger age or more often.  A one-time screening for abdominal aortic aneurysm (AAA) by ultrasound is recommended for men aged 87-75 years who are current or former smokers. What should I know about preventing infection? Hepatitis B If you have a higher risk for hepatitis B, you should be screened for this virus. Talk with your health care provider to find out if you are at risk for hepatitis B infection. Hepatitis C Blood testing is recommended for:  Everyone born from [birth year] through 1966.  Anyone with known risk factors for hepatitis C.  Sexually Transmitted Diseases (STDs)  You should be screened each year for STDs including gonorrhea and chlamydia if: ? You are sexually active and are younger than 53 years of age. ? You are older than 53 years of age and your health care provider tells you that you are at risk for this type of infection. ? Your sexual activity has changed since you were last screened and you are at an increased risk for chlamydia or gonorrhea. Ask your health care provider if you are at risk.  Talk with your health care provider about whether you are at high risk of being infected with HIV. Your health care provider may recommend a prescription medicine to help prevent HIV infection.  What else can I do?  Schedule regular health, dental, and eye exams.  Stay current with your vaccines  (immunizations).  Do not use any tobacco products, such as cigarettes, chewing tobacco, and e-cigarettes. If you need help quitting, ask your health care provider.  Limit alcohol intake to no more than 2 drinks per day. One drink equals 12 ounces of beer, 5 ounces of wine, or 1 ounces of hard liquor.  Do not use street drugs.  Do not share needles.  Ask your health care provider for help if you need support or information about quitting drugs.  Tell your health care provider if you often feel depressed.  Tell your health care provider if you have ever been abused or do not feel safe at home. This information is not intended to replace advice given to you by your health care provider. Make sure you discuss any questions you have with your health care provider. Document Released: 04/13/2008 Document Revised: 06/14/2016 Document Reviewed: 07/20/2015 Elsevier Interactive Patient  Education  2018 [employer]  

## 2018-01-21 NOTE — Progress Notes (Signed)
Patient ID: JAICOB DIA, male    DOB: [birth year]  Age: 52 y.o. MRN: [id number]  The patient is here for annual  preventive examination and management of other chronic and acute problems.   No prior colonoscopy. cologuard negative 2017   PSA normal  Mar 07 2017    Lab Results  Component Value Date   PSA 0.52 01/21/2018   PSA 0.30 03/07/2017   PSA 0.38 12/03/2015    The risk factors are reflected in the social history.  The roster of all physicians providing medical care to patient - is listed in the Snapshot section of the chart.  Activities of daily living:  The patient is 100% independent in all ADLs: dressing, toileting, feeding as well as independent mobility  Home safety : The patient has smoke detectors in the home. They wear seatbelts.  There are no firearms at home. There is no violence in the home.   There is no risks for hepatitis, STDs or HIV. There is no   history of blood transfusion. They have no travel history to infectious disease endemic areas of the world.  The patient has seen their dentist in the last six month. They have seen their eye doctor in the last year. They admit to slight hearing difficulty with regard to whispered voices and some television programs.  They have deferred audiologic testing in the last year.  They do not  have excessive sun exposure. Discussed the need for sun protection: hats, long sleeves and use of sunscreen if there is significant sun exposure.   Diet: the importance of a healthy diet is discussed. They do have a healthy diet.  The benefits of regular aerobic exercise were discussed. She walks 4 times per week ,  20 minutes.   Depression screen: there are no signs or vegative symptoms of depression- irritability, change in appetite, anhedonia, sadness/tearfullness.  Cognitive assessment: the patient manages all their financial and personal affairs and is actively engaged. They could relate day,date,year and events; recalled 2/3  objects at 3 minutes; performed clock-face test normally.  The following portions of the patient's history were reviewed and updated as appropriate: allergies, current medications, past family history, past medical history,  past surgical history, past social history  and problem list.  Visual acuity was not assessed per patient preference since she has regular follow up with her ophthalmologist. Hearing and body mass index were assessed and reviewed.   During the course of the visit the patient was educated and counseled about appropriate screening and preventive services including : fall prevention , diabetes screening, nutrition counseling, colorectal cancer screening, and recommended immunizations.    CC: The primary encounter diagnosis was Encounter for preventive health examination. Diagnoses of Hyperlipidemia LDL goal <100, Prostate cancer screening, Essential hypertension, Tobacco abuse counseling, History of bacterial pneumonia, Hypothyroidism (acquired), Statin intolerance, Prosthetic eye globe, Leg cramps, sleep related, Skin neoplasm, and Lipoma of back were also pertinent to this visit.  1) staitn intolerance:  Developed Severe myalgais on pravastatin.  Resolved after 2 weeks of suspension  2) uncontrolled hypertension due to patient stopping losartan due to recall and not calling  Office or pharmacy  3) TREATED FOR PNEUMONIA IN January,  HAS REDUCED CIGARETTE USE SINCE THEN BUT NOT COMPLETELY QUIT  4) LEG CRAMPS HAVE RETURNED, involves both  THIGHS.  RINKS WATER AND several beers per night (over 3 )   5) WIFE IS BOTHERED BY LIPOMA ON HIS BACK,  LEFT SIDE,  HAS SLIGHTLY ENLARGED. No  pain   History Eusebio has a past medical history of hypothyroidism.   He has a past surgical history that includes Eye surgery (1987) and Knee arthroscopy (2003).   His family history includes Alcohol abuse in his father; Aneurysm in his mother; COPD in his mother; Dementia in his maternal aunt;  Diabetes in his sister; Early death in his sister; Heart disease (age of onset: 24) in his father; Stroke (age of onset: 17) in his mother.He reports that he has been smoking cigarettes.  He has been smoking about 0.50 packs per day. He has never used smokeless tobacco. He reports that he drinks about 7.2 oz of alcohol per week. He reports that he does not use drugs.  Outpatient Medications Prior to Visit  Medication Sig Dispense Refill  . levothyroxine (SYNTHROID, LEVOTHROID) 150 MCG tablet Take 1 tablet (150 mcg total) by mouth daily. 90 tablet 3  . ALPRAZolam (XANAX) 0.25 MG tablet Take 1 tablet (0.25 mg total) by mouth at bedtime as needed. (Patient not taking: Reported on 01/21/2018) 30 tablet 3  . losartan (COZAAR) 50 MG tablet TAKE 1 TABLET EVERY DAY (Patient not taking: Reported on 01/21/2018) 30 tablet 3  . pravastatin (PRAVACHOL) 20 MG tablet TAKE ONE TABLET BY MOUTH EVERY DAY (Patient not taking: Reported on 01/21/2018) 90 tablet 1   Facility-Administered Medications Prior to Visit  Medication Dose Route Frequency Provider Last Rate Last Dose  . lidocaine (XYLOCAINE) 2 % (with pres) injection 30 mg  1.5 mL Other Once Crecencio Mc, MD      . triamcinolone acetonide (KENALOG-40) injection 20 mg  20 mg Intra-articular Once Crecencio Mc, MD        Review of Systems   Patient denies headache, fevers, malaise, unintentional weight loss, skin rash, eye pain, sinus congestion and sinus pain, sore throat, dysphagia,  hemoptysis , cough, dyspnea, wheezing, chest pain, palpitations, orthopnea, edema, abdominal pain, nausea, melena, diarrhea, constipation, flank pain, dysuria, hematuria, urinary  Frequency, nocturia, numbness, tingling, seizures,  Focal weakness, Loss of consciousness,  Tremor, insomnia, depression, anxiety, and suicidal ideation.      Objective:  BP (!) 158/96 (BP Location: Left Arm, Patient Position: Sitting, Cuff Size: Normal)   Pulse 72   Temp 97.9 F (36.6 C) (Oral)    Resp 15   Ht 5\' 10"  (1.778 m)   Wt 200 lb (90.7 kg)   SpO2 98%   BMI 28.70 kg/m   Physical Exam   General appearance: alert, cooperative and appears stated age Ears: normal TM's and external ear canals both ears Throat: lips, mucosa, and tongue normal; teeth and gums normal Neck: no adenopathy, no carotid bruit, supple, symmetrical, trachea midline and thyroid not enlarged, symmetric, no tenderness/mass/nodules Back: symmetric, no curvature. ROM normal. No CVA tenderness. Lungs: clear to auscultation bilaterally Heart: regular rate and rhythm, S1, S2 normal, no murmur, click, rub or gallop Abdomen: soft, non-tender; bowel sounds normal; no masses,  no organomegaly Pulses: 2+ and symmetric Skin: hyperpigmented macule with irregular border on right upper forehead near hairline.  Lipoma of left lower back above waist.   Sun damaged skin noted in all exposed areas  Lymph nodes: Cervical, supraclavicular, and axillary nodes normal.    Assessment & Plan:   Problem List Items Addressed This Visit    Tobacco abuse counseling    He has reduced his daily use since he was treated for pneumonia in January by an urgent care doctor.        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  Reassurance provided.       Leg cramps, sleep related    Sodium and potassium levels are normal.  Advised to try magnesium supplements or mustard/turmeric   Lab Results  Component Value Date   CREATININE 1.19 01/21/2018   Lab Results  Component Value Date   NA 138 01/21/2018   K 5.1 01/21/2018   CL 102 01/21/2018   CO2 27 01/21/2018         Hypothyroidism (acquired)    Patient's thyroid function was  underactive  on current levothyroxine dose of 137 mcg daily and dose was increased to 150 mcg .    Lab Results  Component Value Date   TSH 4.66 (H) 02/09/2017         Relevant Orders   TSH   Hyperlipidemia LDL goal <100    Untreated currently due to trial of pravastatin causing severe myalgias. He is unwilling to consider alternative therapy   Lab Results  Component Value Date   CHOL 194 01/21/2018   HDL 44.10 01/21/2018   LDLCALC 132 (H) 01/21/2018   LDLDIRECT 145.0 12/03/2015   TRIG 90.0 01/21/2018   CHOLHDL 4 01/21/2018         Relevant Medications   losartan (COZAAR) 50 MG tablet   Other Relevant Orders   Lipid panel (Completed)   History of bacterial pneumonia    He has reduced his daily use of tobacco  since he was treated for pneumonia in January 2019 by an urgent care doctor.  He does not remember if he was told to return for follow up chest x ray. Records requested        Essential hypertension    Uncontrolled due to losartan recall.  Resume therapy .       Relevant Medications   losartan (COZAAR) 50 MG tablet   Other Relevant Orders   Comprehensive metabolic panel (Completed)   Encounter for preventive health examination - Primary    Annual comprehensive preventive exam was done as well as an evaluation and management of acute and chronic conditions .  During the course of the visit the patient was educated and counseled about appropriate screening and preventive services including :  diabetes screening, lipid analysis with projected  10 year  risk for CAD , nutrition counseling, prostate and colorectal cancer screening, and recommended immunizations.  Printed recommendations for health maintenance screenings was given.        Other Visit Diagnoses    Prostate cancer screening       Relevant Orders   PSA (Completed)      I have discontinued Pope D. Degraffenreid's pravastatin. I have also changed his losartan. Additionally, I am having him maintain his ALPRAZolam and  levothyroxine. We will continue to administer lidocaine and triamcinolone acetonide.  Meds ordered this encounter  Medications  . losartan (COZAAR) 50 MG tablet    Sig: Take 1 tablet (50 mg total) by mouth daily.    Dispense:  90 tablet    Refill:  1    Ok to substitute telmisartan 40 mg if necessary  Medications Discontinued During This Encounter  Medication Reason  . losartan (COZAAR) 50 MG tablet Reorder  . pravastatin (PRAVACHOL) 20 MG tablet Side effect (s)    Follow-up: No follow-ups on file.   Crecencio Mc, MD

## 2018-01-22 ENCOUNTER — Other Ambulatory Visit (INDEPENDENT_AMBULATORY_CARE_PROVIDER_SITE_OTHER): Payer: Self-pay

## 2018-01-22 ENCOUNTER — Other Ambulatory Visit: Payer: Self-pay | Admitting: Internal Medicine

## 2018-01-22 DIAGNOSIS — E039 Hypothyroidism, unspecified: Secondary | ICD-10-CM

## 2018-01-22 DIAGNOSIS — D492 Neoplasm of unspecified behavior of bone, soft tissue, and skin: Secondary | ICD-10-CM | POA: Insufficient documentation

## 2018-01-22 DIAGNOSIS — Z789 Other specified health status: Secondary | ICD-10-CM | POA: Insufficient documentation

## 2018-01-22 DIAGNOSIS — Z8701 Personal history of pneumonia (recurrent): Secondary | ICD-10-CM | POA: Insufficient documentation

## 2018-01-22 DIAGNOSIS — D171 Benign lipomatous neoplasm of skin and subcutaneous tissue of trunk: Secondary | ICD-10-CM | POA: Insufficient documentation

## 2018-01-22 LAB — TSH: TSH: 0.14 u[IU]/mL — AB (ref 0.35–4.50)

## 2018-01-22 MED ORDER — LEVOTHYROXINE SODIUM 137 MCG PO TABS: 137.0000 ug | ORAL_TABLET | Freq: Every day | ORAL | 0 refills | Status: DC

## 2018-01-22 NOTE — Assessment & Plan Note (Signed)
He has reduced his daily use of tobacco  since he was treated for pneumonia in January 2019 by an urgent care doctor.  He does not remember if he was told to return for follow up chest x ray. Records requested

## 2018-01-22 NOTE — Assessment & Plan Note (Signed)
Dose lowered to 137 for suppressed TSH on 150 mcg daly dose  .  Recheck 6 weeks

## 2018-01-22 NOTE — Assessment & Plan Note (Signed)
Referring to [facility] for evaluation of dysplastic nevus on right forehead

## 2018-01-22 NOTE — Assessment & Plan Note (Signed)
Untreated currently due to trial of pravastatin causing severe myalgias. He is unwilling to consider alternative therapy   Lab Results  Component Value Date   CHOL 194 01/21/2018   HDL 44.10 01/21/2018   LDLCALC 132 (H) 01/21/2018   LDLDIRECT 145.0 12/03/2015   TRIG 90.0 01/21/2018   CHOLHDL 4 01/21/2018

## 2018-01-22 NOTE — Assessment & Plan Note (Signed)
He has reduced his daily use since he was treated for pneumonia in January by an urgent care doctor.

## 2018-01-22 NOTE — Assessment & Plan Note (Signed)
Uncontrolled due to losartan recall.  Resume therapy .

## 2018-01-22 NOTE — Assessment & Plan Note (Signed)
Sodium and potassium levels are normal.  Advised to try magnesium supplements or mustard/turmeric   Lab Results  Component Value Date   CREATININE 1.19 01/21/2018   Lab Results  Component Value Date   NA 138 01/21/2018   K 5.1 01/21/2018   CL 102 01/21/2018   CO2 27 01/21/2018

## 2018-01-22 NOTE — Assessment & Plan Note (Signed)

## 2018-01-22 NOTE — Assessment & Plan Note (Signed)
Patient's thyroid function was  underactive on current levothyroxine dose of 137 mcg daily and dose was increased to 150 mcg .    Lab Results  Component Value Date   TSH 4.66 (H) 02/09/2017

## 2018-01-22 NOTE — Assessment & Plan Note (Signed)
Asymptomatic ,  Left flank.  Reassurance provided.

## 2018-01-22 NOTE — Assessment & Plan Note (Signed)
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

## 2018-01-23 ENCOUNTER — Encounter: Payer: Self-pay | Admitting: Internal Medicine

## 2018-02-15 ENCOUNTER — Encounter: Payer: Self-pay | Admitting: Internal Medicine

## 2018-04-04 ENCOUNTER — Encounter: Payer: Self-pay | Admitting: Emergency Medicine

## 2018-04-04 ENCOUNTER — Other Ambulatory Visit: Payer: Self-pay

## 2018-04-04 ENCOUNTER — Ambulatory Visit
Admission: EM | Admit: 2018-04-04 | Discharge: 2018-04-04 | Disposition: A | Discharge status: Home / Self Care | Payer: Self-pay | Attending: Family Medicine | Admitting: Family Medicine

## 2018-04-04 DIAGNOSIS — K148 Other diseases of tongue: Secondary | ICD-10-CM

## 2018-04-04 HISTORY — DX: Gastro-esophageal reflux disease without esophagitis: K21.9

## 2018-04-04 HISTORY — DX: Blindness, one eye, unspecified eye: H54.40

## 2018-04-04 HISTORY — DX: Essential (primary) hypertension: I10

## 2018-04-04 MED ORDER — MAGIC MOUTHWASH: 5.0000 mL | Freq: Three times a day (TID) | ORAL | 0 refills | Status: DC | PRN

## 2018-04-04 NOTE — ED Triage Notes (Signed)
Patient in today c/o skin peeling off his tongue x today. Patient states he has had the skin peel off of the roof of his mouth off & on x 7 months.

## 2018-04-04 NOTE — Discharge Instructions (Signed)
This is benign.  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  See ENT if persists.  Take care  Dr. Lacinda Axon

## 2018-04-04 NOTE — ED Provider Notes (Signed)
[facility]   CSN: [id number] Arrival date & time: 04/04/18  1509  History   Chief Complaint Chief Complaint  Patient presents with  . skin peeling off tongue   HPI  52 year old male presents with the above complaint.  Patient reports that earlier today he was eating lunch.  He noticed some burning on his tongue.  He looked at his tongue and noted that the "skin was peeling off".  He states that this is happened to the roof of his mouth intermittently over the past several months.  He states that his wife looked on the Internet and told him that he should come in for evaluation.  Other than the slight burning but has had is not terribly troublesome.  He is a smoker.  No known relieving factors.  No new medications.  No new exposures.  No other associated symptoms.  No other complaints.  Past Medical History:  Diagnosis Date  . Blindness of left eye   . GERD (gastroesophageal reflux disease)   . Hypertension   . hypothyroidism    Patient Active Problem List   Diagnosis Date Noted  . History of bacterial pneumonia 01/22/2018  . Statin intolerance 01/22/2018  . Skin neoplasm 01/22/2018  . Lipoma of back 01/22/2018  . 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 GERD without esophagitis 02/17/2017  . Olecranon bursitis 12/26/2016  . Tobacco abuse counseling 05/22/2016  . Essential hypertension 11/27/2015  . Chronic frontal sinusitis 04/03/2015  . Cervicalgia of occipito-atlanto-axial region 09/19/2014  . Anxiety state, unspecified 02/14/2014  . Hyperlipidemia LDL goal <100 02/14/2014  . Allergic rhinitis 11/15/2013  . Encounter for preventive health examination 02/14/2013  . Leg cramps, sleep related 02/12/2013  . Hypothyroidism (acquired) 10/08/2011   Past Surgical History:  Procedure Laterality Date  . 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 KNEE ARTHROSCOPY  2003   right , with residual numbness medial side of calf    Home  Medications    Prior to Admission medications   Medication Sig Start Date End Date Taking? Authorizing Provider  levothyroxine (SYNTHROID, LEVOTHROID) 137 MCG tablet Take 1 tablet (137 mcg total) by mouth daily. 01/22/18  Yes Crecencio Mc, MD  losartan (COZAAR) 50 MG tablet Take 1 tablet (50 mg total) by mouth daily. 01/21/18  Yes Crecencio Mc, MD  magic mouthwash SOLN Take 5 mLs by mouth 3 (three) times daily as needed for mouth pain. 04/04/18   Coral Spikes, DO   Family History Family History  Problem Relation Age of Onset  . Stroke Mother 83       during pregnancy  . Aneurysm Mother   . COPD Mother   . Heart disease Father 76       smoker  . Alcohol abuse Father   . Early death Sister        cirrhosis  . Diabetes Sister   . Dementia Maternal Aunt    Social History Social History   Tobacco Use  . Smoking status: Current Every Day Smoker    Packs/day: 0.50    Types: Cigarettes  . Smokeless tobacco: Never Used  . Tobacco comment: recently restarted smoking  Substance Use Topics  . Alcohol use: Yes    Alcohol/week: 7.2 oz    Types: 12 Cans of beer per week  . Drug use: No   Allergies   Codeine  Review of Systems Review of Systems  Constitutional:  Negative.   HENT:       Tongue lesion.    Physical Exam Triage Vital Signs ED Triage Vitals [04/04/18 1523]  Enc Vitals Group     BP (!) 129/92     Pulse Rate 85     Resp 16     Temp (!) 97.5 F (36.4 C)     Temp Source Oral     SpO2 98 %     Weight 200 lb (90.7 kg)     Height 5\' 10"  (1.778 m)     Head Circumference      Peak Flow      Pain Score 0     Pain Loc      Pain Edu?      Excl. in [place]?    Updated Vital Signs BP (!) 129/92 (BP Location: Left Arm)   Pulse 85   Temp (!) 97.5 F (36.4 C) (Oral)   Resp 16   Ht 5\' 10"  (1.778 m)   Wt 200 lb (90.7 kg)   SpO2 98%   BMI 28.70 kg/m   Physical Exam  Constitutional: He is oriented to person, place, and time. He appears well-developed. No distress.   HENT:  Head: Normocephalic and atraumatic.  Mouth/Throat: Oropharynx is clear and moist.    Red area with white border. Loss of papillae. Appears consistent with geographic tongue.  Eyes: Conjunctivae are normal. Right eye exhibits no discharge. Left eye exhibits no discharge.  Pulmonary/Chest: Effort normal. No respiratory distress.  Neurological: He is alert and oriented to person, place, and time.  Psychiatric: He has a normal mood and affect. His behavior is normal.  Nursing note and vitals reviewed.  UC Treatments / Results  Labs (all labs ordered are listed, but only abnormal results are displayed) Labs Reviewed - No data to display  EKG None  Radiology No results found.  Procedures Procedures (including critical care time)  Medications Ordered in UC Medications - No data to display  Initial Impression / Assessment and Plan / UC Course  I have reviewed the triage vital signs and the nursing notes.  Pertinent labs & imaging results that were available during my care of the patient were reviewed by me and considered in my medical decision making (see chart for details).    52 year old male presents with a tongue lesion.  Appears to be most consistent with geographic tongue.  Treating with Magic mouthwash.  Supportive care.  Final Clinical Impressions(s) / UC Diagnoses   Final diagnoses:  Tongue lesion     Discharge Instructions     This is benign.  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  See ENT if persists.  Take care  Dr. Lacinda Axon    ED Prescriptions    Medication Sig Dispense Auth. Provider   magic mouthwash SOLN Take 5 mLs by mouth 3 (three) times daily as needed for mouth pain. 100 mL Coral Spikes, DO     Controlled Substance Prescriptions [place] Controlled Substance Registry consulted? Not Applicable   Coral Spikes, DO 04/04/18 1633

## 2018-04-23 ENCOUNTER — Other Ambulatory Visit: Payer: Self-pay | Admitting: Internal Medicine

## 2018-04-26 ENCOUNTER — Other Ambulatory Visit (INDEPENDENT_AMBULATORY_CARE_PROVIDER_SITE_OTHER): Payer: Self-pay

## 2018-04-26 DIAGNOSIS — E039 Hypothyroidism, unspecified: Secondary | ICD-10-CM

## 2018-04-26 LAB — TSH: TSH: 2.5 u[IU]/mL (ref 0.35–4.50)

## 2018-04-26 LAB — T4, FREE: FREE T4: 1.04 ng/dL (ref 0.60–1.60)

## 2018-07-19 ENCOUNTER — Other Ambulatory Visit: Payer: Self-pay | Admitting: Internal Medicine

## 2018-10-01 ENCOUNTER — Other Ambulatory Visit: Payer: Self-pay | Admitting: Internal Medicine

## 2019-01-02 ENCOUNTER — Other Ambulatory Visit: Payer: Self-pay | Admitting: Internal Medicine

## 2019-01-23 ENCOUNTER — Ambulatory Visit (INDEPENDENT_AMBULATORY_CARE_PROVIDER_SITE_OTHER): Payer: Self-pay

## 2019-01-23 ENCOUNTER — Encounter: Payer: Self-pay | Admitting: Internal Medicine

## 2019-01-23 ENCOUNTER — Ambulatory Visit: Payer: Self-pay | Admitting: Internal Medicine

## 2019-01-23 ENCOUNTER — Other Ambulatory Visit: Payer: Self-pay

## 2019-01-23 VITALS — BP 130/70 | HR 84 | Temp 97.7°F | Wt 200.2 lb

## 2019-01-23 DIAGNOSIS — E039 Hypothyroidism, unspecified: Secondary | ICD-10-CM

## 2019-01-23 DIAGNOSIS — E785 Hyperlipidemia, unspecified: Secondary | ICD-10-CM

## 2019-01-23 DIAGNOSIS — D171 Benign lipomatous neoplasm of skin and subcutaneous tissue of trunk: Secondary | ICD-10-CM

## 2019-01-23 DIAGNOSIS — R059 Cough, unspecified: Secondary | ICD-10-CM

## 2019-01-23 DIAGNOSIS — Z970 Presence of artificial eye: Secondary | ICD-10-CM

## 2019-01-23 DIAGNOSIS — Z125 Encounter for screening for malignant neoplasm of prostate: Secondary | ICD-10-CM

## 2019-01-23 DIAGNOSIS — I10 Essential (primary) hypertension: Secondary | ICD-10-CM

## 2019-01-23 DIAGNOSIS — Z0000 Encounter for general adult medical examination without abnormal findings: Secondary | ICD-10-CM

## 2019-01-23 DIAGNOSIS — R05 Cough: Secondary | ICD-10-CM

## 2019-01-23 LAB — COMPREHENSIVE METABOLIC PANEL
ALT: 24 U/L (ref 0–53)
AST: 18 U/L (ref 0–37)
Albumin: 4.6 g/dL (ref 3.5–5.2)
Alkaline Phosphatase: 57 U/L (ref 39–117)
BUN: 21 mg/dL (ref 6–23)
CO2: 26 mEq/L (ref 19–32)
Calcium: 9.4 mg/dL (ref 8.4–10.5)
Chloride: 105 mEq/L (ref 96–112)
Creatinine, Ser: 1.23 mg/dL (ref 0.40–1.50)
GFR: 61.37 mL/min (ref >60.00)
Glucose, Bld: 99 mg/dL (ref 70–99)
POTASSIUM: 4.2 meq/L (ref 3.5–5.1)
Sodium: 139 mEq/L (ref 135–145)
Total Bilirubin: 0.5 mg/dL (ref 0.2–1.2)
Total Protein: 6.7 g/dL (ref 6.0–8.3)

## 2019-01-23 LAB — LIPID PANEL
Cholesterol: 190 mg/dL (ref 0–200)
HDL: 35.3 mg/dL — ABNORMAL LOW (ref >39.00)
LDL Cholesterol: 131 mg/dL — ABNORMAL HIGH (ref 0–99)
NonHDL: 154.64
TRIGLYCERIDES: 116 mg/dL (ref 0.0–149.0)
Total CHOL/HDL Ratio: 5
VLDL: 23.2 mg/dL (ref 0.0–40.0)

## 2019-01-23 LAB — PSA: PSA: 0.35 ng/mL (ref 0.10–4.00)

## 2019-01-23 LAB — TSH: TSH: 2.94 u[IU]/mL (ref 0.35–4.50)

## 2019-01-23 NOTE — Assessment & Plan Note (Signed)
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  

## 2019-01-23 NOTE — Progress Notes (Signed)
Patient ID: Tim Owen, male    DOB: Sep 03, 1966  Age: 53 y.o. MRN: [id number]  The patient is here for annual preventive examination and management of other chronic and acute problems.   The risk factors are reflected in the social history.  The roster of all physicians providing medical care to patient - is listed in the Snapshot section of the chart.  Activities of daily living:  The patient is 100% independent in all ADLs: dressing, toileting, feeding as well as independent mobility  Home safety : The patient has smoke detectors in the home. They wear seatbelts.  There are no firearms at home. There is no violence in the home.   There is no risks for hepatitis, STDs or HIV. There is no   history of blood transfusion. They have no travel history to infectious disease endemic areas of the world.  The patient has seen their dentist in the last six month. hE HAS A UNILATERAL VISION AND A prosthetic eye and he has  seen their eye doctor in the last year. They DENY hearing difficulty with regard to whispered voices and some television programs.  They have deferred audiologic testing in the last year.  They do not  have excessive sun exposure. Discussed the need for sun protection: hats, long sleeves and use of sunscreen if there is significant sun exposure.   Diet: the importance of a healthy diet is discussed. They do have a healthy diet.  The benefits of regular aerobic exercise were discussed. He does not exercise .   Depression screen: there are no signs or vegative symptoms of depression- irritability, change in appetite, anhedonia, sadness/tearfullness.   The following portions of the patient's history were reviewed and updated as appropriate: allergies, current medications, past family history, past medical history,  past surgical history, past social history  and problem list.  Visual acuity was not assessed per patient preference since she has regular follow up with her  ophthalmologist. Hearing and body mass index were assessed and reviewed.   During the course of the visit the patient was educated a nd counseled about appropriate screening and preventive services including : fall prevention , diabetes screening, nutrition counseling, colorectal cancer screening, and recommended immunizations.    CC: The primary encounter diagnosis was Cough in adult. Diagnoses of Prosthetic eye globe, Hyperlipidemia LDL goal <100, Essential hypertension, Hypothyroidism (acquired), Prostate cancer screening, Encounter for preventive health examination, Lipoma of back, and Cough were also pertinent to this visit.  1) persistent cough,  Present since late January.  Did not have any fevers,  Body aches, or  cold symptoms,  And notes that it started after his CPE . Non productive most of the time ,  Bothers him in the morning and when he lies down at night   2) recurring of subcutaneous nodule  on lower lateral back.    3) HTN:  Taking losartan.  Advised  to change to q pm  4) Hypothyroidism: taking 137 mcg synthroid   History Tim Owen has a past medical history of Blindness of left eye, GERD (gastroesophageal reflux disease), Hypertension, and hypothyroidism.   He has a past surgical history that includes Eye surgery (1987) and Knee arthroscopy (2003).   His family history includes Alcohol abuse in his father; Aneurysm in his mother; COPD in his mother; Dementia in his maternal aunt; Diabetes in his sister; Early death in his sister; Heart disease (age of onset: 34) in his father; Stroke (age of onset: 64) in  his mother.He reports that he has been smoking cigarettes. He has been smoking about 0.50 packs per day. He has never used smokeless tobacco. He reports current alcohol use of about 12.0 standard drinks of alcohol per week. He reports that he does not use drugs.  Outpatient Medications Prior to Visit  Medication Sig Dispense Refill  . losartan (COZAAR) 50 MG tablet TAKE 1  TABLET BY MOUTH DAILY 90 tablet 1  . SYNTHROID 137 MCG tablet TAKE ONE TABLET BY MOUTH EVERY DAY 90 tablet 0  . magic mouthwash SOLN Take 5 mLs by mouth 3 (three) times daily as needed for mouth pain. (Patient not taking: Reported on 01/23/2019) 100 mL 0   Facility-Administered Medications Prior to Visit  Medication Dose Route Frequency Provider Last Rate Last Dose  . lidocaine (XYLOCAINE) 2 % (with pres) injection 30 mg  1.5 mL Other Once Crecencio Mc, MD      . triamcinolone acetonide (KENALOG-40) injection 20 mg  20 mg Intra-articular Once Crecencio Mc, MD        Review of Systems   Patient denies headache, fevers, malaise, unintentional weight loss, skin rash, eye pain, sinus congestion and sinus pain, sore throat, dysphagia,  hemoptysis ,, dyspnea, wheezing, chest pain, palpitations, orthopnea, edema, abdominal pain, nausea, melena, diarrhea, constipation, flank pain, dysuria, hematuria, urinary  Frequency, nocturia, numbness, tingling, seizures,  Focal weakness, Loss of consciousness,  Tremor, insomnia, depression, anxiety, and suicidal ideation.      Objective:  BP 130/70   Pulse 84   Temp 97.7 F (36.5 C) (Oral)   Wt 200 lb 3.2 oz (90.8 kg)   SpO2 97%   BMI 28.73 kg/m   Physical Exam  General appearance: alert, cooperative and appears stated age Ears: normal TM's and external ear canals both ears Throat: lips, mucosa, and tongue normal; teeth and gums normal Neck: no adenopathy, no carotid bruit, supple, symmetrical, trachea midline and thyroid not enlarged, symmetric, no tenderness/mass/nodules Back: symmetric, no curvature. ROM normal. No CVA tenderness. Lungs: clear to auscultation bilaterally Heart: regular rate and rhythm, S1, S2 normal, no murmur, click, rub or gallop Abdomen: soft, non-tender; bowel sounds normal; no masses,  no organomegaly Pulses: 2+ and symmetric Skin: Skin color, texture, turgor normal. No rashes or lesions Lymph nodes: Cervical,  supraclavicular, and axillary nodes normal.    Assessment & Plan:   Problem List Items Addressed This Visit    Hypothyroidism (acquired)    TSH is therapeutic,  Continue current levothyoxine dose,  Recheck in 6 montths   Lab Results  Component Value Date   TSH 2.94 01/23/2019         Relevant Orders   TSH (Completed)   Encounter for preventive health examination    age appropriate education and counseling updated, referrals for preventative services and immunizations addressed, dietary and smoking counseling addressed, most recent labs reviewed.  I have personally reviewed and have noted:  1) the patient's medical and social history 2) The pt's use of alcohol, tobacco, and illicit drugs 3) The patient's current medications and supplements 4) Functional ability including ADL's, fall risk, home safety risk, hearing and visual impairment 5) Diet and physical activities 6) Evidence for depression or mood disorder 7) The patient's height, weight, and BMI have been recorded in the chart  I have made referrals, and provided counseling and education based on review of the above      Hyperlipidemia LDL goal <100    Untreated currently due to trial of pravastatin  causing severe myalgias. He is unwilling to consider alternative therapy   Lab Results  Component Value Date   CHOL 190 01/23/2019   HDL 35.30 (L) 01/23/2019   LDLCALC 131 (H) 01/23/2019   LDLDIRECT 145.0 12/03/2015   TRIG 116.0 01/23/2019   CHOLHDL 5 01/23/2019         Relevant Orders   Lipid panel (Completed)   Essential hypertension    Well controlled on current regimen. Renal function stable, no changes today.  Lab Results  Component Value Date   CREATININE 1.23 01/23/2019   Lab Results  Component Value Date   NA 139 01/23/2019   K 4.2 01/23/2019   CL 105 01/23/2019   CO2 26 01/23/2019         Relevant Orders   Comprehensive metabolic panel (Completed)   Prosthetic eye globe    Patient  continues to have unilocular vision due to prosthetic eye       Lipoma of back    Vs sebaceous cyst .  Now symptomatic due to location. Referral to general surgeon advised .       Cough    Present over 3 months. treating for GERD,  PND and allergies to break cycle.        Other Visit Diagnoses    Cough in adult    -  Primary   Relevant Orders   DG Chest 2 View (Completed)   Prostate cancer screening       Relevant Orders   PSA (Completed)      I am having Tim Owen maintain his magic mouthwash, losartan, and Synthroid. We will continue to administer lidocaine and triamcinolone acetonide.  No orders of the defined types were placed in this encounter.   There are no discontinued medications.  Follow-up: No follow-ups on file.   Crecencio Mc, MD

## 2019-01-23 NOTE — Patient Instructions (Addendum)
Your cough may be coming from reflux, allergies, or post nasal drip (PND). Let's treat ALL OF THE ABOVE for 2-3 weeks to knock it out.   PND and allergies can be treated with benadryl at night and   Allegra, Zyrtec or claritin during the day  (. Benadryl is the most effective for drying you up but it is also the most sedating,  So try taking it at night and use one of the others in the daytime  GENRIC   Zyrtec,  is cetirizine.    generic Allegra , available generically as fexofenadine ; comes in 60 mg and 180 mg once daily strengths.    Generic Claritin :  available as loratidine .    "DM" stands for dextromethorphan which is a cough suppressant.  The best/strongest available OTC cough suppressant is Delsym and Robitussin   Get the BJS brand of soomeprazole (Nexium) at bedtime,  Along with 25 mg dipenhydramine (beneadryl)    Take 1 benadryl  and 1 Nexium  every evening about 30 mintues prior to bedtime.  Add the cough suppressant if needed  THE LUMP ON YOUR BACK IS EITHER A LIPOMA OR A SEBACEOUS CYST NEITHER ARE SERIOUS.  The cyst can become enflamed (red and tender) so if that happens,  Call for an antibiotic    During the flu/allergy season Consider using simply Milta [facility]'s Sinus Rinse  to flush your sinuses once or twice twice daily to get rid of viruses and pollen that can lead to sinus and lower respiratory infection

## 2019-01-25 NOTE — Assessment & Plan Note (Signed)
TSH is therapeutic,  Continue current levothyoxine dose,  Recheck in 6 montths   Lab Results  Component Value Date   TSH 2.94 01/23/2019

## 2019-01-25 NOTE — Assessment & Plan Note (Signed)

## 2019-01-25 NOTE — Assessment & Plan Note (Signed)
Untreated currently due to trial of pravastatin causing severe myalgias. He is unwilling to consider alternative therapy   Lab Results  Component Value Date   CHOL 190 01/23/2019   HDL 35.30 (L) 01/23/2019   LDLCALC 131 (H) 01/23/2019   LDLDIRECT 145.0 12/03/2015   TRIG 116.0 01/23/2019   CHOLHDL 5 01/23/2019

## 2019-01-26 DIAGNOSIS — R059 Cough, unspecified: Secondary | ICD-10-CM | POA: Insufficient documentation

## 2019-01-26 DIAGNOSIS — R05 Cough: Secondary | ICD-10-CM | POA: Insufficient documentation

## 2019-01-26 NOTE — Assessment & Plan Note (Signed)
Well controlled on current regimen. Renal function stable, no changes today.  Lab Results  Component Value Date   CREATININE 1.23 01/23/2019   Lab Results  Component Value Date   NA 139 01/23/2019   K 4.2 01/23/2019   CL 105 01/23/2019   CO2 26 01/23/2019

## 2019-01-26 NOTE — Assessment & Plan Note (Addendum)
Vs sebaceous cyst .  Now symptomatic due to location. Referral to general surgeon advised .

## 2019-01-26 NOTE — Assessment & Plan Note (Signed)
Present over 3 months. treating for GERD,  PND and allergies to break cycle.

## 2019-02-13 ENCOUNTER — Telehealth: Payer: Self-pay | Admitting: Family

## 2019-02-13 DIAGNOSIS — J029 Acute pharyngitis, unspecified: Secondary | ICD-10-CM

## 2019-02-13 MED ORDER — AMOXICILLIN-POT CLAVULANATE 875-125 MG PO TABS: 1.0000 | ORAL_TABLET | Freq: Two times a day (BID) | ORAL | 0 refills | Status: DC

## 2019-02-13 NOTE — Progress Notes (Signed)

## 2019-02-15 ENCOUNTER — Ambulatory Visit
Admission: EM | Admit: 2019-02-15 | Discharge: 2019-02-15 | Disposition: A | Discharge status: Home / Self Care | Payer: Self-pay | Attending: Family Medicine | Admitting: Family Medicine

## 2019-02-15 DIAGNOSIS — B370 Candidal stomatitis: Secondary | ICD-10-CM

## 2019-02-15 LAB — RAPID STREP SCREEN (MED CTR MEBANE ONLY): Streptococcus, Group A Screen (Direct): NEGATIVE

## 2019-02-15 MED ORDER — MAGIC MOUTHWASH W/LIDOCAINE: 10.0000 mL | Freq: Four times a day (QID) | ORAL | 0 refills | Status: DC

## 2019-02-15 NOTE — ED Triage Notes (Signed)
Pt states he was seen for e visit and was diagnosed with strep and given amoxicillin but now having painful gums, sores in his mouth and white tongue. States its knots on the sides of his jaw line which has only gotten worse since taking the medication. No fever reported.

## 2019-02-15 NOTE — ED Provider Notes (Signed)
[address], [place], [place] [address] [phone]   Name: Tim Owen DOB: Oct 01, 1966 MRN: [id number] CSN: [id number]  [place] date and time:  02/15/19 5956  Chief Complaint:  Sore Throat  NOTE: Prior to seeing the patient today, I have reviewed the triage nursing documentation and vital signs. Clinical staff has updated patient's PMH/PSHx, current medication list, and drug allergies/intolerances to ensure comprehensive history available to assist in medical decision making.   History:   HPI: Tim Owen is a 53 y.o. male who presents today with complaints of his tongue being white. He notes that he developed a "lie bump" (apthous ulcer) to the tip of his tongue. He noted some pain and swelling in his throat and neck. E-visit on 02/13/2019 resulted in a strep pharyngitis diagnosis and patient was prescribed a 7 day course of Augmentin. Sore throat has improved following 5 doses of the antibiotic, however patient continues to have tender sub-mandibular adenopathy. He denies fevers.   Patient able to eat and drink normally at this point. No SOB or difficulty swallowing.    Past Medical History:  Diagnosis Date  . Blindness of left eye   . GERD (gastroesophageal reflux disease)   . Hypertension   . hypothyroidism     Past Surgical History:  Procedure Laterality Date  . 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 KNEE ARTHROSCOPY  2003   right , with residual numbness medial side of calf    Family History  Problem Relation Age of Onset  . Stroke Mother 1       during pregnancy  . Aneurysm Mother   . COPD Mother   . Heart disease Father 54       smoker  . Alcohol abuse Father   . Early death Sister        cirrhosis  . Diabetes Sister   . Dementia Maternal Aunt     Social History   Socioeconomic History  . Marital status: Married    Spouse name: Not on file  . Number of children: Not on file  . Years of  education: Not on file  . Highest education level: Not on file  Occupational History  . Not on file  Social Needs  . Financial resource strain: Not on file  . Food insecurity:    Worry: Not on file    Inability: Not on file  . Transportation needs:    Medical: Not on file    Non-medical: Not on file  Tobacco Use  . Smoking status: Current Every Day Smoker    Packs/day: 0.50    Types: Cigarettes  . Smokeless tobacco: Never Used  . Tobacco comment: recently restarted smoking  Substance and Sexual Activity  . Alcohol use: Yes    Alcohol/week: 12.0 standard drinks    Types: 12 Cans of beer per week  . Drug use: No  . Sexual activity: Not on file  Lifestyle  . Physical activity:    Days per week: Not on file    Minutes per session: Not on file  . Stress: Not on file  Relationships  . Social connections:    Talks on phone: Not on file    Gets together: Not on file    Attends religious service: Not on file    Active member of club or organization: Not on file    Attends meetings of clubs or organizations: Not on file  Relationship status: Not on file  . Intimate partner violence:    Fear of current or ex partner: Not on file    Emotionally abused: Not on file    Physically abused: Not on file    Forced sexual activity: Not on file  Other Topics Concern  . Not on file  Social History Narrative  . Not on file    Patient Active Problem List   Diagnosis Date Noted  . Cough 01/26/2019  . History of bacterial pneumonia 01/22/2018  . Statin intolerance 01/22/2018  . Skin neoplasm 01/22/2018  . Lipoma of back 01/22/2018  . 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 GERD without esophagitis 02/17/2017  . Olecranon bursitis 12/26/2016  . Tobacco abuse counseling 05/22/2016  . Essential hypertension 11/27/2015  . Chronic frontal sinusitis 04/03/2015  . Cervicalgia of occipito-atlanto-axial region 09/19/2014  . Anxiety state, unspecified 02/14/2014  . Hyperlipidemia LDL goal  <100 02/14/2014  . Allergic rhinitis 11/15/2013  . Encounter for preventive health examination 02/14/2013  . Leg cramps, sleep related 02/12/2013  . Hypothyroidism (acquired) 10/08/2011    Home Medications:    Current Facility-Administered Medications for the 02/15/19 encounter [facility] Encounter)  Medication  . lidocaine (XYLOCAINE) 2 % (with pres) injection 30 mg  . triamcinolone acetonide (KENALOG-40) injection 20 mg   Current Meds  Medication Sig  . amoxicillin-clavulanate (AUGMENTIN) 875-125 MG tablet Take 1 tablet by mouth 2 (two) times daily.  [name] losartan (COZAAR) 50 MG tablet TAKE 1 TABLET BY MOUTH DAILY  . magic mouthwash SOLN Take 5 mLs by mouth 3 (three) times daily as needed for mouth pain.  [name] SYNTHROID 137 MCG tablet TAKE ONE TABLET BY MOUTH EVERY DAY    Allergies:   Codeine  Review of Systems (ROS): Review of Systems  Constitutional: Negative for chills and fever.  HENT: Positive for sore throat (improved). Negative for ear pain.        Tongue white  Eyes: Negative for pain.  Respiratory: Negative for cough and shortness of breath.   Cardiovascular: Negative for chest pain and palpitations.  Skin: Negative for color change and rash.  Hematological: Positive for adenopathy.  All other systems reviewed and are negative.    Physical Exam:  Triage Vital Signs ED Triage Vitals  Enc Vitals Group     BP 02/15/19 1006 118/87     Pulse Rate 02/15/19 1006 93     Resp 02/15/19 1006 18     Temp 02/15/19 1006 98.3 F (36.8 C)     Temp Source 02/15/19 1006 Oral     SpO2 02/15/19 1006 100 %     Weight 02/15/19 1009 200 lb (90.7 kg)     Height 02/15/19 1009 5' 9.5" (1.765 m)     Head Circumference --      Peak Flow --      Pain Score 02/15/19 1008 7     Pain Loc --      Pain Edu? --      Excl. in [place]? --     Physical Exam  Constitutional: He is oriented to person, place, and time and well-developed, well-nourished, and in no distress.  HENT:  Head:  Normocephalic and atraumatic.  Mouth/Throat: Oropharynx is clear and moist and mucous membranes are normal. No oropharyngeal exudate, posterior oropharyngeal edema, posterior oropharyngeal erythema or tonsillar abscesses.  (+) white coating to tongue. (+) gingival tenderness.   Eyes: Pupils are equal, round, and reactive to light. EOM are normal.  Neck: Normal range of  motion. Neck supple. No tracheal deviation present.  Cardiovascular: Normal rate, regular rhythm, normal heart sounds and intact distal pulses. Exam reveals no gallop and no friction rub.  No murmur heard. Pulmonary/Chest: Effort normal and breath sounds normal. No respiratory distress. He has no wheezes. He has no rales.  Lymphadenopathy:       Head (right side): Submandibular and tonsillar adenopathy present.  Neurological: He is alert and oriented to person, place, and time.  Skin: Skin is warm and dry. No rash noted. No erythema.  Psychiatric: Mood, affect and judgment normal.  Nursing note and vitals reviewed.    Urgent Care Treatments / Results:   LABS: PLEASE NOTE: all labs that were ordered this encounter are listed, however only abnormal results are displayed. Labs Reviewed  RAPID STREP SCREEN ([facility] ONLY)  CULTURE, GROUP A STREP [facility])   RADIOLOGY: No results found.  PRODEDURES: Procedures  MEDICATIONS RECEIVED THIS VISIT: Medications - No data to display     Initial Impression / Assessment and Plan / Urgent Care Course:   Pertinent labs & imaging results that were available during my care of the patient were personally reviewed by me and considered in my medical decision making (see chart for details).   DAMANY EASTMAN is a 53 y.o. male who presents to [facility] today with complaints of white coating to his tongue and RIGHT neck pain. Patient currently being treated for strep pharyngitis with a 7 day course of Augmentin. Previously reported sore throat has improved. No fevers,  dysphagia, or SOB.  Strep swab (-) in clinic. Exam reveals some tender tonsillar/sub-mandibular adenopathy on the RIGHT. Tongue is white coated and gums are tender. Symptoms consistent with oral candidiasis. Will treat with magic mouthwash with lidocaine QID x 1 week, then PRN. Patient to completed prescribed Augmentin. May use Tylenol and/or Ibuprofen as needed for pain/fever.   Discussed follow up with primary care physician this week for re-evaluation. I have reviewed the follow up and strict return precautions for any new or worsening symptoms. Patient is aware of symptoms that would be deemed urgent/emergent, and would thus require further evaluation either here or in the emergency department. At the time of discharge, he verbalized understanding and consent with the discharge plan as it was reviewed with him. All questions were fielded by provider and/or clinic staff prior to patient discharge.    Final Clinical Impressions(s) / Urgent Care Diagnoses:   Final diagnoses:  Thrush    New Prescriptions:   Meds ordered this encounter  Medications  . magic mouthwash w/lidocaine SOLN    Sig: Take 10 mLs by mouth 4 (four) times daily.    Dispense:  240 mL    Refill:  0    Controlled Substance Prescriptions:  [place] Controlled Substance Registry consulted? Not Applicable  NOTE: This note was prepared using Dragon dictation software along with smaller phrase technology. Despite my best ability to proofread, there is the potential that transcriptional errors may still occur from this process, and are completely unintentional.     Karen Kitchens, NP 02/15/19 1245

## 2019-02-15 NOTE — Discharge Instructions (Signed)
It was very nice meeting you today in clinic. Thank you for entrusting me with your care.   Please utilize the medications that we discussed. Your prescription has been called in to your pharmacy.  Complete antibiotics.  Increase fluid intake.   Make arrangements to follow up with your regular doctor and/or the specialist for re-evaluation and further treatment as dicussed. If your symptoms/condition worsens, please seek follow up care either here or in the ER. Please remember, our [place] providers are "right here with you" when you need [place].   Again, it was my pleasure to take care of you today. Thank you for choosing our clinic. I hope that you start to feel better quickly.   Honor Loh, MSN, APRN, FNP-C, CEN Advanced Practice Provider [facility]

## 2019-02-18 LAB — CULTURE, GROUP A STREP (THRC)

## 2019-03-02 ENCOUNTER — Ambulatory Visit
Admission: EM | Admit: 2019-03-02 | Discharge: 2019-03-02 | Disposition: A | Discharge status: Home / Self Care | Payer: Self-pay | Attending: Family Medicine | Admitting: Family Medicine

## 2019-03-02 DIAGNOSIS — R42 Dizziness and giddiness: Secondary | ICD-10-CM

## 2019-03-02 MED ORDER — ONDANSETRON HCL 4 MG PO TABS: 4.0000 mg | ORAL_TABLET | Freq: Three times a day (TID) | ORAL | 0 refills | Status: DC | PRN

## 2019-03-02 MED ORDER — MECLIZINE HCL 25 MG PO TABS: 25.0000 mg | ORAL_TABLET | Freq: Three times a day (TID) | ORAL | 0 refills | Status: DC | PRN

## 2019-03-02 NOTE — ED Triage Notes (Signed)
Pt states he is having right ear pain after he finished the antibiotics, the pain has gotten better but now having vertigo and nausea.

## 2019-03-02 NOTE — Discharge Instructions (Signed)
Medication as prescribed.  If persists, recommend seeing ENT.  Take care  Dr. Lacinda Axon

## 2019-03-03 NOTE — ED Provider Notes (Signed)
[facility]    CSN: [id number] Arrival date & time: 03/02/19  1424  History   Chief Complaint Chief Complaint  Patient presents with  . Otalgia   HPI  53 year old male presents with recent ear pain.  Now has dizziness and nausea.  Patient reports that he has been symptomatic for the past 2 weeks.  He states that he initially had right ear pain.  This has now essentially resolved.  He recently completed a course of antibiotics for suspected strep throat.  This was done via an E visit.  Patient states that he is experiencing intermittent dizziness and associated nausea.  No vomiting.  Patient describes the dizziness as feeling lightheaded (such as when you get up from sitting/bending down too abruptly).  Denies presyncope.  He does feel off balance.  He reports tinnitus. He states that it occurs mostly after he gets off work in the evening.  No reports of chest pain or shortness of breath.  No issues with exertion.  No known relieving factors.  No other associated symptoms.  No other complaints.  PMH, Surgical Hx, Family Hx, Social History reviewed and updated as below.  Past Medical History:  Diagnosis Date  . Blindness of left eye   . GERD (gastroesophageal reflux disease)   . Hypertension   . hypothyroidism     Patient Active Problem List   Diagnosis Date Noted  . Cough 01/26/2019  . History of bacterial pneumonia 01/22/2018  . Statin intolerance 01/22/2018  . Skin neoplasm 01/22/2018  . Lipoma of back 01/22/2018  . 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 GERD without esophagitis 02/17/2017  . Olecranon bursitis 12/26/2016  . Tobacco abuse counseling 05/22/2016  . Essential hypertension 11/27/2015  . Chronic frontal sinusitis 04/03/2015  . Cervicalgia of occipito-atlanto-axial region 09/19/2014  . Anxiety state, unspecified 02/14/2014  . Hyperlipidemia LDL goal <100 02/14/2014  . Allergic rhinitis 11/15/2013  . Encounter for preventive health examination 02/14/2013   . Leg cramps, sleep related 02/12/2013  . Hypothyroidism (acquired) 10/08/2011    Past Surgical History:  Procedure Laterality Date  . 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 KNEE ARTHROSCOPY  2003   right , with residual numbness medial side of calf       Home Medications    Prior to Admission medications   Medication Sig Start Date End Date Taking? Authorizing Provider  SYNTHROID 137 MCG tablet TAKE ONE TABLET BY MOUTH EVERY DAY 01/02/19  Yes Crecencio Mc, MD  losartan (COZAAR) 50 MG tablet TAKE 1 TABLET BY MOUTH DAILY 01/02/19   Crecencio Mc, MD  magic mouthwash SOLN Take 5 mLs by mouth 3 (three) times daily as needed for mouth pain. 04/04/18   Coral Spikes, DO  magic mouthwash w/lidocaine SOLN Take 10 mLs by mouth 4 (four) times daily. 02/15/19   Karen Kitchens, NP  meclizine (ANTIVERT) 25 MG tablet Take 1 tablet (25 mg total) by mouth 3 (three) times daily as needed for dizziness. 03/02/19   Coral Spikes, DO  ondansetron (ZOFRAN) 4 MG tablet Take 1 tablet (4 mg total) by mouth every 8 (eight) hours as needed for nausea. 03/02/19   Coral Spikes, DO    Family History Family History  Problem Relation Age of Onset  . Stroke Mother 35       during pregnancy  . Aneurysm Mother   . COPD Mother   .  Heart disease Father 16       smoker  . Alcohol abuse Father   . Early death Sister        cirrhosis  . Diabetes Sister   . Dementia Maternal Aunt     Social History Social History   Tobacco Use  . Smoking status: Current Every Day Smoker    Packs/day: 0.50    Types: Cigarettes  . Smokeless tobacco: Never Used  . Tobacco comment: recently restarted smoking  Substance Use Topics  . Alcohol use: Yes    Alcohol/week: 12.0 standard drinks    Types: 12 Cans of beer per week  . Drug use: No     Allergies   Codeine   Review of Systems Review of Systems  HENT: Positive for ear pain.   Gastrointestinal: Positive for nausea.   Neurological: Positive for dizziness.   Physical Exam Triage Vital Signs ED Triage Vitals  Enc Vitals Group     BP 03/02/19 1449 123/87     Pulse Rate 03/02/19 1449 75     Resp 03/02/19 1449 18     Temp 03/02/19 1449 98.3 F (36.8 C)     Temp Source 03/02/19 1449 Oral     SpO2 03/02/19 1449 100 %     Weight 03/02/19 1451 200 lb (90.7 kg)     Height 03/02/19 1451 5\' 9"  (1.753 m)     Head Circumference --      Peak Flow --      Pain Score 03/02/19 1451 0     Pain Loc --      Pain Edu? --      Excl. in [place]? --    Updated Vital Signs BP 123/87 (BP Location: Right Arm)   Pulse 75   Temp 98.3 F (36.8 C) (Oral)   Resp 18   Ht 5\' 9"  (1.753 m)   Wt 90.7 kg   SpO2 100%   BMI 29.53 kg/m   Visual Acuity Right Eye Distance:   Left Eye Distance:   Bilateral Distance:    Right Eye Near:   Left Eye Near:    Bilateral Near:     Physical Exam Vitals signs and nursing note reviewed.  Constitutional:      General: He is not in acute distress.    Appearance: Normal appearance.  HENT:     Head: Normocephalic and atraumatic.     Right Ear: Tympanic membrane normal.     Left Ear: Tympanic membrane normal.     Nose: Nose normal.  Cardiovascular:     Rate and Rhythm: Normal rate and regular rhythm.  Pulmonary:     Effort: Pulmonary effort is normal.     Breath sounds: Normal breath sounds.  Neurological:     Mental Status: He is alert.     Comments: No apparent focal deficits.  Psychiatric:        Mood and Affect: Mood normal.        Behavior: Behavior normal.    UC Treatments / Results  Labs (all labs ordered are listed, but only abnormal results are displayed) Labs Reviewed - No data to display  EKG None  Radiology No results found.  Procedures Procedures (including critical care time)  Medications Ordered in UC Medications - No data to display  Initial Impression / Assessment and Plan / UC Course  I have reviewed the triage vital signs and the nursing  notes.  Pertinent labs & imaging results that were available during my  care of the patient were reviewed by me and considered in my medical decision making (see chart for details).    53 year old male presents with dizziness. DDX: Vertigo, Meniere's, Vestibular neuritis. No red flags.  Placing on meclizine and Zofran.  If fails improve or worsens, will need to see ENT for additional work-up.  Final Clinical Impressions(s) / UC Diagnoses   Final diagnoses:  Dizziness     Discharge Instructions     Medication as prescribed.  If persists, recommend seeing ENT.  Take care  Dr. Lacinda Axon    ED Prescriptions    Medication Sig Dispense Auth. Provider   ondansetron (ZOFRAN) 4 MG tablet Take 1 tablet (4 mg total) by mouth every 8 (eight) hours as needed for nausea. 12 tablet Ceci Taliaferro G, DO   meclizine (ANTIVERT) 25 MG tablet Take 1 tablet (25 mg total) by mouth 3 (three) times daily as needed for dizziness. 30 tablet Coral Spikes, DO     Controlled Substance Prescriptions [place] Controlled Substance Registry consulted? Not Applicable   Coral Spikes, [place] 03/03/19 8325

## 2019-03-20 ENCOUNTER — Telehealth: Payer: Self-pay | Admitting: Internal Medicine

## 2019-03-20 DIAGNOSIS — R7301 Impaired fasting glucose: Secondary | ICD-10-CM

## 2019-03-20 NOTE — Telephone Encounter (Signed)
Patients has had vision changes twice this year . He wants his A1C checked due to diabetes running in his family. CMET done 3.26.20 Glucose 99.

## 2019-03-21 NOTE — Telephone Encounter (Signed)
Pt has been scheduled for a lab appt

## 2019-03-21 NOTE — Telephone Encounter (Signed)
Does pt need to be scheduled for an office visit and lab or just lab work. His next appt is in March of 2021.

## 2019-03-21 NOTE — Telephone Encounter (Signed)
Lab ordered   No visit needed until results are known

## 2019-03-25 ENCOUNTER — Other Ambulatory Visit (INDEPENDENT_AMBULATORY_CARE_PROVIDER_SITE_OTHER): Payer: Self-pay

## 2019-03-25 ENCOUNTER — Other Ambulatory Visit: Payer: Self-pay

## 2019-03-25 DIAGNOSIS — R7301 Impaired fasting glucose: Secondary | ICD-10-CM

## 2019-03-25 LAB — HEMOGLOBIN A1C: Hgb A1c MFr Bld: 5.9 % (ref 4.6–6.5)

## 2019-04-04 ENCOUNTER — Other Ambulatory Visit: Payer: Self-pay | Admitting: Internal Medicine

## 2019-04-18 NOTE — Progress Notes (Signed)
Greater than 5 minutes, yet less than 10 minutes of time have been spent researching, coordinating, and implementing care for this patient today.  Thank you for the details you included in the comment boxes. Those details are very helpful in determining the best course of treatment for you and help us to provide the best care.  

## 2019-09-23 ENCOUNTER — Other Ambulatory Visit: Payer: Self-pay

## 2019-09-23 ENCOUNTER — Ambulatory Visit
Admission: EM | Admit: 2019-09-23 | Discharge: 2019-09-23 | Disposition: A | Discharge status: Home / Self Care | Payer: Self-pay | Attending: Family Medicine | Admitting: Family Medicine

## 2019-09-23 ENCOUNTER — Ambulatory Visit (INDEPENDENT_AMBULATORY_CARE_PROVIDER_SITE_OTHER): Payer: Self-pay

## 2019-09-23 DIAGNOSIS — R590 Localized enlarged lymph nodes: Secondary | ICD-10-CM

## 2019-09-23 DIAGNOSIS — R9389 Abnormal findings on diagnostic imaging of other specified body structures: Secondary | ICD-10-CM

## 2019-09-23 LAB — COMPREHENSIVE METABOLIC PANEL
ALT: 27 U/L (ref 0–44)
AST: 23 U/L (ref 15–41)
Albumin: 4.6 g/dL (ref 3.5–5.0)
Alkaline Phosphatase: 59 U/L (ref 38–126)
Anion gap: 9 (ref 5–15)
BUN: 18 mg/dL (ref 6–20)
CO2: 25 mmol/L (ref 22–32)
Calcium: 9.3 mg/dL (ref 8.9–10.3)
Chloride: 102 mmol/L (ref 98–111)
Creatinine, Ser: 1.22 mg/dL (ref 0.61–1.24)
GFR calc Af Amer: >60 mL/min (ref >60)
GFR calc non Af Amer: >60 mL/min (ref >60)
Glucose, Bld: 96 mg/dL (ref 70–99)
Potassium: 4.5 mmol/L (ref 3.5–5.1)
Sodium: 136 mmol/L (ref 135–145)
Total Bilirubin: 0.3 mg/dL (ref 0.3–1.2)
Total Protein: 7.5 g/dL (ref 6.5–8.1)

## 2019-09-23 LAB — CBC WITH DIFFERENTIAL/PLATELET
Abs Immature Granulocytes: 0.08 10*3/uL — ABNORMAL HIGH (ref 0.00–0.07)
Basophils Absolute: 0.1 10*3/uL (ref 0.0–0.1)
Basophils Relative: 1 %
Eosinophils Absolute: 0.2 10*3/uL (ref 0.0–0.5)
Eosinophils Relative: 2 %
HCT: 42.3 % (ref 39.0–52.0)
Hemoglobin: 14.6 g/dL (ref 13.0–17.0)
Immature Granulocytes: 1 %
Lymphocytes Relative: 19 %
Lymphs Abs: 2.1 10*3/uL (ref 0.7–4.0)
MCH: 34 pg (ref 26.0–34.0)
MCHC: 34.5 g/dL (ref 30.0–36.0)
MCV: 98.6 fL (ref 80.0–100.0)
Monocytes Absolute: 1.1 10*3/uL — ABNORMAL HIGH (ref 0.1–1.0)
Monocytes Relative: 10 %
Neutro Abs: 7.6 10*3/uL (ref 1.7–7.7)
Neutrophils Relative %: 67 %
Platelets: 201 10*3/uL (ref 150–400)
RBC: 4.29 MIL/uL (ref 4.22–5.81)
RDW: 12.6 % (ref 11.5–15.5)
WBC: 11.1 10*3/uL — ABNORMAL HIGH (ref 4.0–10.5)
nRBC: 0 % (ref 0.0–0.2)

## 2019-09-23 LAB — SARS CORONAVIRUS 2 AG (30 MIN TAT): SARS Coronavirus 2 Ag: NEGATIVE

## 2019-09-23 MED ORDER — DOXYCYCLINE HYCLATE 100 MG PO CAPS: 100.0000 mg | ORAL_CAPSULE | Freq: Two times a day (BID) | ORAL | 0 refills | Status: DC

## 2019-09-23 NOTE — ED Provider Notes (Signed)
[facility]    CSN: [id number] Arrival date & time: 09/23/19  1805    History   Chief Complaint Chief Complaint  Patient presents with   Arm Pain    left and right   HPI  53 year old male presents with the above complaint.  Patient reports that he developed a "knot" near his left elbow yesterday. Palpable and tender to the touch. No has another "knot" at the same location at the right elbow. Recent intermittent headache. No fever. No chills. No cough or respiratory symptoms. Pain rated as 4/10 in severity. He states that the area at his left elbow is improved. No other associated symptoms.  PMH, Surgical Hx, Family Hx, Social History reviewed and updated as below.  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   GERD without esophagitis 02/17/2017   Olecranon bursitis 12/26/2016   Tobacco abuse counseling 05/22/2016   Essential hypertension 11/27/2015   Chronic frontal sinusitis 04/03/2015   Cervicalgia of occipito-atlanto-axial region 09/19/2014   Anxiety state, unspecified 02/14/2014   Hyperlipidemia LDL goal <100 02/14/2014   Allergic rhinitis 11/15/2013   Encounter for preventive health examination 02/14/2013   Leg cramps, sleep related 02/12/2013   Hypothyroidism (acquired) 10/08/2011    Past Surgical History:  Procedure Laterality Date   EYE SURGERY  1987   left eye traua, prosthetic eye ,  still has metal in eyelid   KNEE ARTHROSCOPY  2003   right , with residual numbness medial side of calf    Home Medications    Prior to Admission medications   Medication Sig Start Date End Date Taking? Authorizing Provider  esomeprazole (NEXIUM) 20 MG packet Take 20 mg by mouth daily before breakfast.   Yes [provider]    loratadine (CLARITIN) 10 MG tablet Take 10 mg by mouth daily.   Yes [provider]  losartan (COZAAR) 50 MG tablet TAKE 1 TABLET BY MOUTH DAILY 01/02/19  Yes Crecencio Mc, MD  SYNTHROID 137 MCG tablet TAKE 1 TABLET EVERY DAY ON EMPTY STOMACHWITH A GLASS OF WATER AT LEAST 30-60 MINBEFORE BREAKFAST 04/04/19  Yes Crecencio Mc, MD  doxycycline (VIBRAMYCIN) 100 MG capsule Take 1 capsule (100 mg total) by mouth 2 (two) times daily. 09/23/19   Coral Spikes, DO  magic mouthwash SOLN Take 5 mLs by mouth 3 (three) times daily as needed for mouth pain. 04/04/18 09/23/19  Coral Spikes, DO    Family History Family History  Problem Relation Age of Onset   Stroke Mother 2       during pregnancy   Aneurysm Mother    COPD Mother    Heart disease Father 45       smoker   Alcohol abuse Father    Early death Sister        cirrhosis   Diabetes Sister    Dementia Maternal Aunt     Social History Social History   Tobacco Use   Smoking status: Current Every Day Smoker    Packs/day: 0.50    Types: Cigarettes   Smokeless tobacco: Never Used   Tobacco comment: recently restarted smoking  Substance Use Topics   Alcohol use: Yes    Alcohol/week: 12.0 standard drinks    Types: 12 Cans of beer per  week   Drug use: No     Allergies   Codeine   Review of Systems Review of Systems  Constitutional: Negative.   Respiratory: Negative.   Musculoskeletal:       "Knots" - elbows.   Physical Exam Triage Vital Signs ED Triage Vitals  Enc Vitals Group     BP 09/23/19 1838 (!) 142/96     Pulse Rate 09/23/19 1838 71     Resp 09/23/19 1838 16     Temp 09/23/19 1838 98.4 F (36.9 C)     Temp Source 09/23/19 1838 Oral     SpO2 09/23/19 1838 98 %     Weight 09/23/19 1837 200 lb (90.7 kg)     Height 09/23/19 1837 5' 9.5" (1.765 m)     Head Circumference --      Peak Flow --      Pain Score 09/23/19 1836 4     Pain Loc --      Pain Edu? --      Excl. in [place]? --     Updated Vital Signs BP (!) 142/96 (BP Location: Left Arm)    Pulse 71    Temp 98.4 F (36.9 C) (Oral)    Resp 16    Ht 5' 9.5" (1.765 m)    Wt 90.7 kg    SpO2 98%    BMI 29.11 kg/m   Visual Acuity Right Eye Distance:   Left Eye Distance:   Bilateral Distance:    Right Eye Near:   Left Eye Near:    Bilateral Near:     Physical Exam Vitals signs and nursing note reviewed.  Constitutional:      General: He is not in acute distress.    Appearance: Normal appearance. He is not ill-appearing.  HENT:     Head: Normocephalic and atraumatic.  Eyes:     General:        Right eye: No discharge.        Left eye: No discharge.     Conjunctiva/sclera: Conjunctivae normal.  Cardiovascular:     Rate and Rhythm: Normal rate and regular rhythm.     Heart sounds: No murmur.  Pulmonary:     Effort: Pulmonary effort is normal.     Breath sounds: Normal breath sounds. No wheezing, rhonchi or rales.  Lymphadenopathy:     Upper Body:     Right upper body: Epitrochlear adenopathy present.     Left upper body: Epitrochlear adenopathy present.     Comments: Palpable epitrochlear lymph node palpable and tender to palpation on both sides.  Skin:    Comments: Hands with cracks/dryness. No significant erythema.  Neurological:     Mental Status: He is alert.  Psychiatric:        Mood and Affect: Mood normal.        Behavior: Behavior normal.    UC Treatments / Results  Labs (all labs ordered are listed, but only abnormal results are displayed) Labs Reviewed  CBC WITH DIFFERENTIAL/PLATELET - Abnormal; Notable for the following components:      Result Value   WBC 11.1 (*)    Monocytes Absolute 1.1 (*)    Abs Immature Granulocytes 0.08 (*)    All other components within normal limits  SARS CORONAVIRUS 2 AG (30 MIN TAT)  COMPREHENSIVE METABOLIC PANEL  SAVE SMEAR (SSMR)  PATHOLOGIST SMEAR REVIEW    EKG   Radiology Dg Chest 2 View  Result Date: 09/23/2019 CLINICAL DATA:  Palpable  abnormalities near the elbows concerning for sarcoidosis. EXAM: CHEST - 2 VIEW COMPARISON:  01/23/2019 FINDINGS: Cardiac shadows within normal limits. Aorta is unremarkable. The lungs are clear bilaterally. No focal infiltrate or sizable effusion is seen. No findings to suggest lymphadenopathy are noted. No bony abnormality is seen. IMPRESSION: No acute abnormality noted. Electronically Signed   By: Inez Catalina M.D.   On: 09/23/2019 19:34    Procedures Procedures (including critical care time)  Medications Ordered in UC Medications - No data to display  Initial Impression / Assessment and Plan / UC Course  I have reviewed the triage vital signs and the nursing notes.  Pertinent labs & imaging results that were available during my care of the patient were reviewed by me and considered in my medical decision making (see chart for details).    53 year old male presents with epitrochlear lymphadenopathy. DDx: Skin infection, malignancy, tularemia, sarcoidosis. Chest xray negative.  Labs notable for mildly elevated WBC count.  No risk factors or exposures to suggest tularemia. Placing on empiric doxy. Sending smear. Will discuss with patient's PCP and consider referral to Heme/Onc.  Final Clinical Impressions(s) / UC Diagnoses   Final diagnoses:  Epitrochlear lymphadenopathy     Discharge Instructions     Your labs and chest xray are reassuring. I am sending off a smear to the pathologist.  Take the antibiotic as prescribed to cover for skin infection and tularemia (although tularemia is unlikely).  I will send a message to Dr. Derrel Nip and I will check on you in the next few days.  Take care  Dr. Lacinda Axon    ED Prescriptions    Medication Sig Dispense Auth. Provider   doxycycline (VIBRAMYCIN) 100 MG capsule Take 1 capsule (100 mg total) by mouth 2 (two) times daily. 20 capsule Coral Spikes, DO     PDMP not reviewed this encounter.   Coral Spikes, [place] 09/23/19 2338

## 2019-09-23 NOTE — Discharge Instructions (Signed)
Your labs and chest xray are reassuring. I am sending off a smear to the pathologist.  Take the antibiotic as prescribed to cover for skin infection and tularemia (although tularemia is unlikely).  I will send a message to Dr. Derrel Nip and I will check on you in the next few days.  Take care  Dr. Lacinda Axon

## 2019-09-23 NOTE — ED Triage Notes (Signed)
Patient states that he has 2 knots one on each arm that started yesterday. States that knot is near [facility] and pain radiates up his arm. Patient states that he has been having a headache x 3-4 days.

## 2019-09-24 ENCOUNTER — Telehealth: Payer: Self-pay | Admitting: Internal Medicine

## 2019-09-24 LAB — PATHOLOGIST SMEAR REVIEW

## 2019-09-24 NOTE — Telephone Encounter (Signed)
Does this need to be in office or is virtual okay?

## 2019-09-24 NOTE — Telephone Encounter (Signed)
Tim Owen needs a three week follow up from his visit with Dr Lacinda Axon to see if his lymph nodes have calmed down

## 2019-09-24 NOTE — Telephone Encounter (Signed)
HAS TO HAVE A PHYSICAL EXAM ,  OK TO USE A 4:30

## 2019-09-24 NOTE — Telephone Encounter (Signed)
Spoke with pt and he is scheduled for a 3 week follow up with Dr. Derrel Nip on 10/14/2019.

## 2019-10-09 ENCOUNTER — Other Ambulatory Visit: Payer: Self-pay

## 2019-10-09 ENCOUNTER — Other Ambulatory Visit: Payer: Self-pay | Admitting: Internal Medicine

## 2019-10-14 ENCOUNTER — Other Ambulatory Visit: Payer: Self-pay

## 2019-10-14 ENCOUNTER — Ambulatory Visit (INDEPENDENT_AMBULATORY_CARE_PROVIDER_SITE_OTHER): Payer: Self-pay | Admitting: Internal Medicine

## 2019-10-14 ENCOUNTER — Encounter: Payer: Self-pay | Admitting: Internal Medicine

## 2019-10-14 VITALS — BP 138/88 | HR 71 | Temp 97.1°F | Resp 15 | Ht 69.5 in | Wt 194.0 lb

## 2019-10-14 DIAGNOSIS — R590 Localized enlarged lymph nodes: Secondary | ICD-10-CM | POA: Insufficient documentation

## 2019-10-14 DIAGNOSIS — R7303 Prediabetes: Secondary | ICD-10-CM | POA: Insufficient documentation

## 2019-10-14 NOTE — Progress Notes (Signed)
Subjective:  Patient ID: Tim Owen, male    DOB: 1966-09-06  Age: 53 y.o. MRN: [id number]  CC: The primary encounter diagnosis was Prediabetes. A diagnosis of Epitrochlear lymphadenopathy was also pertinent to this visit.  This visit occurred during the SARS-CoV-2 public health emergency.  Safety protocols were in place, including screening questions prior to the visit, additional usage of staff PPE, and extensive cleaning of exam room while observing appropriate contact time as indicated for disinfecting solutions.   HPI Tim Owen presents for follow up on an urgent care visit several weeks ago for evaluation of epitrochlear LAD  noted during  evaluation by Dr Lacinda Axon on Nov 24 for  bilateral painful epithrochlear and axillary LAD accompanied by intermittent headache .  No fevers, night sweats, or cough.  No unintentional weight loss or rash.   Monogamous,  Only one partner his entire life.   Monocytosis was noted on PBS   He Skinned a deer two weeks prior to onset of symptoms,  And also during a job had exposure to rat excrement     Interestingly, he developed  a "boil" on his left medial thigh  That appeared the day after his evaluation by Dr Lacinda Axon.  The boil  Became golf ball sized but never spontaneously drained. It resolved with a central small ulcer.   Dr Lacinda Axon had put  Him on the doxycycline for ten days for the epitrochlear LAD .   Outpatient Medications Prior to Visit  Medication Sig Dispense Refill  . esomeprazole (NEXIUM) 20 MG packet Take 20 mg by mouth daily before breakfast.    . loratadine (CLARITIN) 10 MG tablet Take 10 mg by mouth daily.    [name] losartan (COZAAR) 50 MG tablet TAKE ONE TABLET EVERY DAY 90 tablet 1  . SYNTHROID 137 MCG tablet TAKE 1 TABLET EVERY DAY ON EMPTY STOMACHWITH A GLASS OF WATER AT LEAST 30-60 MINBEFORE BREAKFAST 90 tablet 0  . doxycycline (VIBRAMYCIN) 100 MG capsule Take 1 capsule (100 mg total) by mouth 2 (two) times daily. 20 capsule 0    Facility-Administered Medications Prior to Visit  Medication Dose Route Frequency Provider Last Rate Last Admin  . lidocaine (XYLOCAINE) 2 % (with pres) injection 30 mg  1.5 mL Other Once Tim Mc, MD      . triamcinolone acetonide (KENALOG-40) injection 20 mg  20 mg Intra-articular Once Tim Mc, MD        Review of Systems;  Patient denies headache, fevers, malaise, unintentional weight loss, skin rash, eye pain, sinus congestion and sinus pain, sore throat, dysphagia,  hemoptysis , cough, dyspnea, wheezing, chest pain, palpitations, orthopnea, edema, abdominal pain, nausea, melena, diarrhea, constipation, flank pain, dysuria, hematuria, urinary  Frequency, nocturia, numbness, tingling, seizures,  Focal weakness, Loss of consciousness,  Tremor, insomnia, depression, anxiety, and suicidal ideation.      Objective:  BP 138/88 (BP Location: Left Arm, Patient Position: Sitting, Cuff Size: Normal)   Pulse 71   Temp (!) 97.1 F (36.2 C) (Temporal)   Resp 15   Ht 5' 9.5" (1.765 m)   Wt 194 lb (88 kg)   SpO2 99%   BMI 28.24 kg/m   BP Readings from Last 3 Encounters:  10/14/19 138/88  09/23/19 (!) 142/96  03/02/19 123/87    Wt Readings from Last 3 Encounters:  10/14/19 194 lb (88 kg)  09/23/19 200 lb (90.7 kg)  03/02/19 200 lb (90.7 kg)    General appearance: alert, cooperative and  appears stated age Ears: normal TM's and external ear canals both ears Throat: lips, mucosa, and tongue normal; teeth and gums normal Neck: no adenopathy, no carotid bruit, supple, symmetrical, trachea midline and thyroid not enlarged, symmetric, no tenderness/mass/nodules Back: symmetric, no curvature. ROM normal. No CVA tenderness. Lungs: clear to auscultation bilaterally Heart: regular rate and rhythm, S1, S2 normal, no murmur, click, rub or gallop Abdomen: soft, non-tender; bowel sounds normal; no masses,  no organomegaly Ext:  No axillary or epitrochlear LAD  Groin:  No inguinal  LAD  Pulses: 2+ and symmetric Skin: Skin color, texture, turgor normal. No rashes or lesions Lymph nodes: Cervical, supraclavicular, and axillary nodes normal.  Lab Results  Component Value Date   HGBA1C 5.9 03/25/2019   HGBA1C 5.7 08/20/2012    Lab Results  Component Value Date   CREATININE 1.22 09/23/2019   CREATININE 1.23 01/23/2019   CREATININE 1.19 01/21/2018    Lab Results  Component Value Date   WBC 11.1 (H) 09/23/2019   HGB 14.6 09/23/2019   HCT 42.3 09/23/2019   PLT 201 09/23/2019   GLUCOSE 96 09/23/2019   CHOL 190 01/23/2019   TRIG 116.0 01/23/2019   HDL 35.30 (L) 01/23/2019   LDLDIRECT 145.0 12/03/2015   LDLCALC 131 (H) 01/23/2019   ALT 27 09/23/2019   AST 23 09/23/2019   NA 136 09/23/2019   K 4.5 09/23/2019   CL 102 09/23/2019   CREATININE 1.22 09/23/2019   BUN 18 09/23/2019   CO2 25 09/23/2019   TSH 2.94 01/23/2019   PSA 0.35 01/23/2019   HGBA1C 5.9 03/25/2019   MICROALBUR 2.5 (H) 02/09/2017    DG Chest 2 View  Result Date: 09/23/2019 CLINICAL DATA:  Palpable abnormalities near the elbows concerning for sarcoidosis. EXAM: CHEST - 2 VIEW COMPARISON:  01/23/2019 FINDINGS: Cardiac shadows within normal limits. Aorta is unremarkable. The lungs are clear bilaterally. No focal infiltrate or sizable effusion is seen. No findings to suggest lymphadenopathy are noted. No bony abnormality is seen. IMPRESSION: No acute abnormality noted. Electronically Signed   By: Inez Catalina M.D.   On: 09/23/2019 19:34    Assessment & Plan:   Problem List Items Addressed This Visit      Unprioritized   Epitrochlear lymphadenopathy    The differential diagnosis includes infections of the forearm or hand, lymphoma, sarcoidosis, tularemia, and secondary syphilis. His history supports tularemia. He had a mild monocytosis on PBS several weeks ago and will have a repeat PBS along with HIV, RPR and francisella antibody       Relevant Orders   CBC with Differential/Platelet     HIV antibody   RPR   Pathologist smear review   DG Chest 2 View   Francisella Tularensis Antibody, DA   Prediabetes - Primary    His random glucose was not  elevated but is A1c suggested he is at risk for developing diabetes.  I recommend he follow a low glycemic index diet and particpate regularly in an aerobic  exercise activity.        Relevant Orders   Hemoglobin A1c      I have discontinued Bear D. Samano's doxycycline. I am also having him maintain his esomeprazole, loratadine, losartan, and Synthroid. We will continue to administer lidocaine and triamcinolone acetonide.  No orders of the defined types were placed in this encounter.   Medications Discontinued During This Encounter  Medication Reason  . doxycycline (VIBRAMYCIN) 100 MG capsule Completed Course    Follow-up: No follow-ups on  file.   Tim Mc, MD

## 2019-10-14 NOTE — Assessment & Plan Note (Signed)
His random glucose was not  elevated but is A1c suggested he is at risk for developing diabetes.  I recommend he follow a low glycemic index diet and particpate regularly in an aerobic  exercise activity.

## 2019-10-14 NOTE — Patient Instructions (Signed)
Please go to [facility] at your earliest convenience to have the following tests done:  Labs  Chest x ray   You have no signs of ongoing enlarged lymph nodes,  So the most likely cause was an infection ca;ed tularemia

## 2019-10-14 NOTE — Assessment & Plan Note (Addendum)
The differential diagnosis includes infections of the forearm or hand, lymphoma, sarcoidosis, tularemia, and secondary syphilis. His history supports tularemia. He had a mild monocytosis on PBS several weeks ago and will have a repeat PBS along with HIV, RPR and francisella antibody

## 2020-01-09 ENCOUNTER — Other Ambulatory Visit: Payer: Self-pay | Admitting: Internal Medicine

## 2020-01-22 ENCOUNTER — Other Ambulatory Visit: Payer: Self-pay

## 2020-01-26 ENCOUNTER — Other Ambulatory Visit: Payer: Self-pay

## 2020-01-26 ENCOUNTER — Encounter: Payer: Self-pay | Admitting: Internal Medicine

## 2020-01-26 ENCOUNTER — Ambulatory Visit (INDEPENDENT_AMBULATORY_CARE_PROVIDER_SITE_OTHER): Payer: Self-pay

## 2020-01-26 ENCOUNTER — Ambulatory Visit (INDEPENDENT_AMBULATORY_CARE_PROVIDER_SITE_OTHER): Payer: Self-pay | Admitting: Internal Medicine

## 2020-01-26 VITALS — BP 110/76 | HR 70 | Temp 96.7°F | Ht 69.5 in | Wt 194.6 lb

## 2020-01-26 DIAGNOSIS — E782 Mixed hyperlipidemia: Secondary | ICD-10-CM

## 2020-01-26 DIAGNOSIS — D72821 Monocytosis (symptomatic): Secondary | ICD-10-CM | POA: Insufficient documentation

## 2020-01-26 DIAGNOSIS — E039 Hypothyroidism, unspecified: Secondary | ICD-10-CM

## 2020-01-26 DIAGNOSIS — Z125 Encounter for screening for malignant neoplasm of prostate: Secondary | ICD-10-CM

## 2020-01-26 DIAGNOSIS — R590 Localized enlarged lymph nodes: Secondary | ICD-10-CM

## 2020-01-26 DIAGNOSIS — R05 Cough: Secondary | ICD-10-CM

## 2020-01-26 DIAGNOSIS — Z0000 Encounter for general adult medical examination without abnormal findings: Secondary | ICD-10-CM

## 2020-01-26 DIAGNOSIS — E785 Hyperlipidemia, unspecified: Secondary | ICD-10-CM

## 2020-01-26 DIAGNOSIS — G4762 Sleep related leg cramps: Secondary | ICD-10-CM

## 2020-01-26 DIAGNOSIS — Z716 Tobacco abuse counseling: Secondary | ICD-10-CM

## 2020-01-26 DIAGNOSIS — R059 Cough, unspecified: Secondary | ICD-10-CM

## 2020-01-26 DIAGNOSIS — R7303 Prediabetes: Secondary | ICD-10-CM

## 2020-01-26 DIAGNOSIS — Z1211 Encounter for screening for malignant neoplasm of colon: Secondary | ICD-10-CM

## 2020-01-26 LAB — LIPID PANEL
Cholesterol: 176 mg/dL (ref 0–200)
HDL: 41.8 mg/dL (ref >39.00)
LDL Cholesterol: 116 mg/dL — ABNORMAL HIGH (ref 0–99)
NonHDL: 134.05
Total CHOL/HDL Ratio: 4
Triglycerides: 91 mg/dL (ref 0.0–149.0)
VLDL: 18.2 mg/dL (ref 0.0–40.0)

## 2020-01-26 LAB — CBC WITH DIFFERENTIAL/PLATELET
Basophils Absolute: 0.1 10*3/uL (ref 0.0–0.1)
Basophils Relative: 1 % (ref 0.0–3.0)
Eosinophils Absolute: 0.2 10*3/uL (ref 0.0–0.7)
Eosinophils Relative: 2.7 % (ref 0.0–5.0)
HCT: 45 % (ref 39.0–52.0)
Hemoglobin: 15.6 g/dL (ref 13.0–17.0)
Lymphocytes Relative: 26.3 % (ref 12.0–46.0)
Lymphs Abs: 1.6 10*3/uL (ref 0.7–4.0)
MCHC: 34.6 g/dL (ref 30.0–36.0)
MCV: 99.4 fl (ref 78.0–100.0)
Monocytes Absolute: 0.6 10*3/uL (ref 0.1–1.0)
Monocytes Relative: 9 % (ref 3.0–12.0)
Neutro Abs: 3.8 10*3/uL (ref 1.4–7.7)
Neutrophils Relative %: 61 % (ref 43.0–77.0)
Platelets: 235 10*3/uL (ref 150.0–400.0)
RBC: 4.53 Mil/uL (ref 4.22–5.81)
RDW: 13.3 % (ref 11.5–15.5)
WBC: 6.2 10*3/uL (ref 4.0–10.5)

## 2020-01-26 LAB — COMPREHENSIVE METABOLIC PANEL
ALT: 23 U/L (ref 0–53)
AST: 18 U/L (ref 0–37)
Albumin: 4.4 g/dL (ref 3.5–5.2)
Alkaline Phosphatase: 49 U/L (ref 39–117)
BUN: 16 mg/dL (ref 6–23)
CO2: 29 mEq/L (ref 19–32)
Calcium: 9.5 mg/dL (ref 8.4–10.5)
Chloride: 103 mEq/L (ref 96–112)
Creatinine, Ser: 1.25 mg/dL (ref 0.40–1.50)
GFR: 60.01 mL/min (ref >60.00)
Glucose, Bld: 101 mg/dL — ABNORMAL HIGH (ref 70–99)
Potassium: 4.7 mEq/L (ref 3.5–5.1)
Sodium: 138 mEq/L (ref 135–145)
Total Bilirubin: 0.7 mg/dL (ref 0.2–1.2)
Total Protein: 6.3 g/dL (ref 6.0–8.3)

## 2020-01-26 LAB — PSA: PSA: 0.37 ng/mL (ref 0.10–4.00)

## 2020-01-26 LAB — TSH: TSH: 3.18 u[IU]/mL (ref 0.35–4.50)

## 2020-01-26 LAB — HEMOGLOBIN A1C: Hgb A1c MFr Bld: 5.7 % (ref 4.6–6.5)

## 2020-01-26 NOTE — Assessment & Plan Note (Addendum)
10 yr risk is 19%..  Lipids are untreated currently due to trial of pravastatin causing severe myalgias. He is unwilling to consider alternative therapy   Lab Results  Component Value Date   CHOL 176 01/26/2020   HDL 41.80 01/26/2020   LDLCALC 116 (H) 01/26/2020   LDLDIRECT 145.0 12/03/2015   TRIG 91.0 01/26/2020   CHOLHDL 4 01/26/2020

## 2020-01-26 NOTE — Assessment & Plan Note (Signed)
The differential diagnosis includes infections of the forearm or hand, lymphoma, sarcoidosis, tularemia.  He has been monogamous with his wife since their marriage over 25 years ago, so unlikely to be due to  secondary syphilis. His history supports tularemia. He had a mild monocytosis on PBS several weeks ago and will have a repeat PBS along with francisella antibody

## 2020-01-26 NOTE — Assessment & Plan Note (Signed)
His random glucose was not  elevated but is A1c suggested he is at risk for developing diabetes.  I recommend he follow a low glycemic index diet and particpate regularly in an aerobic  exercise activity.   Lab Results  Component Value Date   HGBA1C 5.7 01/26/2020

## 2020-01-26 NOTE — Assessment & Plan Note (Signed)
Noted during prior infectious workup.  Repeat ordered but not done. Repeating now.  Lab Results  Component Value Date   WBC 6.2 01/26/2020   HGB 15.6 01/26/2020   HCT 45.0 01/26/2020   MCV 99.4 01/26/2020   PLT 235.0 01/26/2020

## 2020-01-26 NOTE — Patient Instructions (Signed)

## 2020-01-26 NOTE — Assessment & Plan Note (Signed)
TSH is therapeutic,  Continue current levothyoxine dose,  Recheck in 6 montths   Lab Results  Component Value Date   TSH 3.18 01/26/2020

## 2020-01-26 NOTE — Assessment & Plan Note (Signed)
Advised to try drinking an ounce of pickle juice daily in the evening

## 2020-01-26 NOTE — Assessment & Plan Note (Signed)

## 2020-01-26 NOTE — Progress Notes (Signed)
Patient ID: Tim Owen, male    DOB: [birth year]  Age: 54 y.o. MRN: [id number]  The patient is here for annual preventive  examination and management of other chronic and acute problems.   This visit occurred during the SARS-CoV-2 public health emergency.  Safety protocols were in place, including screening questions prior to the visit, additional usage of staff PPE, and extensive cleaning of exam room while observing appropriate contact time as indicated for disinfecting solutions.   The risk factors are reflected in the social history.  The roster of all physicians providing medical care to patient - is listed in the Snapshot section of the chart.  Activities of daily living:  The patient is 100% independent in all ADLs: dressing, toileting, feeding as well as independent mobility  Home safety : The patient has smoke detectors in the home. They wear seatbelts.  There are no firearms at home. There is no violence in the home.   There is no risks for hepatitis, STDs or HIV. There is no   history of blood transfusion. They have no travel history to infectious disease endemic areas of the world.  The patient has seen their dentist in the last six month. They have seen their eye doctor in the last year. They deny hearing difficulty with regard to whispered voices and some television programs.  They have deferred audiologic testing in the last year.  They do not  have excessive sun exposure. Discussed the need for sun protection: hats, long sleeves and use of sunscreen if there is significant sun exposure.   Diet: the importance of a healthy diet is discussed. They do have a healthy diet.  The benefits of regular aerobic exercise were discussed. He averages 4 miles of walking daily .  Depression screen: there are no signs or vegative symptoms of depression- irritability, change in appetite, anhedonia, sadness/tearfullness.  The following portions of the patient's history were reviewed and  updated as appropriate: allergies, current medications, past family history, past medical history,  past surgical history, past social history  and problem list.  Visual acuity was not assessed per patient preference since he has  regular follow up with his ophthalmologist. Hearing and body mass index were assessed and reviewed.   During the course of the visit the patient was educated and counseled about appropriate screening and preventive services including : fall prevention , diabetes screening, nutrition counseling, colorectal cancer screening, and recommended immunizations.    CC: The primary encounter diagnosis was Hypothyroidism (acquired). Diagnoses of Prostate cancer screening, Epitrochlear adenopathy, Moderate mixed hyperlipidemia not requiring statin therapy, Monocytosis, Prediabetes, Colon cancer screening, Encounter for preventive health examination, Leg cramps, sleep related, Hyperlipidemia LDL goal <100, Tobacco abuse counseling, Epitrochlear lymphadenopathy, and Cough were also pertinent to this visit.  Tobacco abuse:  Up to a pack daily.  Wife smokes too. Reviewed prior attempts to quit . Once for 4 years   Once for 2.5 years  Lung CA screening:  Has early morning cough. Doesn't want to know.  Decision based on experience of a friend's wife who died of COVID 35 infection two months after starting treatment for colon CA  Epitrochlear LAD: now resolved.  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  Has cataract left eye which now requires surgery,  Planned  by [place]    History Decameron has a past medical history of Blindness of left eye, GERD (gastroesophageal reflux disease), Hypertension, and hypothyroidism.   He has a past surgical history that includes Eye surgery (1987) and Knee arthroscopy (2003).    His family history includes Alcohol abuse in his father; Aneurysm in his mother; COPD in his mother; Dementia in his maternal aunt; Diabetes in his sister; Early death in his sister; Heart disease (age of onset: 43) in his father; Stroke (age of onset: 68) in his mother.He reports that he has been smoking cigarettes. He has been smoking about 0.50 packs per day. He has never used smokeless tobacco. He reports current alcohol use of about 12.0 standard drinks of alcohol per week. He reports that he does not use drugs.  Outpatient Medications Prior to Visit  Medication Sig Dispense Refill  . esomeprazole (NEXIUM) 20 MG packet Take 20 mg by mouth daily before breakfast.    . loratadine (CLARITIN) 10 MG tablet Take 10 mg by mouth daily.    [name] losartan (COZAAR) 50 MG tablet TAKE ONE TABLET EVERY DAY 90 tablet 1  . SYNTHROID 137 MCG tablet TAKE 1 TABLET EVERY DAY ON EMPTY STOMACHWITH A GLASS OF WATER AT LEAST 30-60 MINBEFORE BREAKFAST 90 tablet 0   Facility-Administered Medications Prior to Visit  Medication Dose Route Frequency Provider Last Rate Last Admin  . lidocaine (XYLOCAINE) 2 % (with pres) injection 30 mg  1.5 mL Other Once Crecencio Mc, MD      . triamcinolone acetonide (KENALOG-40) injection 20 mg  20 mg Intra-articular Once Crecencio Mc, MD        Review of Systems   Patient denies headache, fevers, malaise, unintentional weight loss, skin rash, eye pain, sinus congestion and sinus pain, sore throat, dysphagia,  hemoptysis , cough, dyspnea, wheezing, chest pain, palpitations, orthopnea, edema, abdominal pain, nausea, melena, diarrhea, constipation, flank pain, dysuria, hematuria, urinary  Frequency, nocturia, numbness, tingling, seizures,  Focal weakness, Loss of consciousness,  Tremor, insomnia, depression, anxiety, and suicidal ideation.      Objective:  BP 110/76   Pulse 70   Temp (!) 96.7 F (35.9 C) (Temporal)   Ht 5' 9.5" (1.765 m)   Wt 194 lb 9.6 oz (88.3 kg)   SpO2  99%   BMI 28.33 kg/m   Physical Exam  General appearance: alert, cooperative and appears stated age Ears: normal TM's and external ear canals both ears Throat: lips, mucosa, and tongue normal; teeth and gums normal Neck: no adenopathy, no carotid bruit, supple, symmetrical, trachea midline and thyroid not enlarged, symmetric, no tenderness/mass/nodules Back: symmetric, no curvature. ROM normal. No CVA tenderness. Lungs: clear to auscultation bilaterally Heart: regular rate and rhythm, S1, S2 normal, no murmur, click, rub or gallop Abdomen: soft, non-tender; bowel sounds normal; no masses,  no organomegaly Pulses: 2+ and symmetric Skin: Skin color, texture, turgor normal. No rashes or lesions Lymph nodes: Cervical, supraclavicular, epitrochlear and axillary nodes normal.  Assessment & Plan:   Problem List Items Addressed This Visit      Unprioritized   Hypothyroidism (acquired) - Primary    TSH is therapeutic,  Continue current levothyoxine dose,  Recheck in 6 montths   Lab Results  Component Value Date   TSH 3.18 01/26/2020         Relevant Orders   TSH (Completed)   Leg cramps, sleep related    Advised to try drinking an ounce of pickle juice daily in the evening  Encounter for preventive health examination    age appropriate education and counseling updated, referrals for preventative services and immunizations addressed, dietary and smoking counseling addressed, most recent labs reviewed.  I have personally reviewed and have noted:  1) the patient's medical and social history 2) The pt's use of alcohol, tobacco, and illicit drugs 3) The patient's current medications and supplements 4) Functional ability including ADL's, fall risk, home safety risk, hearing and visual impairment 5) Diet and physical activities 6) Evidence for depression or mood disorder 7) The patient's height, weight, and BMI have been recorded in the chart  I have made referrals, and  provided counseling and education based on review of the above      Hyperlipidemia LDL goal <100    10 yr risk is 19%..  Lipids are untreated currently due to trial of pravastatin causing severe myalgias. He is unwilling to consider alternative therapy   Lab Results  Component Value Date   CHOL 176 01/26/2020   HDL 41.80 01/26/2020   LDLCALC 116 (H) 01/26/2020   LDLDIRECT 145.0 12/03/2015   TRIG 91.0 01/26/2020   CHOLHDL 4 01/26/2020         Tobacco abuse counseling    Risks of continued tobacco use were discussed. he is not currently interested in tobacco cessation.       Cough    Chronic morning,  Given ongoing tobacco abuse , Chest x ray ordered      Epitrochlear lymphadenopathy    The differential diagnosis includes infections of the forearm or hand, lymphoma, sarcoidosis, tularemia.  He has been monogamous with his wife since their marriage over 25 years ago, so unlikely to be due to  secondary syphilis. His history supports tularemia. He had a mild monocytosis on PBS several weeks ago and will have a repeat PBS along with francisella antibody       Prediabetes    His random glucose was not  elevated but is A1c suggested he is at risk for developing diabetes.  I recommend he follow a low glycemic index diet and particpate regularly in an aerobic  exercise activity.   Lab Results  Component Value Date   HGBA1C 5.7 01/26/2020          Relevant Orders   Hemoglobin A1c (Completed)   Monocytosis    Noted during prior infectious workup.  Repeat ordered but not done. Repeating now.  Lab Results  Component Value Date   WBC 6.2 01/26/2020   HGB 15.6 01/26/2020   HCT 45.0 01/26/2020   MCV 99.4 01/26/2020   PLT 235.0 01/26/2020         Relevant Orders   Pathologist smear review   CBC with Differential/Platelet (Completed)    Other Visit Diagnoses    Prostate cancer screening       Relevant Orders   PSA (Completed)   Epitrochlear adenopathy       Relevant  Orders   DG Chest 2 View (Completed)   Francisella Tularensis Antibody, DA   Moderate mixed hyperlipidemia not requiring statin therapy       Relevant Orders   Lipid panel (Completed)   Comprehensive metabolic panel (Completed)   Colon cancer screening       Relevant Orders   Cologuard      I am having Justen D. Shaff maintain his esomeprazole, loratadine, losartan, and Synthroid. We will continue to administer lidocaine and triamcinolone acetonide.  No orders of the defined types were placed in this encounter.  There are no discontinued medications.  Follow-up: No follow-ups on file.   Crecencio Mc, MD

## 2020-01-26 NOTE — Assessment & Plan Note (Signed)
Chronic morning,  Given ongoing tobacco abuse , Chest x ray ordered

## 2020-01-26 NOTE — Assessment & Plan Note (Signed)
Risks of continued tobacco use were discussed. he is not currently interested in tobacco cessation.  

## 2020-01-29 LAB — FRANCISELLA TULARENSIS ANTIBODY, DA: Francisella Tularensis AB: <1:20 {titer}

## 2020-01-29 LAB — PATHOLOGIST SMEAR REVIEW

## 2020-04-13 ENCOUNTER — Other Ambulatory Visit: Payer: Self-pay

## 2020-04-14 MED ORDER — SYNTHROID 137 MCG PO TABS: 137.0000 ug | ORAL_TABLET | Freq: Every day | ORAL | 0 refills | Status: DC

## 2020-05-10 ENCOUNTER — Telehealth: Payer: Self-pay | Admitting: Internal Medicine

## 2020-05-10 MED ORDER — LOSARTAN POTASSIUM 50 MG PO TABS: 50.0000 mg | ORAL_TABLET | Freq: Every day | ORAL | 1 refills | Status: DC

## 2020-05-10 NOTE — Telephone Encounter (Signed)
Pt only has 3 days remaining on his losartan (COZAAR) 50 MG tablet. Please send to [facility].

## 2020-07-12 ENCOUNTER — Other Ambulatory Visit: Payer: Self-pay | Admitting: Internal Medicine

## 2020-08-09 ENCOUNTER — Other Ambulatory Visit: Payer: Self-pay | Admitting: Internal Medicine

## 2020-10-12 ENCOUNTER — Other Ambulatory Visit: Payer: Self-pay | Admitting: Internal Medicine

## 2020-10-14 ENCOUNTER — Other Ambulatory Visit: Payer: Self-pay

## 2020-10-14 ENCOUNTER — Encounter: Payer: Self-pay | Admitting: Ophthalmology

## 2020-10-25 ENCOUNTER — Other Ambulatory Visit: Payer: Self-pay

## 2020-10-25 ENCOUNTER — Other Ambulatory Visit
Admission: RE | Admit: 2020-10-25 | Discharge: 2020-10-25 | Disposition: A | Discharge status: Home / Self Care | Payer: Self-pay | Source: Ambulatory Visit | Attending: Ophthalmology | Admitting: Ophthalmology

## 2020-10-25 DIAGNOSIS — Z01812 Encounter for preprocedural laboratory examination: Secondary | ICD-10-CM | POA: Insufficient documentation

## 2020-10-25 DIAGNOSIS — Z20822 Contact with and (suspected) exposure to covid-19: Secondary | ICD-10-CM | POA: Insufficient documentation

## 2020-10-26 LAB — SARS CORONAVIRUS 2 (TAT 6-24 HRS): SARS Coronavirus 2: NEGATIVE

## 2020-10-26 NOTE — Anesthesia Preprocedure Evaluation (Addendum)
Anesthesia Evaluation  Patient identified by MRN, date of birth, ID band Patient awake    Reviewed: Allergy & Precautions, NPO status , Patient's Chart, lab work & pertinent test results  History of Anesthesia Complications (+) PONV, PROLONGED EMERGENCE and history of anesthetic complications  Airway Mallampati: II   Neck ROM: Full    Dental  (+)    Pulmonary Current Smoker (greater than 1 ppd) and Patient abstained from smoking.,    Pulmonary exam normal breath sounds clear to auscultation       Cardiovascular Exercise Tolerance: Good hypertension, Normal cardiovascular exam Rhythm:Regular Rate:Normal     Neuro/Psych negative neurological ROS     GI/Hepatic GERD  ,  Endo/Other  Hypothyroidism   Renal/GU negative Renal ROS     Musculoskeletal   Abdominal   Peds  Hematology negative hematology ROS (+)   Anesthesia Other Findings Blindness left eye  Reproductive/Obstetrics                            Anesthesia Physical Anesthesia Plan  ASA: III  Anesthesia Plan: MAC   Post-op Pain Management:    Induction: Intravenous  PONV Risk Score and Plan: 1 and TIVA, Midazolam, Treatment may vary due to age or medical condition and Scopolamine patch - Pre-op  Airway Management Planned: Nasal Cannula  Additional Equipment:   Intra-op Plan:   Post-operative Plan:   Informed Consent: I have reviewed the patients History and Physical, chart, labs and discussed the procedure including the risks, benefits and alternatives for the proposed anesthesia with the patient or authorized representative who has indicated his/her understanding and acceptance.       Plan Discussed with: CRNA  Anesthesia Plan Comments:        Anesthesia Quick Evaluation

## 2020-10-27 ENCOUNTER — Encounter
Admission: RE | Disposition: A | Discharge status: Home / Self Care | Payer: Self-pay | Source: Home / Self Care | Attending: Ophthalmology

## 2020-10-27 ENCOUNTER — Ambulatory Visit: Payer: Self-pay | Admitting: Anesthesiology

## 2020-10-27 ENCOUNTER — Other Ambulatory Visit: Payer: Self-pay

## 2020-10-27 ENCOUNTER — Encounter: Payer: Self-pay | Admitting: Ophthalmology

## 2020-10-27 ENCOUNTER — Ambulatory Visit
Admission: RE | Admit: 2020-10-27 | Discharge: 2020-10-27 | Disposition: A | Discharge status: Home / Self Care | Payer: Self-pay | Attending: Ophthalmology | Admitting: Ophthalmology

## 2020-10-27 DIAGNOSIS — H2511 Age-related nuclear cataract, right eye: Secondary | ICD-10-CM | POA: Insufficient documentation

## 2020-10-27 DIAGNOSIS — Z79899 Other long term (current) drug therapy: Secondary | ICD-10-CM | POA: Insufficient documentation

## 2020-10-27 DIAGNOSIS — Z885 Allergy status to narcotic agent status: Secondary | ICD-10-CM | POA: Insufficient documentation

## 2020-10-27 DIAGNOSIS — Z9109 Other allergy status, other than to drugs and biological substances: Secondary | ICD-10-CM | POA: Insufficient documentation

## 2020-10-27 DIAGNOSIS — Z79890 Hormone replacement therapy (postmenopausal): Secondary | ICD-10-CM | POA: Insufficient documentation

## 2020-10-27 HISTORY — DX: Nausea with vomiting, unspecified: R11.2

## 2020-10-27 HISTORY — DX: Other specified postprocedural states: Z98.890

## 2020-10-27 HISTORY — DX: Family history of other specified conditions: Z84.89

## 2020-10-27 HISTORY — DX: Retained metal fragments, unspecified: Z18.10

## 2020-10-27 HISTORY — PX: CATARACT EXTRACTION W/PHACO: SHX586

## 2020-10-27 SURGERY — PHACOEMULSIFICATION, CATARACT, WITH IOL INSERTION
Anesthesia: Monitor Anesthesia Care | Site: Eye | Laterality: Right

## 2020-10-27 MED ORDER — ACETAMINOPHEN 160 MG/5ML PO SOLN: 325.0000 mg | ORAL | Status: DC | PRN

## 2020-10-27 MED ORDER — SCOPOLAMINE 1 MG/3DAYS TD PT72
1.0000 | MEDICATED_PATCH | Freq: Once | TRANSDERMAL | Status: DC
  Administered 2020-10-27: 1.5 mg via TRANSDERMAL

## 2020-10-27 MED ORDER — FENTANYL CITRATE (PF) 100 MCG/2ML IJ SOLN
INTRAMUSCULAR | Status: DC | PRN
  Administered 2020-10-27: 50 ug via INTRAVENOUS
  Administered 2020-10-27 (×2): 25 ug via INTRAVENOUS

## 2020-10-27 MED ORDER — ACETAMINOPHEN 325 MG PO TABS: 650.0000 mg | ORAL_TABLET | Freq: Once | ORAL | Status: DC | PRN

## 2020-10-27 MED ORDER — ONDANSETRON HCL 4 MG/2ML IJ SOLN
4.0000 mg | Freq: Once | INTRAMUSCULAR | Status: AC | PRN
  Administered 2020-10-27: 4 mg via INTRAVENOUS

## 2020-10-27 MED ORDER — TETRACAINE HCL 0.5 % OP SOLN
1.0000 [drp] | OPHTHALMIC | Status: DC | PRN
  Administered 2020-10-27 (×3): 1 [drp] via OPHTHALMIC

## 2020-10-27 MED ORDER — EPINEPHRINE PF 1 MG/ML IJ SOLN
INTRAOCULAR | Status: DC | PRN
  Administered 2020-10-27: 70 mL via OPHTHALMIC

## 2020-10-27 MED ORDER — ARMC OPHTHALMIC DILATING DROPS
1.0000 "application " | OPHTHALMIC | Status: DC | PRN
  Administered 2020-10-27 (×3): 1 via OPHTHALMIC

## 2020-10-27 MED ORDER — LIDOCAINE HCL (PF) 2 % IJ SOLN
INTRAOCULAR | Status: DC | PRN
  Administered 2020-10-27: 1 mL

## 2020-10-27 MED ORDER — NA HYALUR & NA CHOND-NA HYALUR 0.4-0.35 ML IO KIT
PACK | INTRAOCULAR | Status: DC | PRN
  Administered 2020-10-27: 1 mL via INTRAOCULAR

## 2020-10-27 MED ORDER — CEFUROXIME OPHTHALMIC INJECTION 1 MG/0.1 ML
INJECTION | OPHTHALMIC | Status: DC | PRN
  Administered 2020-10-27: 0.1 mL via INTRACAMERAL

## 2020-10-27 MED ORDER — LACTATED RINGERS IV SOLN: INTRAVENOUS | Status: DC

## 2020-10-27 MED ORDER — MIDAZOLAM HCL 2 MG/2ML IJ SOLN
INTRAMUSCULAR | Status: DC | PRN
  Administered 2020-10-27: 2 mg via INTRAVENOUS

## 2020-10-27 MED ORDER — BRIMONIDINE TARTRATE-TIMOLOL 0.2-0.5 % OP SOLN
OPHTHALMIC | Status: DC | PRN
  Administered 2020-10-27: 1 [drp] via OPHTHALMIC

## 2020-10-27 SURGICAL SUPPLY — 28 items
CANNULA ANT/CHMB 27G (MISCELLANEOUS) ×1 IMPLANT
CANNULA ANT/CHMB 27GA (MISCELLANEOUS) ×2 IMPLANT
GLOVE SURG LX 7.5 STRW (GLOVE) ×2
GLOVE SURG LX STRL 7.5 STRW (GLOVE) ×1 IMPLANT
GLOVE SURG TRIUMPH 8.0 PF LTX (GLOVE) ×2 IMPLANT
GOWN STRL REUS W/ TWL LRG LVL3 (GOWN DISPOSABLE) ×2 IMPLANT
GOWN STRL REUS W/TWL LRG LVL3 (GOWN DISPOSABLE) ×4
LENS IOL TECNIS EYHANCE 22.0 (Intraocular Lens) ×1 IMPLANT
MARKER SKIN DUAL TIP RULER LAB (MISCELLANEOUS) ×2 IMPLANT
NDL CAPSULORHEX 25GA (NEEDLE) ×1 IMPLANT
NDL FILTER BLUNT 18X1 1/2 (NEEDLE) ×2 IMPLANT
NDL RETROBULBAR .5 NSTRL (NEEDLE) IMPLANT
NEEDLE CAPSULORHEX 25GA (NEEDLE) ×2 IMPLANT
NEEDLE FILTER BLUNT 18X 1/2SAF (NEEDLE) ×2
NEEDLE FILTER BLUNT 18X1 1/2 (NEEDLE) ×2 IMPLANT
PACK CATARACT BRASINGTON (MISCELLANEOUS) ×2 IMPLANT
PACK EYE AFTER SURG (MISCELLANEOUS) ×2 IMPLANT
PACK OPTHALMIC (MISCELLANEOUS) ×2 IMPLANT
RING MALYGIN 7.0 (MISCELLANEOUS) IMPLANT
SOLUTION OPHTHALMIC SALT (MISCELLANEOUS) ×2 IMPLANT
SUT ETHILON 10-0 CS-B-6CS-B-6 (SUTURE)
SUT VICRYL  9 0 (SUTURE)
SUT VICRYL 9 0 (SUTURE) IMPLANT
SUTURE EHLN 10-0 CS-B-6CS-B-6 (SUTURE) IMPLANT
SYR 3ML LL SCALE MARK (SYRINGE) ×4 IMPLANT
SYR TB 1ML LUER SLIP (SYRINGE) ×2 IMPLANT
WATER STERILE IRR 250ML POUR (IV SOLUTION) ×2 IMPLANT
WIPE NON LINTING 3.25X3.25 (MISCELLANEOUS) ×2 IMPLANT

## 2020-10-27 NOTE — Anesthesia Procedure Notes (Signed)
Procedure Name: MAC Date/Time: 10/27/2020 12:54 PM Performed by: Jeannene Patella, CRNA Pre-anesthesia Checklist: Patient identified, Emergency Drugs available, Suction available, Timeout performed and Patient being monitored Patient Re-evaluated:Patient Re-evaluated prior to induction Oxygen Delivery Method: Nasal cannula Placement Confirmation: positive ETCO2

## 2020-10-27 NOTE — H&P (Signed)

## 2020-10-27 NOTE — Anesthesia Postprocedure Evaluation (Signed)
Anesthesia Post Note  Patient: Tim Owen  Procedure(s) Performed: CATARACT EXTRACTION PHACO AND INTRAOCULAR LENS PLACEMENT (IOC) RIGHT (Right Eye)     Patient location during evaluation: PACU Anesthesia Type: MAC Level of consciousness: awake and alert, oriented and patient cooperative Pain management: pain level controlled Vital Signs Assessment: post-procedure vital signs reviewed and stable Respiratory status: spontaneous breathing, nonlabored ventilation and respiratory function stable Cardiovascular status: blood pressure returned to baseline and stable Postop Assessment: adequate PO intake Anesthetic complications: no   No complications documented.  Darrin Nipper

## 2020-10-27 NOTE — Discharge Instructions (Signed)
Scopolamine skin patches Remove in 72 hrs. Wash hands immediately after removal. What is this medicine? SCOPOLAMINE (skoe POL a meen) is used to prevent nausea and vomiting caused by motion sickness, anesthesia and surgery. This medicine may be used for other purposes; ask your health care provider or pharmacist if you have questions. COMMON BRAND NAME(S): Transderm Scop What should I tell my health care provider before I take this medicine? They need to know if you have any of these conditions:  are scheduled to have a gastric secretion test  glaucoma  heart disease  kidney disease  liver disease  lung or breathing disease, like asthma  mental illness  prostate disease  seizures  stomach or intestine problems  trouble passing urine  an unusual or allergic reaction to scopolamine, atropine, other medicines, foods, dyes, or preservatives  pregnant or trying to get pregnant  breast-feeding How should I use this medicine? This medicine is for external use only. Follow the directions on the prescription label. Wear only 1 patch at a time. Choose an area behind the ear, that is clean, dry, hairless and free from any cuts or irritation. Wipe the area with a clean dry tissue. Peel off the plastic backing of the skin patch, trying not to touch the adhesive side with your hands. Do not cut the patches. Firmly apply to the area you have chosen, with the metallic side of the patch to the skin and the tan-colored side showing. Once firmly in place, wash your hands well with soap and water. Do not get this medicine into your eyes. After removing the patch, wash your hands and the area behind your ear thoroughly with soap and water. The patch will still contain some medicine after use. To avoid accidental contact or ingestion by children or pets, fold the used patch in half with the sticky side together and throw away in the trash out of the reach of children and pets. If you need to use a  second patch after you remove the first, place it behind the other ear. A special MedGuide will be given to you by the pharmacist with each prescription and refill. Be sure to read this information carefully each time. Talk to your pediatrician regarding the use of this medicine in children. Special care may be needed. Overdosage: If you think you have taken too much of this medicine contact a poison control center or emergency room at once. NOTE: This medicine is only for you. Do not share this medicine with others. What if I miss a dose? This does not apply. This medicine is not for regular use. What may interact with this medicine?  alcohol  antihistamines for allergy cough and cold  atropine  certain medicines for anxiety or sleep  certain medicines for bladder problems like oxybutynin, tolterodine  certain medicines for depression like amitriptyline, fluoxetine, sertraline  certain medicines for stomach problems like dicyclomine, hyoscyamine  certain medicines for Parkinson's disease like benztropine, trihexyphenidyl  certain medicines for seizures like phenobarbital, primidone  general anesthetics like halothane, isoflurane, methoxyflurane, propofol  ipratropium  local anesthetics like lidocaine, pramoxine, tetracaine  medicines that relax muscles for surgery  phenothiazines like chlorpromazine, mesoridazine, prochlorperazine, thioridazine  narcotic medicines for pain  other belladonna alkaloids This list may not describe all possible interactions. Give your health care provider a list of all the medicines, herbs, non-prescription drugs, or dietary supplements you use. Also tell them if you smoke, drink alcohol, or use illegal drugs. Some items may interact with  your medicine. What should I watch for while using this medicine? Limit contact with water while swimming and bathing because the patch may fall off. If the patch falls off, throw it away and put a new one  behind the other ear. You may get drowsy or dizzy. Do not drive, use machinery, or do anything that needs mental alertness until you know how this medicine affects you. Do not stand or sit up quickly, especially if you are an older patient. This reduces the risk of dizzy or fainting spells. Alcohol may interfere with the effect of this medicine. Avoid alcoholic drinks. Your mouth may get dry. Chewing sugarless gum or sucking hard candy, and drinking plenty of water may help. Contact your healthcare professional if the problem does not go away or is severe. This medicine may cause dry eyes and blurred vision. If you wear contact lenses, you may feel some discomfort. Lubricating drops may help. See your healthcare professional if the problem does not go away or is severe. If you are going to need surgery, an MRI, CT scan, or other procedure, tell your healthcare professional that you are using this medicine. You may need to remove the patch before the procedure. What side effects may I notice from receiving this medicine? Side effects that you should report to your doctor or health care professional as soon as possible:  allergic reactions like skin rash, itching or hives; swelling of the face, lips, or tongue  blurred vision  changes in vision  confusion  dizziness  eye pain  fast, irregular heartbeat  hallucinations, loss of contact with reality  nausea, vomiting  pain or trouble passing urine  restlessness  seizures  skin irritation  stomach pain Side effects that usually do not require medical attention (report to your doctor or health care professional if they continue or are bothersome):  drowsiness  dry mouth  headache  sore throat This list may not describe all possible side effects. Call your doctor for medical advice about side effects. You may report side effects to FDA at 1-800-FDA-1088. Where should I keep my medicine? Keep out of the reach of children. Store  at room temperature between 20 and 25 degrees C (68 and 77 degrees F). Keep this medicine in the foil package until ready to use. Throw away any unused medicine after the expiration date. NOTE: This sheet is a summary. It may not cover all possible information. If you have questions about this medicine, talk to your doctor, pharmacist, or health care provider.  2020 Elsevier/Gold Standard (2018-01-04 16:14:46)   Cataract Surgery, Care After This sheet gives you information about how to care for yourself after your procedure. Your health care provider may also give you more specific instructions. If you have problems or questions, contact your health care provider. What can I expect after the procedure? After the procedure, it is common to have:  Itching.  Discomfort.  Fluid discharge.  Sensitivity to light and to touch.  Bruising in or around the eye.  Mild blurred vision. Follow these instructions at home: Eye care   Do not touch or rub your eyes.  Protect your eyes as told by your health care provider. You may be told to wear a protective eye shield or sunglasses.  Do not put a contact lens into the affected eye or eyes until your health care provider approves.  Keep the area around your eye clean and dry: ? Avoid swimming. ? Do not allow water to hit you  directly in the face while showering. ? Keep soap and shampoo out of your eyes.  Check your eye every day for signs of infection. Watch for: ? Redness, swelling, or pain. ? Fluid, blood, or pus. ? Warmth. ? A bad smell. ? Vision that is getting worse. ? Sensitivity that is getting worse. Activity  Do not drive for 24 hours if you were given a sedative during your procedure.  Avoid strenuous activities, such as playing contact sports, for as long as told by your health care provider.  Do not drive or use heavy machinery until your health care provider approves.  Do not bend or lift heavy objects. Bending  increases pressure in the eye. You can walk, climb stairs, and do light household chores.  Ask your health care provider when you can return to work. If you work in a dusty environment, you may be advised to wear protective eyewear for a period of time. General instructions  Take or apply over-the-counter and prescription medicines only as told by your health care provider. This includes eye drops.  Keep all follow-up visits as told by your health care provider. This is important. Contact a health care provider if:  You have increased bruising around your eye.  You have pain that is not helped with medicine.  You have a fever.  You have redness, swelling, or pain in your eye.  You have fluid, blood, or pus coming from your incision.  Your vision gets worse.  Your sensitivity to light gets worse. Get help right away if:  You have sudden loss of vision.  You see flashes of light or spots (floaters).  You have severe eye pain.  You develop nausea or vomiting. Summary  After your procedure, it is common to have itching, discomfort, bruising, fluid discharge, or sensitivity to light.  Follow instructions from your health care provider about caring for your eye after the procedure.  Do not rub your eye after the procedure. You may need to wear eye protection or sunglasses. Do not wear contact lenses. Keep the area around your eye clean and dry.  Avoid activities that require a lot of effort. These include playing sports and lifting heavy objects.  Contact a health care provider if you have increased bruising, pain that does not go away, or a fever. Get help right away if you suddenly lose your vision, see flashes of light or spots, or have severe pain in the eye. This information is not intended to replace advice given to you by your health care provider. Make sure you discuss any questions you have with your health care provider. Document Revised: 08/12/2019 Document Reviewed:  04/15/2018 Elsevier Patient Education  2020 Elsevier Inc.   General Anesthesia, Adult, Care After This sheet gives you information about how to care for yourself after your procedure. Your health care provider may also give you more specific instructions. If you have problems or questions, contact your health care provider. What can I expect after the procedure? After the procedure, the following side effects are common:  Pain or discomfort at the IV site.  Nausea.  Vomiting.  Sore throat.  Trouble concentrating.  Feeling cold or chills.  Weak or tired.  Sleepiness and fatigue.  Soreness and body aches. These side effects can affect parts of the body that were not involved in surgery. Follow these instructions at home:  For at least 24 hours after the procedure:  Have a responsible adult stay with you. It is important to  have someone help care for you until you are awake and alert.  Rest as needed.  Do not: ? Participate in activities in which you could fall or become injured. ? Drive. ? Use heavy machinery. ? Drink alcohol. ? Take sleeping pills or medicines that cause drowsiness. ? Make important decisions or sign legal documents. ? Take care of children on your own. Eating and drinking  Follow any instructions from your health care provider about eating or drinking restrictions.  When you feel hungry, start by eating small amounts of foods that are soft and easy to digest (bland), such as toast. Gradually return to your regular diet.  Drink enough fluid to keep your urine pale yellow.  If you vomit, rehydrate by drinking water, juice, or clear broth. General instructions  If you have sleep apnea, surgery and certain medicines can increase your risk for breathing problems. Follow instructions from your health care provider about wearing your sleep device: ? Anytime you are sleeping, including during daytime naps. ? While taking prescription pain medicines,  sleeping medicines, or medicines that make you drowsy.  Return to your normal activities as told by your health care provider. Ask your health care provider what activities are safe for you.  Take over-the-counter and prescription medicines only as told by your health care provider.  If you smoke, do not smoke without supervision.  Keep all follow-up visits as told by your health care provider. This is important. Contact a health care provider if:  You have nausea or vomiting that does not get better with medicine.  You cannot eat or drink without vomiting.  You have pain that does not get better with medicine.  You are unable to pass urine.  You develop a skin rash.  You have a fever.  You have redness around your IV site that gets worse. Get help right away if:  You have difficulty breathing.  You have chest pain.  You have blood in your urine or stool, or you vomit blood. Summary  After the procedure, it is common to have a sore throat or nausea. It is also common to feel tired.  Have a responsible adult stay with you for the first 24 hours after general anesthesia. It is important to have someone help care for you until you are awake and alert.  When you feel hungry, start by eating small amounts of foods that are soft and easy to digest (bland), such as toast. Gradually return to your regular diet.  Drink enough fluid to keep your urine pale yellow.  Return to your normal activities as told by your health care provider. Ask your health care provider what activities are safe for you. This information is not intended to replace advice given to you by your health care provider. Make sure you discuss any questions you have with your health care provider. Document Revised: 10/19/2017 Document Reviewed: 06/01/2017 Elsevier Patient Education  [place].

## 2020-10-27 NOTE — Op Note (Signed)
LOCATION:  [facility]   PREOPERATIVE DIAGNOSIS:    Nuclear sclerotic cataract right eye. H25.11   POSTOPERATIVE DIAGNOSIS:  Nuclear sclerotic cataract right eye.     PROCEDURE:  Phacoemusification with posterior chamber intraocular lens placement of the right eye   ULTRASOUND TIME: Procedure(s) with comments: CATARACT EXTRACTION PHACO AND INTRAOCULAR LENS PLACEMENT ([facility]) RIGHT (Right) - 4.84 1:05.4 7.4%  LENS:   Implant Name Type Inv. Item Serial No. Manufacturer Lot No. LRB No. Used Action  LENS IOL TECNIS EYHANCE 22.0 - [id number] Intraocular Lens LENS IOL TECNIS EYHANCE 22.0 [id number] JOHNSON   Right 1 Implanted         SURGEON:  Deirdre Evener, MD   ANESTHESIA:  Topical with tetracaine drops and 2% Xylocaine jelly, augmented with 1% preservative-free intracameral lidocaine.    COMPLICATIONS:  None.   DESCRIPTION OF PROCEDURE:  The patient was identified in the holding room and transported to the operating room and placed in the supine position under the operating microscope.  The right eye was identified as the operative eye and it was prepped and draped in the usual sterile ophthalmic fashion.   A 1 millimeter clear-corneal paracentesis was made at the 12:00 position.  0.5 ml of preservative-free 1% lidocaine was injected into the anterior chamber. The anterior chamber was filled with Viscoat viscoelastic.  A 2.4 millimeter keratome was used to make a near-clear corneal incision at the 9:00 position.  A curvilinear capsulorrhexis was made with a cystotome and capsulorrhexis forceps.  Balanced salt solution was used to hydrodissect and hydrodelineate the nucleus.   Phacoemulsification was then used in stop and chop fashion to remove the lens nucleus and epinucleus.  The remaining cortex was then removed using the irrigation and aspiration handpiece. Provisc was then placed into the capsular bag to distend it for lens placement.  A lens was then injected into  the capsular bag.  The remaining viscoelastic was aspirated.   Wounds were hydrated with balanced salt solution.  The anterior chamber was inflated to a physiologic pressure with balanced salt solution.  No wound leaks were noted. Cefuroxime 0.1 ml of a 10mg /ml solution was injected into the anterior chamber for a dose of 1 mg of intracameral antibiotic at the completion of the case.   Timolol and Brimonidine drops were applied to the eye.  The patient was taken to the recovery room in stable condition without complications of anesthesia or surgery.   Masoud Nyce 10/27/2020, 1:10 PM

## 2020-10-27 NOTE — Transfer of Care (Signed)
Immediate Anesthesia Transfer of Care Note  Patient: Tim Owen  Procedure(s) Performed: CATARACT EXTRACTION PHACO AND INTRAOCULAR LENS PLACEMENT (IOC) RIGHT (Right Eye)  Patient Location: PACU  Anesthesia Type: MAC  Level of Consciousness: awake, alert  and patient cooperative  Airway and Oxygen Therapy: Patient Spontanous Breathing and Patient connected to supplemental oxygen  Post-op Assessment: Post-op Vital signs reviewed, Patient's Cardiovascular Status Stable, Respiratory Function Stable, Patent Airway and No signs of Nausea or vomiting  Post-op Vital Signs: Reviewed and stable  Complications: No complications documented.

## 2020-10-28 ENCOUNTER — Encounter: Payer: Self-pay | Admitting: Ophthalmology

## 2020-11-09 ENCOUNTER — Telehealth (INDEPENDENT_AMBULATORY_CARE_PROVIDER_SITE_OTHER): Payer: HRSA Program | Admitting: Internal Medicine

## 2020-11-09 ENCOUNTER — Telehealth: Payer: Self-pay | Admitting: Internal Medicine

## 2020-11-09 ENCOUNTER — Encounter: Payer: Self-pay | Admitting: Internal Medicine

## 2020-11-09 ENCOUNTER — Other Ambulatory Visit: Payer: Self-pay

## 2020-11-09 DIAGNOSIS — U071 COVID-19: Secondary | ICD-10-CM | POA: Insufficient documentation

## 2020-11-09 HISTORY — DX: COVID-19: U07.1

## 2020-11-09 MED ORDER — PROMETHAZINE HCL 12.5 MG PO TABS: 12.5000 mg | ORAL_TABLET | Freq: Three times a day (TID) | ORAL | 0 refills | Status: DC | PRN

## 2020-11-09 MED ORDER — PREDNISONE 10 MG PO TABS: ORAL_TABLET | ORAL | 0 refills | Status: DC

## 2020-11-09 MED ORDER — LEVOFLOXACIN 500 MG PO TABS: 500.0000 mg | ORAL_TABLET | Freq: Every day | ORAL | 0 refills | Status: DC

## 2020-11-09 NOTE — Telephone Encounter (Signed)
Pt wife called patient tested positive for covid this morning. He is has back pain amd a sever headache  They are wanting to know what he needs to take

## 2020-11-09 NOTE — Telephone Encounter (Signed)
Spoke with pt's wife and pt has been scheduled for a virtual visit with afternoon at 2:30.

## 2020-11-09 NOTE — Assessment & Plan Note (Signed)
Tested positive today.  Back ache,  Low grade fever (100.4) , headache sinus congestion ,  Nausea with vomiting.  Supportive care outlined and meds (phenergan, prednisone and levaquin) [facility] for [facility] and for start if sinusitis develops

## 2020-11-09 NOTE — Progress Notes (Signed)
Virtual Visit via caregility  This visit type was conducted due to national recommendations for restrictions regarding the COVID-19 pandemic (e.g. social distancing).  This format is felt to be most appropriate for this patient at this time.  All issues noted in this document were discussed and addressed.  No physical exam was performed (except for noted visual exam findings with Video Visits).   I connected with@ on 11/09/20 at  2:30 PM EST by a video enabled telemedicine application  and verified that I am speaking with the correct person using two identifiers. Location patient: home Location provider: work or home office Persons participating in the virtual visit: patient, provider  I discussed the limitations, risks, security and privacy concerns of performing an evaluation and management service by telephone and the availability of in person appointments. I also discussed with the patient that there may be a patient responsible charge related to this service. The patient expressed understanding and agreed to proceed.   Reason for visit:covid infection   HPI:  55 yr old male with no prior COVID or INFLUENZA  VACCINATION presents with < 24 hours of symptoms of back pain, headache, FEVERS, AND NAUSEA.  VOMITED THIS MORNING BEFORE HE drove out of the driveway  .  [name]  HOME COVID TEST was positive . Only known sick contact was an employee who became ill last Friday ("and I didn't come within 10 feet of her"). No cough.  Had bilateral cataract surgery dec 29,  Was COVID NEGATIVE BY PCR THEN.  Has been congested since November .sinus drainage has changed from clear to yellow for the past 2 days. No facial pain or ear pain currently.   ROS: See pertinent positives and negatives per HPI.  Past Medical History:  Diagnosis Date   Blindness of left eye    Family history of adverse reaction to anesthesia    Sister - PONV and slow to wake   GERD (gastroesophageal reflux disease)    Hypertension     hypothyroidism    PONV (postoperative nausea and vomiting)    also, slow to wake   Retained metal fragment    in left eye lid    Past Surgical History:  Procedure Laterality Date   CATARACT EXTRACTION W/PHACO Right 10/27/2020   Procedure: CATARACT EXTRACTION PHACO AND INTRAOCULAR LENS PLACEMENT ([facility]) RIGHT;  Surgeon: Leandrew Koyanagi, MD;  Location: [facility];  Service: Ophthalmology;  Laterality: Right;  4.84 1:05.4 7.4%   EYE SURGERY  1987   left eye traua, prosthetic eye ,  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 He has a 35.00 pack-year smoking history. He has never used smokeless tobacco. He reports current alcohol use of about 12.0 standard drinks of alcohol per week. He reports that he does not use drugs.   Current Outpatient Medications:    Ascorbic Acid (VITAMIN C) 1000 MG tablet, Take 1,000 mg  by mouth daily., Disp: , Rfl:    esomeprazole (NEXIUM) 20 MG packet, Take 20 mg by mouth daily before breakfast., Disp: , Rfl:    levofloxacin (LEVAQUIN) 500 MG tablet, Take 1 tablet (500 mg total) by mouth daily., Disp: 7 tablet, Rfl: 0   loratadine (CLARITIN) 10 MG tablet, Take 10 mg by mouth daily., Disp: , Rfl:    losartan (COZAAR) 50 MG tablet, TAKE 1 TABLET BY MOUTH DAILY, Disp: 90 tablet, Rfl: 1   predniSONE (DELTASONE) 10 MG tablet, 6 tablets on Day 1 , then reduce by 1 tablet daily until gone, Disp: 21 tablet, Rfl: 0   promethazine (PHENERGAN) 12.5 MG tablet, Take 1 tablet (12.5 mg total) by mouth every 8 (eight) hours as needed for nausea  or vomiting., Disp: 20 tablet, Rfl: 0   SYNTHROID 137 MCG tablet, TAKE 1 TABLET EVERY DAY ON EMPTY STOMACHWITH A GLASS OF WATER AT LEAST 30-60 MINBEFORE BREAKFAST, Disp: 90 tablet, Rfl: 0  Current Facility-Administered Medications:    lidocaine (XYLOCAINE) 2 % (with pres) injection 30 mg, 1.5 mL, Other, Once, Wednesday Ericsson L, MD   triamcinolone acetonide (KENALOG-40) injection 20 mg, 20 mg, Intra-articular, Once, Crecencio Mc, MD  EXAM:  VITALS per patient if applicable:  GENERAL: alert, oriented, appears ill but in no acute distress  HEENT: atraumatic, conjunctiva clear, no obvious abnormalities on inspection of external nose and ears..  Sounds congested.  NECK: normal movements of the head and neck  LUNGS: on inspection no signs of respiratory distress, breathing rate appears normal, no obvious gross SOB, gasping or wheezing  CV: no obvious cyanosis  MS: moves all visible extremities without noticeable abnormality  PSYCH/NEURO: pleasant and cooperative, no obvious depression or anxiety, speech and thought processing grossly intact  ASSESSMENT AND PLAN:  Discussed the following assessment and plan:  COVID-19 virus infection  COVID-19 virus infection Tested positive today.  Back ache,  Low grade fever (100.4) , headache sinus congestion ,  Nausea with vomiting.  Supportive care outlined and meds (phenergan, prednisone and levaquin) [facility] for [facility] and for start if sinusitis develops    I discussed the assessment and treatment plan with the patient. The patient was provided an opportunity to ask questions and all were answered. The patient agreed with the plan and demonstrated an understanding of the instructions.   The patient was advised to call back or seek an in-person evaluation if the symptoms worsen or if the condition fails to improve as anticipated.   I spent 20 minutes dedicated to the care of this patient on the date of this encounter to include  pre-visit review of his medical history,  Face-to-face time with the patient , and post visit ordering of testing and therapeutics.  Crecencio Mc, MD

## 2020-11-09 NOTE — Patient Instructions (Signed)
HERE'S HOW YOU MANAGE COVID INFECTION :  VITAMIN C 1000 MG DAILY VITAMIN D 4000 IU DAILY  ZINC 50 MG DAILY   (stop these megadoses once you are feeling better)  I have prescribed  the phenergan (promethazine ) for the nausea and vomiting and sent it to [facility]   Family member  needs to pick up gatorade, ginger ale,   and Chicken broth.  No sugar free drinks .   And once you  can keep those down , you  can add noodles and saltines and white meat chicken .    You can take 2400 mg of advil daily in divided doses (600 mg every 6 hours)  Once your nausea has resolved.  You can [place] or add  up to 2000 mg of acetominophen (tylenol) every day safely  In divided doses (500 mg every 6 hours  Or 1000 mg every 12 hours.)  For your back pain   Use sudafed,  Sudafed PE or Afrin nasal spray for the congestion.   If your sinus congestion lasts more than 5 days , or if you develop facial pain or ear pain,  start the prednisone and levaquin (antibiotic) I also sent to pharmacy  Take a probiotic (or eat yogurt) daily for 3 weeks IF YOU START THE LEVAQUIN  DO NOT COMBINE PREDNISONE WITH ADVIL (STOP THE ADVIL IF YOU START THE PREDNISONE)  OK TO TAKE TYLENOL WITH PREDNISONE:   YOU SHOULD BE FEVER FREE FOR 24 HOURS WITHOUT THE USE OF ADVIL OR MOTRIN ,  AND ALL SYMPTOMS NEED TO BE IMPROVING BEFORE YOU CAN BREAK QUARANTINE (MINIMUM OF 7 DAYS  FROM DAY 1 OF SYMPTOMS )

## 2021-01-11 ENCOUNTER — Other Ambulatory Visit: Payer: Self-pay | Admitting: Internal Medicine

## 2021-01-26 ENCOUNTER — Ambulatory Visit: Payer: Self-pay | Admitting: Internal Medicine

## 2021-01-26 ENCOUNTER — Other Ambulatory Visit: Payer: Self-pay

## 2021-01-26 ENCOUNTER — Encounter: Payer: Self-pay | Admitting: Internal Medicine

## 2021-01-26 VITALS — BP 98/62 | HR 71 | Temp 97.6°F | Resp 15 | Ht 69.0 in | Wt 188.0 lb

## 2021-01-26 DIAGNOSIS — D171 Benign lipomatous neoplasm of skin and subcutaneous tissue of trunk: Secondary | ICD-10-CM

## 2021-01-26 DIAGNOSIS — Z716 Tobacco abuse counseling: Secondary | ICD-10-CM

## 2021-01-26 DIAGNOSIS — I10 Essential (primary) hypertension: Secondary | ICD-10-CM

## 2021-01-26 DIAGNOSIS — E785 Hyperlipidemia, unspecified: Secondary | ICD-10-CM

## 2021-01-26 DIAGNOSIS — Z125 Encounter for screening for malignant neoplasm of prostate: Secondary | ICD-10-CM

## 2021-01-26 DIAGNOSIS — Z970 Presence of artificial eye: Secondary | ICD-10-CM

## 2021-01-26 DIAGNOSIS — Z0000 Encounter for general adult medical examination without abnormal findings: Secondary | ICD-10-CM

## 2021-01-26 DIAGNOSIS — R7303 Prediabetes: Secondary | ICD-10-CM

## 2021-01-26 DIAGNOSIS — G2581 Restless legs syndrome: Secondary | ICD-10-CM

## 2021-01-26 DIAGNOSIS — E039 Hypothyroidism, unspecified: Secondary | ICD-10-CM

## 2021-01-26 LAB — IBC + FERRITIN
Ferritin: 235.5 ng/mL (ref 22.0–322.0)
Iron: 120 ug/dL (ref 42–165)
Saturation Ratios: 34.8 % (ref 20.0–50.0)
Transferrin: 246 mg/dL (ref 212.0–360.0)

## 2021-01-26 LAB — TSH: TSH: 2.19 u[IU]/mL (ref 0.35–4.50)

## 2021-01-26 LAB — COMPREHENSIVE METABOLIC PANEL
ALT: 23 U/L (ref 0–53)
AST: 19 U/L (ref 0–37)
Albumin: 4.7 g/dL (ref 3.5–5.2)
Alkaline Phosphatase: 49 U/L (ref 39–117)
BUN: 19 mg/dL (ref 6–23)
CO2: 26 mEq/L (ref 19–32)
Calcium: 9.9 mg/dL (ref 8.4–10.5)
Chloride: 103 mEq/L (ref 96–112)
Creatinine, Ser: 1.1 mg/dL (ref 0.40–1.50)
GFR: 75.41 mL/min (ref >60.00)
Glucose, Bld: 98 mg/dL (ref 70–99)
Potassium: 4.6 mEq/L (ref 3.5–5.1)
Sodium: 139 mEq/L (ref 135–145)
Total Bilirubin: 0.7 mg/dL (ref 0.2–1.2)
Total Protein: 6.9 g/dL (ref 6.0–8.3)

## 2021-01-26 LAB — LIPID PANEL
Cholesterol: 174 mg/dL (ref 0–200)
HDL: 50.9 mg/dL (ref >39.00)
LDL Cholesterol: 110 mg/dL — ABNORMAL HIGH (ref 0–99)
NonHDL: 122.61
Total CHOL/HDL Ratio: 3
Triglycerides: 63 mg/dL (ref 0.0–149.0)
VLDL: 12.6 mg/dL (ref 0.0–40.0)

## 2021-01-26 LAB — PSA: PSA: 0.61 ng/mL (ref 0.10–4.00)

## 2021-01-26 MED ORDER — ZOSTER VAC RECOMB ADJUVANTED 50 MCG/0.5ML IM SUSR: 0.5000 mL | Freq: Once | INTRAMUSCULAR | 1 refills | Status: AC

## 2021-01-26 MED ORDER — TETANUS-DIPHTH-ACELL PERTUSSIS 5-2.5-18.5 LF-MCG/0.5 IM SUSY: 0.5000 mL | PREFILLED_SYRINGE | Freq: Once | INTRAMUSCULAR | 0 refills | Status: AC

## 2021-01-26 NOTE — Assessment & Plan Note (Signed)
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

## 2021-01-26 NOTE — Progress Notes (Signed)
Patient ID: Tim Owen, male    DOB: 21-Aug-1966  Age: 55 y.o. MRN: [id number]  The patient is here for annual  wellness examination and management of other chronic and acute problems.  This visit occurred during the SARS-CoV-2 public health emergency.  Safety protocols were in place, including screening questions prior to the visit, additional usage of staff PPE, and extensive cleaning of exam room while observing appropriate contact time as indicated for disinfecting solutions.      The risk factors are reflected in the social history.  The roster of all physicians providing medical care to patient - is listed in the Snapshot section of the chart.  Activities of daily living:  The patient is 100% independent in all ADLs: dressing, toileting, feeding as well as independent mobility  Home safety : The patient has smoke detectors in the home. They wear seatbelts.  There are no firearms at home. There is no violence in the home.   There is no risks for hepatitis, STDs or HIV. There is no   history of blood transfusion. They have no travel history to infectious disease endemic areas of the world.  The patient has seen their dentist in the last six month. They have seen their eye doctor in the last year. They admit to slight hearing difficulty with regard to whispered voices and some television programs.  They have deferred audiologic testing in the last year.  They do not  have excessive sun exposure. Discussed the need for sun protection: hats, long sleeves and use of sunscreen if there is significant sun exposure.   Diet: the importance of a healthy diet is discussed. They do have a healthy diet.  The benefits of regular aerobic exercise were discussed. He is physically active daily .   Depression screen: there are no signs or vegative symptoms of depression- irritability, change in appetite, anhedonia, sadness/tearfullness.  The following portions of the patient's history were reviewed and  updated as appropriate: allergies, current medications, past family history, past medical history,  past surgical history, past social history  and problem list.  Visual acuity was not assessed per patient preference since she has regular follow up with her ophthalmologist. Hearing and body mass index were assessed and reviewed.   During the course of the visit the patient was educated and counseled about appropriate screening and preventive services including : fall prevention , diabetes screening, nutrition counseling, colorectal cancer screening, and recommended immunizations.    CC: The primary encounter diagnosis was Encounter for preventive health examination. Diagnoses of Prosthetic eye globe, Hypothyroidism (acquired), Hyperlipidemia LDL goal <100, Essential hypertension, Prostate cancer screening, Restless legs, Lipoma of back, Prediabetes, and Tobacco abuse counseling were also pertinent to this visit.  Recently had cataract surgery on his right  eye by [facility]   Vision corrected to 66/20 Has a prosthetic left eye  Hypertension :  Home readings checked and most are 120/70 on losartan 50 mg daily Some sleep latency issues  But does not want meds.  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 Wants to go to [place] infection after his eye surgery.  missed 26  days of work all in all.  headache fevers and body aches, brain fog, and altered taste for one month     History Paulo has a past medical history of Blindness of left eye, COVID-19 virus infection (11/09/2020), Family history of adverse reaction to anesthesia, GERD (gastroesophageal reflux disease),  Hypertension, hypothyroidism, PONV (postoperative nausea and vomiting), and Retained metal fragment.   He has a past surgical history that includes Eye surgery (1987); Knee arthroscopy (2003); and Cataract extraction w/PHACO (Right, 10/27/2020).   His family history includes Alcohol abuse in his father; Aneurysm in his mother; COPD in his mother; Dementia in his maternal aunt; Diabetes in his sister; Early death in his sister; Heart disease (age of onset: 57) in his father; Stroke (age of onset: 40) in his mother.He reports that he has been smoking cigarettes. He has a 35.00 pack-year smoking history. He has never used smokeless tobacco. He reports current alcohol use of about 12.0 standard drinks of alcohol per week. He reports that he does not use drugs.  Outpatient Medications Prior to Visit  Medication Sig Dispense Refill  . Ascorbic Acid (VITAMIN C) 1000 MG tablet Take 1,000 mg by mouth daily.    . Cholecalciferol (VITAMIN D3) 50 MCG (2000 UT) CAPS Take 1 capsule by mouth daily.    [name] esomeprazole (NEXIUM) 20 MG packet Take 20 mg by mouth daily before breakfast.    . loratadine (CLARITIN) 10 MG tablet Take 10 mg by mouth daily.    [name] losartan (COZAAR) 50 MG tablet TAKE 1 TABLET BY MOUTH DAILY 90 tablet 1  . SYNTHROID 137 MCG tablet TAKE 1 TABLET EVERY DAY ON EMPTY STOMACHWITH A GLASS OF WATER AT LEAST 30-60 MINBEFORE BREAKFAST 90 tablet 1  . zinc gluconate 50 MG tablet Take 50 mg by mouth daily.    [name] levofloxacin (LEVAQUIN) 500 MG tablet Take 1 tablet (500 mg total) by mouth daily. (Patient not taking: Reported on 01/26/2021) 7 tablet 0  . predniSONE (DELTASONE) 10 MG tablet 6 tablets on Day 1 , then reduce by 1 tablet daily until gone (Patient not taking: Reported on 01/26/2021) 21 tablet 0  . promethazine (PHENERGAN) 12.5 MG tablet Take 1 tablet (12.5 mg total) by mouth every 8 (eight) hours as needed for nausea or vomiting. (Patient not taking: Reported on 01/26/2021) 20 tablet 0    Facility-Administered Medications Prior to Visit  Medication Dose Route Frequency Provider Last Rate Last Admin  . lidocaine (XYLOCAINE) 2 % (with pres) injection 30 mg  1.5 mL Other Once Crecencio Mc, MD      . triamcinolone acetonide (KENALOG-40) injection 20 mg  20 mg Intra-articular Once Crecencio Mc, MD        Review of Systems   Patient denies headache, fevers, malaise, unintentional weight loss, skin rash, eye pain, sinus congestion and sinus pain, sore throat, dysphagia,  hemoptysis , cough, dyspnea, wheezing, chest pain, palpitations, orthopnea, edema, abdominal pain, nausea, melena, diarrhea, constipation, flank pain, dysuria, hematuria, urinary  Frequency, nocturia, numbness, tingling, seizures,  Focal weakness, Loss of consciousness,  Tremor, insomnia, depression, anxiety, and suicidal ideation.     Objective:  BP 98/62 (BP Location: Right Arm, Patient Position: Sitting, Cuff Size: Normal)   Pulse 71   Temp 97.6 F (36.4 C) (Oral)   Resp 15   Ht 5\' 9"  (1.753 m)   Wt 188 lb (85.3 kg)   SpO2 97%   BMI 27.76 kg/m   Physical Exam  General appearance: alert, cooperative  and appears stated age Eyes: right eye PRL, left eye prosthetic  Ears: normal TM's and external ear canals both ears Throat: lips, mucosa, and tongue normal; teeth and gums normal Neck: no adenopathy, no carotid bruit, supple, symmetrical, trachea midline and thyroid not enlarged, symmetric, no tenderness/mass/nodules Back: symmetric, no curvature. ROM normal. No CVA tenderness. Lungs: clear to auscultation bilaterally Heart: regular rate and rhythm, S1, S2 normal, no murmur, click, rub or gallop Abdomen: soft, non-tender; bowel sounds normal; no masses,  no organomegaly Back:  4 cm nontender subcutaneous mass left lateral oblique c/w lipoma Pulses: 2+ and symmetric Skin: Skin color, texture, turgor normal. No rashes or lesions Lymph nodes: Cervical, supraclavicular, and axillary nodes  normal.  Assessment & Plan:   Problem List Items Addressed This Visit      Unprioritized   Lipoma of back   Relevant Orders   Ambulatory referral to Dermatology   Hypothyroidism (acquired)    Thyroid function is WNL on 137 mcg daily dose .  No current changes needed.   Lab Results  Component Value Date   TSH 2.19 01/26/2021         Relevant Orders   TSH (Completed)   Hyperlipidemia LDL goal <100      Lipids are untreated currently due to trial of pravastatin causing severe myalgias. He is unwilling to consider alternative therapy   Lab Results  Component Value Date   CHOL 174 01/26/2021   HDL 50.90 01/26/2021   LDLCALC 110 (H) 01/26/2021   LDLDIRECT 145.0 12/03/2015   TRIG 63.0 01/26/2021   CHOLHDL 3 01/26/2021         Relevant Orders   Lipid panel (Completed)   Restless legs    He declines therapy currently.  Iron sotres are normal today  Lab Results  Component Value Date   IRON 120 01/26/2021   FERRITIN 235.5 01/26/2021   Lab Results  Component Value Date   WBC 6.2 01/26/2020   HGB 15.6 01/26/2020   HCT 45.0 01/26/2020   MCV 99.4 01/26/2020   PLT 235.0 01/26/2020         Relevant Orders   IBC + Ferritin (Completed)   Essential hypertension    Well controlled on current regimen of losartan 50 mg daily . Renal function stable, no changes today.      Relevant Orders   Comprehensive metabolic panel (Completed)   Prediabetes    His random glucose was not  elevated and his last  A1c suggested he is at risk for developing diabetes.  He has been following a low glycemic index diet and particpates regularly in an aerobic  exercise activity.   Lab Results  Component Value Date   HGBA1C 5.7 01/26/2020          Encounter for preventive health examination - Primary    .age appropriate education and counseling updated, referrals for preventative services and immunizations addressed, dietary and smoking counseling addressed, most recent labs reviewed.   I have personally reviewed and have noted:  1) the patient's medical and social history 2) The pt's use of alcohol, tobacco, and illicit drugs 3) The patient's current medications and supplements 4) Functional ability including ADL's, fall risk, home safety risk, hearing and visual impairment 5) Diet and physical activities 6) Evidence for depression or mood disorder 7) The patient's height, weight, and BMI have been recorded in the chart  I have made referrals, and provided counseling and education based on review of the above.  He DECLINES colon  ca screening of any sort.       Tobacco abuse counseling    Risks of continued tobacco use were discussed. he is not currently interested in tobacco cessation.       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 Szczepanik's predniSONE, levofloxacin, and promethazine. I am also having him start on Tdap and Zoster Vaccine Adjuvanted. Additionally, I am having him maintain his esomeprazole, loratadine, losartan, vitamin C, Synthroid, zinc gluconate, and vitamin D3. We will continue to administer lidocaine and triamcinolone acetonide.  Meds ordered this encounter  Medications  . Tdap (BOOSTRIX) 5-2.5-18.5 LF-MCG/0.5 injection    Sig: Inject 0.5 mLs into the muscle once for 1 dose.    Dispense:  0.5 mL    Refill:  0  . Zoster Vaccine Adjuvanted [facility]) injection    Sig: Inject 0.5 mLs into the muscle once for 1 dose.    Dispense:  1 each    Refill:  1    Medications Discontinued During This Encounter  Medication Reason  . levofloxacin (LEVAQUIN) 500 MG tablet   . predniSONE (DELTASONE) 10 MG tablet   . promethazine (PHENERGAN) 12.5 MG tablet     Follow-up: Return in about 6 months (around 07/29/2021).   Crecencio Mc, MD

## 2021-01-26 NOTE — Patient Instructions (Addendum)
Our goal in treating hypertension is 120/70.  You are a little low today.  If your BP readings are continually under 110/60 (either reading ),  Reduce losartan dose to 25 mg   Remember that all hemorrhoid treatments are now available OTC. If your stools become hard,  This will aggravate a hemorrhoid, in that case you can use  a stool softener (docusate 100 mg ) up to 200  Or a bulk forming laxative (citrucel,  Miralax)  Use of anusol HC or any hemorrhoid  cream that has pramoxine in it will treat itching and inflammation,  and use Nupercainal for pain   Your tetanus vaccine is due every ten years,  Last one was Dec 2012;  due by December.    The ShingRx vaccine is now available in local pharmacies and is much more protective than the old one  Zostavax  (it is about 97%  Effective in preventing shingles). .   It is therefore ADVISED for all interested adults over 50 to prevent shingles so I have printed you a prescription for it.  (it requires a 2nd dose 2 to 6 months after the first one) .  It will cause you to have flu  like symptoms for 2 days    Health Maintenance, Male Adopting a healthy lifestyle and getting preventive care are important in promoting health and wellness. Ask your health care provider about:  The right schedule for you to have regular tests and exams.  Things you can do on your own to prevent diseases and keep yourself healthy. What should I know about diet, weight, and exercise? Eat a healthy diet  Eat a diet that includes plenty of vegetables, fruits, low-fat dairy products, and lean protein.  Do not eat a lot of foods that are high in solid fats, added sugars, or sodium.   Maintain a healthy weight Body mass index (BMI) is a measurement that can be used to identify possible weight problems. It estimates body fat based on height and weight. Your health care provider can help determine your BMI and help you achieve or maintain a healthy weight. Get regular  exercise Get regular exercise. This is one of the most important things you can do for your health. Most adults should:  Exercise for at least 150 minutes each week. The exercise should increase your heart rate and make you sweat (moderate-intensity exercise).  Do strengthening exercises at least twice a week. This is in addition to the moderate-intensity exercise.  Spend less time sitting. Even light physical activity can be beneficial. Watch cholesterol and blood lipids Have your blood tested for lipids and cholesterol at 56 years of age, then have this test every 5 years. You may need to have your cholesterol levels checked more often if:  Your lipid or cholesterol levels are high.  You are older than 56 years of age.  You are at high risk for heart disease. What should I know about cancer screening? Many types of cancers can be detected early and may often be prevented. Depending on your health history and family history, you may need to have cancer screening at various ages. This may include screening for:  Colorectal cancer.  Prostate cancer.  Skin cancer.  Lung cancer. What should I know about heart disease, diabetes, and high blood pressure? Blood pressure and heart disease  High blood pressure causes heart disease and increases the risk of stroke. This is more likely to develop in people who have high blood pressure  readings, are of African descent, or are overweight.  Talk with your health care provider about your target blood pressure readings.  Have your blood pressure checked: ? Every 3-5 years if you are 49-59 years of age. ? Every year if you are 11 years old or older.  If you are between the ages of 68 and 62 and are a current or former smoker, ask your health care provider if you should have a one-time screening for abdominal aortic aneurysm (AAA). Diabetes Have regular diabetes screenings. This checks your fasting blood sugar level. Have the screening  done:  Once every three years after age 9 if you are at a normal weight and have a low risk for diabetes.  More often and at a younger age if you are overweight or have a high risk for diabetes. What should I know about preventing infection? Hepatitis B If you have a higher risk for hepatitis B, you should be screened for this virus. Talk with your health care provider to find out if you are at risk for hepatitis B infection. Hepatitis C Blood testing is recommended for:  Everyone born from [birth year] through 1966.  Anyone with known risk factors for hepatitis C. Sexually transmitted infections (STIs)  You should be screened each year for STIs, including gonorrhea and chlamydia, if: ? You are sexually active and are younger than 56 years of age. ? You are older than 56 years of age and your health care provider tells you that you are at risk for this type of infection. ? Your sexual activity has changed since you were last screened, and you are at increased risk for chlamydia or gonorrhea. Ask your health care provider if you are at risk.  Ask your health care provider about whether you are at high risk for HIV. Your health care provider may recommend a prescription medicine to help prevent HIV infection. If you choose to take medicine to prevent HIV, you should first get tested for HIV. You should then be tested every 3 months for as long as you are taking the medicine. Follow these instructions at home: Lifestyle  Do not use any products that contain nicotine or tobacco, such as cigarettes, e-cigarettes, and chewing tobacco. If you need help quitting, ask your health care provider.  Do not use street drugs.  Do not share needles.  Ask your health care provider for help if you need support or information about quitting drugs. Alcohol use  Do not drink alcohol if your health care provider tells you not to drink.  If you drink alcohol: ? Limit how much you have to 0-2 drinks a  day. ? Be aware of how much alcohol is in your drink. In the [place] one drink equals one 12 oz bottle of beer (355 mL), one 5 oz glass of wine (148 mL), or one 1 oz glass of hard liquor (44 mL). General instructions  Schedule regular health, dental, and eye exams.  Stay current with your vaccines.  Tell your health care provider if: ? You often feel depressed. ? You have ever been abused or do not feel safe at home. Summary  Adopting a healthy lifestyle and getting preventive care are important in promoting health and wellness.  Follow your health care provider's instructions about healthy diet, exercising, and getting tested or screened for diseases.  Follow your health care provider's instructions on monitoring your cholesterol and blood pressure. This information is not intended to replace advice given to you by your  health care provider. Make sure you discuss any questions you have with your health care provider. Document Revised: 10/09/2018 Document Reviewed: 10/09/2018 Elsevier Patient Education  2021 [employer].

## 2021-01-29 ENCOUNTER — Encounter: Payer: Self-pay | Admitting: Internal Medicine

## 2021-01-29 NOTE — Assessment & Plan Note (Signed)
He declines therapy currently.  Iron sotres are normal today  Lab Results  Component Value Date   IRON 120 01/26/2021   FERRITIN 235.5 01/26/2021   Lab Results  Component Value Date   WBC 6.2 01/26/2020   HGB 15.6 01/26/2020   HCT 45.0 01/26/2020   MCV 99.4 01/26/2020   PLT 235.0 01/26/2020

## 2021-01-29 NOTE — Assessment & Plan Note (Signed)
His random glucose was not  elevated and his last  A1c suggested he is at risk for developing diabetes.  He has been following a low glycemic index diet and particpates regularly in an aerobic  exercise activity.   Lab Results  Component Value Date   HGBA1C 5.7 01/26/2020

## 2021-01-29 NOTE — Assessment & Plan Note (Signed)
Lipids are untreated currently due to trial of pravastatin causing severe myalgias. He is unwilling to consider alternative therapy   Lab Results  Component Value Date   CHOL 174 01/26/2021   HDL 50.90 01/26/2021   LDLCALC 110 (H) 01/26/2021   LDLDIRECT 145.0 12/03/2015   TRIG 63.0 01/26/2021   CHOLHDL 3 01/26/2021

## 2021-01-29 NOTE — Assessment & Plan Note (Signed)
Risks of continued tobacco use were discussed. he is not currently interested in tobacco cessation.  

## 2021-01-29 NOTE — Assessment & Plan Note (Signed)
.  age appropriate education and counseling updated, referrals for preventative services and immunizations addressed, dietary and smoking counseling addressed, most recent labs reviewed.  I have personally reviewed and have noted:  1) the patient's medical and social history 2) The pt's use of alcohol, tobacco, and illicit drugs 3) The patient's current medications and supplements 4) Functional ability including ADL's, fall risk, home safety risk, hearing and visual impairment 5) Diet and physical activities 6) Evidence for depression or mood disorder 7) The patient's height, weight, and BMI have been recorded in the chart  I have made referrals, and provided counseling and education based on review of the above.  He DECLINES colon ca screening of any sort.

## 2021-01-29 NOTE — Assessment & Plan Note (Signed)
Thyroid function is WNL on 137 mcg daily dose .  No current changes needed.   Lab Results  Component Value Date   TSH 2.19 01/26/2021

## 2021-01-29 NOTE — Assessment & Plan Note (Signed)
Well controlled on current regimen of losartan 50 mg daily. Renal function stable, no changes today. 

## 2021-02-04 ENCOUNTER — Other Ambulatory Visit: Payer: Self-pay | Admitting: Internal Medicine

## 2021-02-09 ENCOUNTER — Telehealth: Payer: Self-pay | Admitting: Family

## 2021-02-09 DIAGNOSIS — R059 Cough, unspecified: Secondary | ICD-10-CM

## 2021-02-09 MED ORDER — BENZONATATE 100 MG PO CAPS: 100.0000 mg | ORAL_CAPSULE | Freq: Three times a day (TID) | ORAL | 0 refills | Status: DC | PRN

## 2021-02-09 MED ORDER — FLUTICASONE PROPIONATE 50 MCG/ACT NA SUSP: 2.0000 | Freq: Every day | NASAL | 6 refills | Status: DC

## 2021-02-09 NOTE — Progress Notes (Signed)
We are sorry you are not feeling well.  Here is how we plan to help!  Based on what you have shared with me, it looks like you may have a viral upper respiratory infection.  Upper respiratory infections are caused by a large number of viruses; however, rhinovirus is the most common cause.   Given your symptoms you do need to be COVID tested to rule out.   Symptoms vary from person to person, with common symptoms including sore throat, cough, fatigue or lack of energy and feeling of general discomfort.  A low-grade fever of up to 100.4 may present, but is often uncommon.  Symptoms vary however, and are closely related to a person's age or underlying illnesses.  The most common symptoms associated with an upper respiratory infection are nasal discharge or congestion, cough, sneezing, headache and pressure in the ears and face.  These symptoms usually persist for about 3 to 10 days, but can last up to 2 weeks.  It is important to know that upper respiratory infections do not cause serious illness or complications in most cases.    Upper respiratory infections can be transmitted from person to person, with the most common method of transmission being a person's hands.  The virus is able to live on the skin and can infect other persons for up to 2 hours after direct contact.  Also, these can be transmitted when someone coughs or sneezes; thus, it is important to cover the mouth to reduce this risk.  To keep the spread of the illness at [place], good hand hygiene is very important.  This is an infection that is most likely caused by a virus. There are no specific treatments other than to help you with the symptoms until the infection runs its course.  We are sorry you are not feeling well.  Here is how we plan to help!   For nasal congestion, you may use an oral decongestants such as Mucinex D or if you have glaucoma or high blood pressure use plain Mucinex.  Saline nasal spray or nasal drops can help and can  safely be used as often as needed for congestion.  For your congestion, I have prescribed Fluticasone nasal spray one spray in each nostril twice a day  If you do not have a history of heart disease, hypertension, diabetes or thyroid disease, prostate/bladder issues or glaucoma, you may also use Sudafed to treat nasal congestion.  It is highly recommended that you consult with a pharmacist or your primary care physician to ensure this medication is safe for you to take.     If you have a cough, you may use cough suppressants such as Delsym and Robitussin.  If you have glaucoma or high blood pressure, you can also use Coricidin HBP.   For cough I have prescribed for you A prescription cough medication called Tessalon Perles 100 mg. You may take 1-2 capsules every 8 hours as needed for cough  If you have a sore or scratchy throat, use a saltwater gargle-  to  teaspoon of salt dissolved in a 4-ounce to 8-ounce glass of warm water.  Gargle the solution for approximately 15-30 seconds and then spit.  It is important not to swallow the solution.  You can also use throat lozenges/cough drops and Chloraseptic spray to help with throat pain or discomfort.  Warm or cold liquids can also be helpful in relieving throat pain.  For headache, pain or general discomfort, you can use Ibuprofen or  Tylenol as directed.   Some authorities believe that zinc sprays or the use of Echinacea may shorten the course of your symptoms.   HOME CARE . Only take medications as instructed by your medical team. . Be sure to drink plenty of fluids. Water is fine as well as fruit juices, sodas and electrolyte beverages. You may want to stay away from caffeine or alcohol. If you are nauseated, try taking small sips of liquids. How do you know if you are getting enough fluid? Your urine should be a pale yellow or almost colorless. . Get rest. . Taking a steamy shower or using a humidifier may help nasal congestion and ease sore throat  pain. You can place a towel over your head and breathe in the steam from hot water coming from a faucet. . Using a saline nasal spray works much the same way. . Cough drops, hard candies and sore throat lozenges may ease your cough. . Avoid close contacts especially the very young and the elderly . Cover your mouth if you cough or sneeze . Always remember to wash your hands.   GET HELP RIGHT AWAY IF: . You develop worsening fever. . If your symptoms do not improve within 10 days . You develop yellow or green discharge from your nose over 3 days. . You have coughing fits . You develop a severe head ache or visual changes. . You develop shortness of breath, difficulty breathing or start having chest pain . Your symptoms persist after you have completed your treatment plan  MAKE SURE YOU   Understand these instructions.  Will watch your condition.  Will get help right away if you are not doing well or get worse.  Your e-visit answers were reviewed by a board certified advanced clinical practitioner to complete your personal care plan. Depending upon the condition, your plan could have included both over the counter or prescription medications. Please review your pharmacy choice. If there is a problem, you may call our nursing hot line at and have the prescription routed to another pharmacy. Your safety is important to [place]. If you have drug allergies check your prescription carefully.   You can use MyChart to ask questions about today's visit, request a non-urgent call back, or ask for a work or school excuse for 24 hours related to this e-Visit. If it has been greater than 24 hours you will need to follow up with your provider, or enter a new e-Visit to address those concerns. You will get an e-mail in the next two days asking about your experience.  I hope that your e-visit has been valuable and will speed your recovery. Thank you for using e-visits.  Approximately 5 minutes was spent  documenting and reviewing patient's chart.

## 2021-04-08 ENCOUNTER — Ambulatory Visit (INDEPENDENT_AMBULATORY_CARE_PROVIDER_SITE_OTHER): Payer: Self-pay | Admitting: Dermatology

## 2021-04-08 ENCOUNTER — Other Ambulatory Visit: Payer: Self-pay

## 2021-04-08 DIAGNOSIS — L820 Inflamed seborrheic keratosis: Secondary | ICD-10-CM

## 2021-04-08 DIAGNOSIS — L720 Epidermal cyst: Secondary | ICD-10-CM

## 2021-04-08 DIAGNOSIS — L578 Other skin changes due to chronic exposure to nonionizing radiation: Secondary | ICD-10-CM

## 2021-04-08 DIAGNOSIS — L570 Actinic keratosis: Secondary | ICD-10-CM

## 2021-04-08 NOTE — Patient Instructions (Addendum)

## 2021-04-08 NOTE — Progress Notes (Signed)
   Follow-Up Visit   Subjective  Tim Owen is a 55 y.o. male who presents for the following: Skin Problem (Patient with a spot of concern at left neck. Present for at least 3-4 months. It does not itch and has not bled but patient is very aware of it and thinks he may have knocked some of it off with shaving. ).  The following portions of the chart were reviewed this encounter and updated as appropriate:   Tobacco  Allergies  Meds  Problems  Med Hx  Surg Hx  Fam Hx      Review of Systems:  No other skin or systemic complaints except as noted in HPI or Assessment and Plan.  Objective  Well appearing patient in no apparent distress; mood and affect are within normal limits.  A focused examination was performed including neck. Relevant physical exam findings are noted in the Assessment and Plan.  Right lateral Neck x 1 Erythematous keratotic or waxy stuck-on papule or plaque.   Right Cheek Zygoma Erythematous thin papules/macules with gritty scale.   Left Lower Back Subcutaneous nodule 3.1cm   Assessment & Plan  Inflamed seborrheic keratosis Right lateral Neck x 1  Destruction of lesion - Right lateral Neck x 1 Complexity: simple   Destruction method: cryotherapy   Informed consent: discussed and consent obtained   Timeout:  patient name, date of birth, surgical site, and procedure verified Lesion destroyed using liquid nitrogen: Yes   Region frozen until ice ball extended beyond lesion: Yes   Outcome: patient tolerated procedure well with no complications   Post-procedure details: wound care instructions given    AK (actinic keratosis) Right Cheek Zygoma  Destruction of lesion - Right Cheek Zygoma Complexity: simple   Destruction method: cryotherapy   Informed consent: discussed and consent obtained   Timeout:  patient name, date of birth, surgical site, and procedure verified Lesion destroyed using liquid nitrogen: Yes   Region frozen until ice ball  extended beyond lesion: Yes   Outcome: patient tolerated procedure well with no complications   Post-procedure details: wound care instructions given    Epidermal inclusion cyst Left Lower Back  Benign-appearing. Exam most consistent with an epidermal inclusion cyst. Discussed that a cyst is a benign growth that can grow over time and sometimes get irritated or inflamed. Recommend observation if it is not bothersome. Discussed option of surgical excision to remove it if it is growing, symptomatic, or other changes noted. Please call for new or changing lesions so they can be evaluated.   Actinic Damage - chronic, secondary to cumulative UV radiation exposure/sun exposure over time - diffuse scaly erythematous macules with underlying dyspigmentation - Recommend daily broad spectrum sunscreen SPF 30+ to sun-exposed areas, reapply every 2 hours as needed.  - Recommend staying in the shade or wearing long sleeves, sun glasses (UVA+UVB protection) and wide brim hats (4-inch brim around the entire circumference of the hat). - Call for new or changing lesions.  Return for Surgery, as scheduled.  Graciella Belton, RMA, am acting as scribe for Sarina Ser, MD . Documentation: I have reviewed the above documentation for accuracy and completeness, and I agree with the above.  Sarina Ser, MD

## 2021-04-11 ENCOUNTER — Other Ambulatory Visit: Payer: Self-pay | Admitting: Internal Medicine

## 2021-04-12 ENCOUNTER — Encounter: Payer: Self-pay | Admitting: Dermatology

## 2021-07-29 ENCOUNTER — Ambulatory Visit (INDEPENDENT_AMBULATORY_CARE_PROVIDER_SITE_OTHER): Payer: Self-pay | Admitting: Internal Medicine

## 2021-07-29 ENCOUNTER — Encounter: Payer: Self-pay | Admitting: Internal Medicine

## 2021-07-29 ENCOUNTER — Other Ambulatory Visit: Payer: Self-pay

## 2021-07-29 VITALS — BP 120/82 | HR 76 | Temp 96.0°F | Ht 69.02 in | Wt 183.4 lb

## 2021-07-29 DIAGNOSIS — D72821 Monocytosis (symptomatic): Secondary | ICD-10-CM

## 2021-07-29 DIAGNOSIS — R634 Abnormal weight loss: Secondary | ICD-10-CM | POA: Insufficient documentation

## 2021-07-29 DIAGNOSIS — I10 Essential (primary) hypertension: Secondary | ICD-10-CM

## 2021-07-29 DIAGNOSIS — F5105 Insomnia due to other mental disorder: Secondary | ICD-10-CM

## 2021-07-29 DIAGNOSIS — Z1211 Encounter for screening for malignant neoplasm of colon: Secondary | ICD-10-CM

## 2021-07-29 DIAGNOSIS — F409 Phobic anxiety disorder, unspecified: Secondary | ICD-10-CM | POA: Insufficient documentation

## 2021-07-29 DIAGNOSIS — R7303 Prediabetes: Secondary | ICD-10-CM

## 2021-07-29 DIAGNOSIS — Z716 Tobacco abuse counseling: Secondary | ICD-10-CM

## 2021-07-29 DIAGNOSIS — E039 Hypothyroidism, unspecified: Secondary | ICD-10-CM

## 2021-07-29 LAB — CBC WITH DIFFERENTIAL/PLATELET
Basophils Absolute: 0.1 10*3/uL (ref 0.0–0.1)
Basophils Relative: 0.8 % (ref 0.0–3.0)
Eosinophils Absolute: 0.2 10*3/uL (ref 0.0–0.7)
Eosinophils Relative: 3.1 % (ref 0.0–5.0)
HCT: 43.2 % (ref 39.0–52.0)
Hemoglobin: 14.4 g/dL (ref 13.0–17.0)
Lymphocytes Relative: 18.3 % (ref 12.0–46.0)
Lymphs Abs: 1.2 10*3/uL (ref 0.7–4.0)
MCHC: 33.4 g/dL (ref 30.0–36.0)
MCV: 101.8 fl — ABNORMAL HIGH (ref 78.0–100.0)
Monocytes Absolute: 0.9 10*3/uL (ref 0.1–1.0)
Monocytes Relative: 14.1 % — ABNORMAL HIGH (ref 3.0–12.0)
Neutro Abs: 4.1 10*3/uL (ref 1.4–7.7)
Neutrophils Relative %: 63.7 % (ref 43.0–77.0)
Platelets: 201 10*3/uL (ref 150.0–400.0)
RBC: 4.25 Mil/uL (ref 4.22–5.81)
RDW: 13.1 % (ref 11.5–15.5)
WBC: 6.4 10*3/uL (ref 4.0–10.5)

## 2021-07-29 LAB — COMPREHENSIVE METABOLIC PANEL
ALT: 23 U/L (ref 0–53)
AST: 21 U/L (ref 0–37)
Albumin: 4.3 g/dL (ref 3.5–5.2)
Alkaline Phosphatase: 48 U/L (ref 39–117)
BUN: 17 mg/dL (ref 6–23)
CO2: 28 mEq/L (ref 19–32)
Calcium: 9.2 mg/dL (ref 8.4–10.5)
Chloride: 103 mEq/L (ref 96–112)
Creatinine, Ser: 1.15 mg/dL (ref 0.40–1.50)
GFR: 71.24 mL/min (ref >60.00)
Glucose, Bld: 82 mg/dL (ref 70–99)
Potassium: 4.2 mEq/L (ref 3.5–5.1)
Sodium: 140 mEq/L (ref 135–145)
Total Bilirubin: 0.8 mg/dL (ref 0.2–1.2)
Total Protein: 6.3 g/dL (ref 6.0–8.3)

## 2021-07-29 LAB — TSH: TSH: 2.22 u[IU]/mL (ref 0.35–5.50)

## 2021-07-29 LAB — HEMOGLOBIN A1C: Hgb A1c MFr Bld: 5.8 % (ref 4.6–6.5)

## 2021-07-29 NOTE — Assessment & Plan Note (Signed)
He has a chronic cough.  He declines lung CT.  Reduction /quitting recommended

## 2021-07-29 NOTE — Progress Notes (Signed)
Subjective:  Patient ID: Tim Owen, male    DOB: 01-03-1966  Age: 55 y.o. MRN: [id number]  CC: The primary encounter diagnosis was Hypothyroidism (acquired). Diagnoses of Prediabetes, Monocytosis, Colon cancer screening, Essential hypertension, Unintentional weight loss, Insomnia due to anxiety and fear, and Tobacco abuse counseling were also pertinent to this visit.  HPI Tim Owen presents for follow up n hypertension, hypothyroidism and RLS   1) Hypertension: patient checks blood pressure twice weekly at home.  Readings have been < 120/70 at rest . Patient is following a reduced salt diet most days and is taking losartan  as prescribed .   2) Insomnia:  "I work in my sleep"  also woken up by  Leg cramps 2 times per week ,  uses pickle juice    3) unintentional weight loss:  behind in colon ca screening,  no prior Lung CT  smokes  1 pack daily .  CT scan recommended but deferred.  "do I really want to know?' Willing to do cologuard    Outpatient Medications Prior to Visit  Medication Sig Dispense Refill   esomeprazole (NEXIUM) 20 MG packet Take 20 mg by mouth daily before breakfast.     loratadine (CLARITIN) 10 MG tablet Take 10 mg by mouth daily.     losartan (COZAAR) 50 MG tablet TAKE 1 TABLET BY MOUTH DAILY 90 tablet 1   SYNTHROID 137 MCG tablet TAKE 1 TABLET EVERY DAY ON EMPTY STOMACHWITH A GLASS OF WATER AT LEAST 30-60 MINBEFORE BREAKFAST 90 tablet 1   Ascorbic Acid (VITAMIN C) 1000 MG tablet Take 1,000 mg by mouth daily. (Patient not taking: Reported on 07/29/2021)     benzonatate (TESSALON PERLES) 100 MG capsule Take 1 capsule (100 mg total) by mouth 3 (three) times daily as needed. (Patient not taking: Reported on 07/29/2021) 20 capsule 0   Cholecalciferol (VITAMIN D3) 50 MCG (2000 UT) CAPS Take 1 capsule by mouth daily. (Patient not taking: Reported on 07/29/2021)     fluticasone (FLONASE) 50 MCG/ACT nasal spray Place 2 sprays into both nostrils daily. (Patient not  taking: Reported on 07/29/2021) 16 g 6   zinc gluconate 50 MG tablet Take 50 mg by mouth daily. (Patient not taking: Reported on 07/29/2021)     Facility-Administered Medications Prior to Visit  Medication Dose Route Frequency Provider Last Rate Last Admin   lidocaine (XYLOCAINE) 2 % (with pres) injection 30 mg  1.5 mL Other Once Crecencio Mc, MD       triamcinolone acetonide (KENALOG-40) injection 20 mg  20 mg Intra-articular Once Crecencio Mc, MD        Review of Systems;  Patient denies headache, fevers, malaise,  skin rash, eye pain, sinus congestion and sinus pain, sore throat, dysphagia,  hemoptysis , , dyspnea, wheezing, chest pain, palpitations, orthopnea, edema, abdominal pain, nausea, melena, diarrhea, constipation, flank pain, dysuria, hematuria, urinary  Frequency, nocturia, numbness, tingling, seizures,  Focal weakness, Loss of consciousness,  Tremor, insomnia, depression, anxiety, and suicidal ideation.      Objective:  BP 120/82   Pulse 76   Temp (!) 96 F (35.6 C)   Ht 5' 9.02" (1.753 m)   Wt 183 lb 6.4 oz (83.2 kg)   SpO2 95%   BMI 27.07 kg/m   BP Readings from Last 3 Encounters:  07/29/21 120/82  01/26/21 98/62  10/27/20 (!) 108/95    Wt Readings from Last 3 Encounters:  07/29/21 183 lb 6.4 oz (83.2 kg)  01/26/21 188 lb (85.3 kg)  11/09/20 190 lb (86.2 kg)    General appearance: alert, cooperative and appears stated age Ears: normal TM's and external ear canals both ears Throat: lips, mucosa, and tongue normal; teeth and gums normal Neck: no adenopathy, no carotid bruit, supple, symmetrical, trachea midline and thyroid not enlarged, symmetric, no tenderness/mass/nodules Back: symmetric, no curvature. ROM normal. No CVA tenderness. Lungs: clear to auscultation bilaterally Heart: regular rate and rhythm, S1, S2 normal, no murmur, click, rub or gallop Abdomen: soft, non-tender; bowel sounds normal; no masses,  no organomegaly Pulses: 2+ and  symmetric Skin: Skin color, texture, turgor normal. No rashes or lesions Lymph nodes: Cervical, supraclavicular, and axillary nodes normal.  Lab Results  Component Value Date   HGBA1C 5.7 01/26/2020   HGBA1C 5.9 03/25/2019   HGBA1C 5.7 08/20/2012    Lab Results  Component Value Date   CREATININE 1.10 01/26/2021   CREATININE 1.25 01/26/2020   CREATININE 1.22 09/23/2019    Lab Results  Component Value Date   WBC 6.2 01/26/2020   HGB 15.6 01/26/2020   HCT 45.0 01/26/2020   PLT 235.0 01/26/2020   GLUCOSE 98 01/26/2021   CHOL 174 01/26/2021   TRIG 63.0 01/26/2021   HDL 50.90 01/26/2021   LDLDIRECT 145.0 12/03/2015   LDLCALC 110 (H) 01/26/2021   ALT 23 01/26/2021   AST 19 01/26/2021   NA 139 01/26/2021   K 4.6 01/26/2021   CL 103 01/26/2021   CREATININE 1.10 01/26/2021   BUN 19 01/26/2021   CO2 26 01/26/2021   TSH 2.19 01/26/2021   PSA 0.61 01/26/2021   HGBA1C 5.7 01/26/2020   MICROALBUR 2.5 (H) 02/09/2017    No results found.  Assessment & Plan:   Problem List Items Addressed This Visit       Unprioritized   Essential hypertension    Well controlled on current regimen of losartan 50 mg daily . Renal function due, no changes today.      Hypothyroidism (acquired) - Primary    Checking tsh today       Relevant Orders   TSH   Insomnia due to anxiety and fear    /Patient  has been having some trouble sleeping.  Wakes up 2 to 3 times per night . Bedtime hygiene reviewed,  Patient has been using an electronic book to read before bedd and drinks Diet PEPsi all night long .  Advised to switch to  caffeinated beverages after 3 PM.  Has a snoring wife. Does not drink alcohol to excess.  Not exercising excessively in the evening.  Bedtime hygiene measures given,  He declines trial of trazodone       Monocytosis   Relevant Orders   CBC with Differential/Platelet   Prediabetes   Relevant Orders   Comprehensive metabolic panel   Hemoglobin A1c   Tobacco abuse  counseling    He has a chronic cough.  He declines lung CT.  Reduction /quitting recommended       Unintentional weight loss    Screening for progression to diabetes,  fllow up on hypothyorryoidism treatment.  Screening for occult malignancy discussed at length as he is behing on colon CA screen and is a lifelong smoker.  He declines lung CT but agrees to have cologuard       Other Visit Diagnoses     Colon cancer screening       Relevant Orders   Cologuard      I provided  30 minutes  of  face-to-face time during this encounter reviewing patient's current problems and past surgeries, labs and imaging studies, providing counseling on the above mentioned problems , and coordination  of care .  Follow-up: Return in about 6 months (around 01/26/2022).   Crecencio Mc, MD

## 2021-07-29 NOTE — Patient Instructions (Signed)
  I will initiate the order for your olon cancer screening  Test.  It is called  Cologuard.  It will be delivered to your house, and you will send off a stool sample in the envelope it provides.     Please try to reduce your daily cigarette use and consider getting the Lung CT for lung cancer screening   Your blood pressure is well controlled. You can have an oucne of pickle juice daily to PREVENT leg cramps

## 2021-07-29 NOTE — Assessment & Plan Note (Signed)
Screening for progression to diabetes,  fllow up on hypothyorryoidism treatment.  Screening for occult malignancy discussed at length as he is behing on colon CA screen and is a lifelong smoker.  He declines lung CT but agrees to have cologuard

## 2021-07-29 NOTE — Assessment & Plan Note (Signed)
Well controlled on current regimen of losartan 50 mg daily . Renal function due, no changes today.

## 2021-07-29 NOTE — Assessment & Plan Note (Signed)
/  Patient  has been having some trouble sleeping.  Wakes up 2 to 3 times per night . Bedtime hygiene reviewed,  Patient has been using an electronic book to read before bedd and drinks Diet PEPsi all night long .  Advised to switch to  caffeinated beverages after 3 PM.  Has a snoring wife. Does not drink alcohol to excess.  Not exercising excessively in the evening.  Bedtime hygiene measures given,  He declines trial of trazodone

## 2021-07-29 NOTE — Assessment & Plan Note (Signed)
Checking tsh today

## 2021-08-20 LAB — COLOGUARD: Cologuard: NEGATIVE

## 2021-08-23 ENCOUNTER — Telehealth: Payer: Self-pay

## 2021-08-23 ENCOUNTER — Other Ambulatory Visit: Payer: Self-pay

## 2021-08-23 ENCOUNTER — Ambulatory Visit (INDEPENDENT_AMBULATORY_CARE_PROVIDER_SITE_OTHER): Payer: Self-pay | Admitting: Dermatology

## 2021-08-23 DIAGNOSIS — L720 Epidermal cyst: Secondary | ICD-10-CM

## 2021-08-23 DIAGNOSIS — D492 Neoplasm of unspecified behavior of bone, soft tissue, and skin: Secondary | ICD-10-CM

## 2021-08-23 MED ORDER — MUPIROCIN 2 % EX OINT: 1.0000 "application " | TOPICAL_OINTMENT | Freq: Every day | CUTANEOUS | 1 refills | Status: DC

## 2021-08-23 NOTE — Progress Notes (Signed)
   Follow-Up Visit   Subjective  Tim Owen is a 55 y.o. male who presents for the following: Cyst (L lower back, pt presents for excision).  Patient accompanied by wife who contributes to history.  The following portions of the chart were reviewed this encounter and updated as appropriate:   Tobacco  Allergies  Meds  Problems  Med Hx  Surg Hx  Fam Hx     Review of Systems:  No other skin or systemic complaints except as noted in HPI or Assessment and Plan.  Objective  Well appearing patient in no apparent distress; mood and affect are within normal limits.  A focused examination was performed including back. Relevant physical exam findings are noted in the Assessment and Plan.  Left Lower Back Cystic pap 4.1cm   Assessment & Plan  Neoplasm of skin Left Lower Back  mupirocin ointment (BACTROBAN) 2 % Apply 1 application topically daily. Qd to excision site  Skin excision  Lesion length (cm):  4.1 Lesion width (cm):  4.1 Margin per side (cm):  0 Total excision diameter (cm):  4.1 Informed consent: discussed and consent obtained   Timeout: patient name, date of birth, surgical site, and procedure verified   Procedure prep:  Patient was prepped and draped in usual sterile fashion Prep type:  Isopropyl alcohol and povidone-iodine Anesthesia: the lesion was anesthetized in a standard fashion   Anesthetic:  1% lidocaine w/ epinephrine 1-100,000 buffered w/ 8.4% NaHCO3 (6cc lido w/ epi, 3cc bupivicaine, Total = 9cc) Instrument used: #15 blade   Hemostasis achieved with: pressure   Hemostasis achieved with comment:  Electrocautery Outcome: patient tolerated procedure well with no complications   Post-procedure details: sterile dressing applied and wound care instructions given   Dressing type: pressure dressing and bacitracin (Mupirocin)    Skin repair Complexity:  Complex Final length (cm):  4 Reason for type of repair: reduce tension to allow closure, reduce  the risk of dehiscence, infection, and necrosis, reduce subcutaneous dead space and avoid a hematoma, allow closure of the large defect, preserve normal anatomy, preserve normal anatomical and functional relationships and enhance both functionality and cosmetic results   Undermining: area extensively undermined   Undermining comment:  Undermining Defect 4.1cm Subcutaneous layers (deep stitches):  Suture size:  2-0 Suture type: Vicryl (polyglactin 910)   Subcutaneous suture technique: Inverted Dermal. Fine/surface layer approximation (top stitches):  Suture size:  3-0 Suture type: nylon   Stitches: horizontal mattress   Suture removal (days):  7 Hemostasis achieved with: pressure Outcome: patient tolerated procedure well with no complications   Post-procedure details: sterile dressing applied and wound care instructions given   Dressing type: pressure dressing and bacitracin (Mupirocin)    Specimen 1 - Surgical pathology Differential Diagnosis: D48.5 Cyst vs other  Check Margins: No Cystic pap 4.1cm  Cyst vs other  Start Mupirocin oint qd to excision site  Return in about 1 week (around 08/30/2021) for suture removal.  I, Othelia Pulling, RMA, am acting as scribe for Sarina Ser, MD . Documentation: I have reviewed the above documentation for accuracy and completeness, and I agree with the above.  Sarina Ser, MD

## 2021-08-23 NOTE — Telephone Encounter (Signed)
Patient doing fine after today's surgery./sh 

## 2021-08-23 NOTE — Patient Instructions (Signed)
Wound Care Instructions  Cleanse wound gently with soap and water once a day then pat dry with clean gauze. Apply a thing coat of Petrolatum (petroleum jelly, "Vaseline") over the wound (unless you have an allergy to this). We recommend that you use a new, sterile tube of Vaseline. Do not pick or remove scabs. Do not remove the yellow or white "healing tissue" from the base of the wound.  Cover the wound with fresh, clean, nonstick gauze and secure with paper tape. You may use Band-Aids in place of gauze and tape if the would is small enough, but would recommend trimming much of the tape off as there is often too much. Sometimes Band-Aids can irritate the skin.  You should call the office for your biopsy report after 1 week if you have not already been contacted.  If you experience any problems, such as abnormal amounts of bleeding, swelling, significant bruising, significant pain, or evidence of infection, please call the office immediately.  FOR ADULT SURGERY PATIENTS: If you need something for pain relief you may take 1 extra strength Tylenol (acetaminophen) AND 2 Ibuprofen (200mg each) together every 4 hours as needed for pain. (do not take these if you are allergic to them or if you have a reason you should not take them.) Typically, you may only need pain medication for 1 to 3 days.   If you have any questions or concerns for your doctor, please call our main line at [phone] and press option 4 to reach your doctor's medical assistant. If no one answers, please leave a voicemail as directed and we will return your call as soon as possible. Messages left after 4 pm will be answered the following business day.   You may also send us a message via MyChart. We typically respond to MyChart messages within 1-2 business days.  For prescription refills, please ask your pharmacy to contact our office. Our fax number is [phone].  If you have an urgent issue when the clinic is closed that  cannot wait until the next business day, you can page your doctor at the number below.    Please note that while we do our best to be available for urgent issues outside of office hours, we are not available 24/7.   If you have an urgent issue and are unable to reach us, you may choose to seek medical care at your doctor's office, retail clinic, urgent care center, or emergency room.  If you have a medical emergency, please immediately call 911 or go to the emergency department.  Pager Numbers  - Dr. Kowalski: [phone]  - Dr. Moye: [phone]  - Dr. Stewart: [phone]  In the event of inclement weather, please call our main line at [phone] for an update on the status of any delays or closures.  Dermatology Medication Tips: Please keep the boxes that topical medications come in in order to help keep track of the instructions about where and how to use these. Pharmacies typically print the medication instructions only on the boxes and not directly on the medication tubes.   If your medication is too expensive, please contact our office at [phone] option 4 or send us a message through MyChart.   We are unable to tell what your co-pay for medications will be in advance as this is different depending on your insurance coverage. However, we may be able to find a substitute medication at lower cost or fill out paperwork to get insurance to cover a needed   medication.   If a prior authorization is required to get your medication covered by your insurance company, please allow us 1-2 business days to complete this process.  Drug prices often vary depending on where the prescription is filled and some pharmacies may offer cheaper prices.  The website www.goodrx.com contains coupons for medications through different pharmacies. The prices here do not account for what the cost may be with help from insurance (it may be cheaper with your insurance), but the website can give you the  price if you did not use any insurance.  - You can print the associated coupon and take it with your prescription to the pharmacy.  - You may also stop by our office during regular business hours and pick up a GoodRx coupon card.  - If you need your prescription sent electronically to a different pharmacy, notify our office through [facility] MyChart or by phone at [phone] option 4.   

## 2021-08-24 ENCOUNTER — Ambulatory Visit: Payer: Self-pay | Admitting: Dermatology

## 2021-08-24 ENCOUNTER — Encounter: Payer: Self-pay | Admitting: Dermatology

## 2021-08-30 ENCOUNTER — Ambulatory Visit (INDEPENDENT_AMBULATORY_CARE_PROVIDER_SITE_OTHER): Payer: Self-pay | Admitting: Dermatology

## 2021-08-30 ENCOUNTER — Other Ambulatory Visit: Payer: Self-pay

## 2021-08-30 ENCOUNTER — Encounter: Payer: Self-pay | Admitting: Dermatology

## 2021-08-30 DIAGNOSIS — L720 Epidermal cyst: Secondary | ICD-10-CM

## 2021-08-30 NOTE — Patient Instructions (Signed)

## 2021-08-30 NOTE — Progress Notes (Signed)
   Follow-Up Visit   Subjective  Tim Owen is a 55 y.o. male who presents for the following: suture removal (L low back - pathology proven benign cyst, patient is here today for suture removal).  The following portions of the chart were reviewed this encounter and updated as appropriate:   Tobacco  Allergies  Meds  Problems  Med Hx  Surg Hx  Fam Hx     Review of Systems:  No other skin or systemic complaints except as noted in HPI or Assessment and Plan.  Objective  Well appearing patient in no apparent distress; mood and affect are within normal limits.  A focused examination was performed including the back. Relevant physical exam findings are noted in the Assessment and Plan.  L low back Healing excision site.   Assessment & Plan  Epidermal inclusion cyst L low back  Encounter for Removal of Sutures - Incision site at the left low back is clean, dry and intact - Wound cleansed, sutures removed, wound cleansed and steri strips applied.  - Discussed pathology results showing a benign cyst.  - Patient advised to keep steri-strips dry until they fall off. - Scars remodel for a full year. - Once steri-strips fall off, patient can apply over-the-counter silicone scar cream each night to help with scar remodeling if desired. - Patient advised to call with any concerns or if they notice any new or changing lesions.  Return in about 7 months (around 03/30/2022) for AK f/u, sun exposed areas.  Tim Owen, CMA, am acting as scribe for Sarina Ser, MD . Documentation: I have reviewed the above documentation for accuracy and completeness, and I agree with the above.  Sarina Ser, MD

## 2021-10-10 ENCOUNTER — Other Ambulatory Visit: Payer: Self-pay | Admitting: Internal Medicine

## 2021-11-05 ENCOUNTER — Other Ambulatory Visit: Payer: Self-pay | Admitting: Internal Medicine

## 2022-01-02 ENCOUNTER — Other Ambulatory Visit: Payer: Self-pay

## 2022-01-02 ENCOUNTER — Ambulatory Visit (INDEPENDENT_AMBULATORY_CARE_PROVIDER_SITE_OTHER): Payer: Self-pay | Admitting: Internal Medicine

## 2022-01-02 ENCOUNTER — Encounter: Payer: Self-pay | Admitting: Internal Medicine

## 2022-01-02 VITALS — BP 140/76 | HR 66 | Temp 97.5°F | Ht 69.0 in | Wt 181.0 lb

## 2022-01-02 DIAGNOSIS — E785 Hyperlipidemia, unspecified: Secondary | ICD-10-CM

## 2022-01-02 DIAGNOSIS — I10 Essential (primary) hypertension: Secondary | ICD-10-CM

## 2022-01-02 DIAGNOSIS — M2550 Pain in unspecified joint: Secondary | ICD-10-CM | POA: Insufficient documentation

## 2022-01-02 DIAGNOSIS — M130 Polyarthritis, unspecified: Secondary | ICD-10-CM

## 2022-01-02 DIAGNOSIS — E039 Hypothyroidism, unspecified: Secondary | ICD-10-CM

## 2022-01-02 LAB — SEDIMENTATION RATE: Sed Rate: 24 mm/hr — ABNORMAL HIGH (ref 0–20)

## 2022-01-02 LAB — TSH: TSH: 4.3 u[IU]/mL (ref 0.35–5.50)

## 2022-01-02 LAB — COMPREHENSIVE METABOLIC PANEL
ALT: 13 U/L (ref 0–53)
AST: 13 U/L (ref 0–37)
Albumin: 4.5 g/dL (ref 3.5–5.2)
Alkaline Phosphatase: 55 U/L (ref 39–117)
BUN: 17 mg/dL (ref 6–23)
CO2: 29 mEq/L (ref 19–32)
Calcium: 9.7 mg/dL (ref 8.4–10.5)
Chloride: 102 mEq/L (ref 96–112)
Creatinine, Ser: 1.08 mg/dL (ref 0.40–1.50)
GFR: 76.58 mL/min (ref >60.00)
Glucose, Bld: 92 mg/dL (ref 70–99)
Potassium: 4.6 mEq/L (ref 3.5–5.1)
Sodium: 140 mEq/L (ref 135–145)
Total Bilirubin: 0.6 mg/dL (ref 0.2–1.2)
Total Protein: 6.7 g/dL (ref 6.0–8.3)

## 2022-01-02 LAB — C-REACTIVE PROTEIN: CRP: 3 mg/dL (ref 0.5–20.0)

## 2022-01-02 NOTE — Assessment & Plan Note (Signed)
Involving hips, pelvis and knees.  Checking ESR,CRP ?Advised to combine NSAID (aleve) with tylenol 12 hours and  Resume PPI  ?

## 2022-01-02 NOTE — Assessment & Plan Note (Signed)
Well controlled on LOSARTAN  Based on home readings .  Renal function stable, no changes today. ?

## 2022-01-02 NOTE — Assessment & Plan Note (Signed)
Managed with levothyroxine ? ?Lab Results  ?Component Value Date  ? TSH 2.22 07/29/2021  ? ? ? ? ?

## 2022-01-02 NOTE — Patient Instructions (Addendum)
Your joint pain responded to steroids because all joint pain regardless of cause responds to steroid ? ?Ok to use up to 2 Aleve every 12 hours AND  ?You can add up to 2000 mg of acetominophen (tylenol) every day safely  In divided doses (500 mg every 6 hours  Or 1000 mg every 12 hours.) ? ?Resume daily Nexium to prevent NSAID induced gastritis  VERY IMPORTANT ? ?Is ESR or CRP are elevated,  further workup for autoimmune forms of inflammatory arthritis will be recommended ? ? ? ? ?

## 2022-01-02 NOTE — Progress Notes (Signed)
? ?Subjective:  ?Patient ID: Tim Owen, male    DOB: [birth year]  Age: 56 y.o. MRN: [id number] ? ?CC: The primary encounter diagnosis was Polyarthralgia. Diagnoses of Hypothyroidism (acquired), Essential hypertension, and Hyperlipidemia LDL goal <100 were also pertinent to this visit. ? ? ?This visit occurred during the SARS-CoV-2 public health emergency.  Safety protocols were in place, including screening questions prior to the visit, additional usage of staff PPE, and extensive cleaning of exam room while observing appropriate contact time as indicated for disinfecting solutions.   ? ?HPI ?Tim Owen presents for  ?Chief Complaint  ?Patient presents with  ? Follow-up  ?  72mof/u - Hypothyroidism, hypertension. Pt also complaining of joint pain. Pt states hips, knees, ankles and shoulders hurting. [facility] told he has plantar fascitis. Refuses to take rx for pain. Seen by Dr. KJens Som given cortisone shot. Slight relief.  ? ?1) HTN:  Patient is taking his medications as prescribed and notes no adverse effects.  Home BP readings have been done about once per week and are  generally < 120/80 , even with use of NSAIDs .  He is avoiding added salt in his diet and walking regularly about 3 times per week for exercise  .  ? ?2) Joint pain:  left knee,  right hip , became more noticeable after the steroid injection Dr CCleda Mccreedygave him 3 weeks ago wore off.  History of arthroscopy of left knee. Using aleve once daily  ? ?3) Hypothyroidism: taking Synthroid 137 mcg daily.  ? ?Outpatient Medications Prior to Visit  ?Medication Sig Dispense Refill  ? losartan (COZAAR) 50 MG tablet TAKE 1 TABLET BY MOUTH DAILY. 90 tablet 0  ? SYNTHROID 137 MCG tablet TAKE 1 TABLET EVERY DAY ON EMPTY STOMACHWITH A GLASS OF WATER AT LEAST 30-60 MINBEFORE BREAKFAST 90 tablet 1  ? Ascorbic Acid (VITAMIN C) 1000 MG tablet Take 1,000 mg by mouth daily. (Patient not taking: Reported on 01/02/2022)    ? benzonatate (TESSALON PERLES)  100 MG capsule Take 1 capsule (100 mg total) by mouth 3 (three) times daily as needed. (Patient not taking: Reported on 01/02/2022) 20 capsule 0  ? Cholecalciferol (VITAMIN D3) 50 MCG (2000 UT) CAPS Take 1 capsule by mouth daily. (Patient not taking: Reported on 01/02/2022)    ? esomeprazole (NEXIUM) 20 MG packet Take 20 mg by mouth daily before breakfast. (Patient not taking: Reported on 01/02/2022)    ? fluticasone (FLONASE) 50 MCG/ACT nasal spray Place 2 sprays into both nostrils daily. (Patient not taking: Reported on 01/02/2022) 16 g 6  ? loratadine (CLARITIN) 10 MG tablet Take 10 mg by mouth daily. (Patient not taking: Reported on 01/02/2022)    ? mupirocin ointment (BACTROBAN) 2 % Apply 1 application topically daily. Qd to excision site (Patient not taking: Reported on 01/02/2022) 22 g 1  ? zinc gluconate 50 MG tablet Take 50 mg by mouth daily. (Patient not taking: Reported on 01/02/2022)    ? ?Facility-Administered Medications Prior to Visit  ?Medication Dose Route Frequency Provider Last Rate Last Admin  ? lidocaine (XYLOCAINE) 2 % (with pres) injection 30 mg  1.5 mL Other Once TCrecencio Mc MD      ? triamcinolone acetonide (KENALOG-40) injection 20 mg  20 mg Intra-articular Once TCrecencio Mc MD      ? ? ?Review of Systems; ? ?Patient denies headache, fevers, malaise, unintentional weight loss, skin rash, eye pain, sinus congestion and sinus pain, sore throat,  dysphagia,  hemoptysis , cough, dyspnea, wheezing, chest pain, palpitations, orthopnea, edema, abdominal pain, nausea, melena, diarrhea, constipation, flank pain, dysuria, hematuria, urinary  Frequency, nocturia, numbness, tingling, seizures,  Focal weakness, Loss of consciousness,  Tremor, insomnia, depression, anxiety, and suicidal ideation.   ? ? ? ?Objective:  ?BP 140/76 (BP Location: Right Arm, Patient Position: Sitting, Cuff Size: Large)   Pulse 66   Temp (!) 97.5 ?F (36.4 ?C) (Oral)   Ht 5' 9" (1.753 m)   Wt 181 lb (82.1 kg)   SpO2 98%   BMI  26.73 kg/m?  ? ?BP Readings from Last 3 Encounters:  ?01/02/22 140/76  ?07/29/21 120/82  ?01/26/21 98/62  ? ? ?Wt Readings from Last 3 Encounters:  ?01/02/22 181 lb (82.1 kg)  ?07/29/21 183 lb 6.4 oz (83.2 kg)  ?01/26/21 188 lb (85.3 kg)  ? ? ?General appearance: alert, cooperative and appears stated age ?Ears: normal TM's and external ear canals both ears ?Throat: lips, mucosa, and tongue normal; teeth and gums normal ?Neck: no adenopathy, no carotid bruit, supple, symmetrical, trachea midline and thyroid not enlarged, symmetric, no tenderness/mass/nodules ?Back: symmetric, no curvature. ROM normal. No CVA tenderness. ?Lungs: clear to auscultation bilaterally ?Heart: regular rate and rhythm, S1, S2 normal, no murmur, click, rub or gallop ?Abdomen: soft, non-tender; bowel sounds normal; no masses,  no organomegaly ?Pulses: 2+ and symmetric ?Skin: Skin color, texture, turgor normal. No rashes or lesions ?Ext:  bilateral knee creiputs without warmth,  Right hip with full ROM  ?Lymph nodes: Cervical, supraclavicular, and axillary nodes normal. ? ?Lab Results  ?Component Value Date  ? HGBA1C 5.8 07/29/2021  ? HGBA1C 5.7 01/26/2020  ? HGBA1C 5.9 03/25/2019  ? ? ?Lab Results  ?Component Value Date  ? CREATININE 1.08 01/02/2022  ? CREATININE 1.15 07/29/2021  ? CREATININE 1.10 01/26/2021  ? ? ?Lab Results  ?Component Value Date  ? WBC 6.4 07/29/2021  ? HGB 14.4 07/29/2021  ? HCT 43.2 07/29/2021  ? PLT 201.0 07/29/2021  ? GLUCOSE 92 01/02/2022  ? CHOL 174 01/26/2021  ? TRIG 63.0 01/26/2021  ? HDL 50.90 01/26/2021  ? LDLDIRECT 145.0 12/03/2015  ? LDLCALC 110 (H) 01/26/2021  ? ALT 13 01/02/2022  ? AST 13 01/02/2022  ? NA 140 01/02/2022  ? K 4.6 01/02/2022  ? CL 102 01/02/2022  ? CREATININE 1.08 01/02/2022  ? BUN 17 01/02/2022  ? CO2 29 01/02/2022  ? TSH 4.30 01/02/2022  ? PSA 0.61 01/26/2021  ? HGBA1C 5.8 07/29/2021  ? MICROALBUR 2.5 (H) 02/09/2017  ? ? ?No results found. ? ?Assessment & Plan:  ? ?Problem List Items Addressed  This Visit   ? ? Essential hypertension  ?  Well controlled on LOSARTAN  Based on home readings .  Renal function stable, no changes today. ?  ?  ? Relevant Orders  ? Comprehensive metabolic panel (Completed)  ? Hyperlipidemia LDL goal <100  ? Relevant Orders  ? Lipid Panel w/reflex Direct LDL  ? Hypothyroidism (acquired)  ?  Managed with levothyroxine ? ?Lab Results  ?Component Value Date  ? TSH 2.22 07/29/2021  ? ? ? ? ?  ?  ? Relevant Orders  ? TSH (Completed)  ? Polyarthralgia - Primary  ?  Involving hips, pelvis and knees.  Checking ESR,CRP ?Advised to combine NSAID (aleve) with tylenol 12 hours and  Resume PPI  ?  ?  ? Relevant Orders  ? Sedimentation rate (Completed)  ? C-reactive protein (Completed)  ? ? ?I spent 30  minutes dedicated to the care of this patient on the date of this encounter to include pre-visit review of patient's medical history,  most recent imaging studies, Face-to-face time with the patient , and post visit ordering of testing and therapeutics.   ? ?Follow-up: No follow-ups on file. ? ? ?Crecencio Mc, MD ?

## 2022-01-03 LAB — LIPID PANEL W/REFLEX DIRECT LDL
Cholesterol: 162 mg/dL (ref <200)
HDL: 54 mg/dL (ref >=40)
LDL Cholesterol (Calc): 94 mg/dL (calc)
Non-HDL Cholesterol (Calc): 108 mg/dL (calc) (ref <130)
Total CHOL/HDL Ratio: 3 (calc) (ref <5.0)
Triglycerides: 53 mg/dL (ref <150)

## 2022-01-04 NOTE — Addendum Note (Signed)
Addended by: Crecencio Mc on: 01/04/2022 01:09 PM ? ? Modules accepted: Orders ? ?

## 2022-01-13 ENCOUNTER — Other Ambulatory Visit (INDEPENDENT_AMBULATORY_CARE_PROVIDER_SITE_OTHER): Payer: Self-pay

## 2022-01-13 ENCOUNTER — Other Ambulatory Visit: Payer: Self-pay

## 2022-01-13 DIAGNOSIS — M130 Polyarthritis, unspecified: Secondary | ICD-10-CM

## 2022-01-13 LAB — CBC WITH DIFFERENTIAL/PLATELET
Basophils Absolute: 0.1 10*3/uL (ref 0.0–0.1)
Basophils Relative: 0.7 % (ref 0.0–3.0)
Eosinophils Absolute: 0.1 10*3/uL (ref 0.0–0.7)
Eosinophils Relative: 1.4 % (ref 0.0–5.0)
HCT: 42.7 % (ref 39.0–52.0)
Hemoglobin: 14.3 g/dL (ref 13.0–17.0)
Lymphocytes Relative: 20.1 % (ref 12.0–46.0)
Lymphs Abs: 1.6 10*3/uL (ref 0.7–4.0)
MCHC: 33.4 g/dL (ref 30.0–36.0)
MCV: 99.9 fl (ref 78.0–100.0)
Monocytes Absolute: 0.7 10*3/uL (ref 0.1–1.0)
Monocytes Relative: 9.5 % (ref 3.0–12.0)
Neutro Abs: 5.4 10*3/uL (ref 1.4–7.7)
Neutrophils Relative %: 68.3 % (ref 43.0–77.0)
Platelets: 289 10*3/uL (ref 150.0–400.0)
RBC: 4.28 Mil/uL (ref 4.22–5.81)
RDW: 13.8 % (ref 11.5–15.5)
WBC: 7.8 10*3/uL (ref 4.0–10.5)

## 2022-01-13 LAB — FERRITIN: Ferritin: 160.5 ng/mL (ref 22.0–322.0)

## 2022-01-13 LAB — URIC ACID: Uric Acid, Serum: 7.5 mg/dL (ref 4.0–7.8)

## 2022-01-14 LAB — ANA W/REFLEX IF POSITIVE: Anti Nuclear Antibody (ANA): NEGATIVE

## 2022-01-16 LAB — RHEUMATOID FACTOR: Rheumatoid fact SerPl-aCnc: <14 IU/mL (ref <14)

## 2022-01-16 LAB — HLA-B27 ANTIGEN: HLA-B27 Antigen: NEGATIVE

## 2022-01-17 ENCOUNTER — Encounter: Payer: Self-pay | Admitting: Internal Medicine

## 2022-02-07 ENCOUNTER — Other Ambulatory Visit: Payer: Self-pay | Admitting: Internal Medicine

## 2022-02-24 ENCOUNTER — Encounter: Payer: Self-pay | Admitting: Internal Medicine

## 2022-02-24 ENCOUNTER — Ambulatory Visit (INDEPENDENT_AMBULATORY_CARE_PROVIDER_SITE_OTHER): Payer: Self-pay | Admitting: Internal Medicine

## 2022-02-24 VITALS — BP 130/76 | HR 98 | Temp 98.2°F | Ht 69.0 in | Wt 190.0 lb

## 2022-02-24 DIAGNOSIS — R252 Cramp and spasm: Secondary | ICD-10-CM

## 2022-02-24 DIAGNOSIS — J0100 Acute maxillary sinusitis, unspecified: Secondary | ICD-10-CM

## 2022-02-24 DIAGNOSIS — J329 Chronic sinusitis, unspecified: Secondary | ICD-10-CM | POA: Insufficient documentation

## 2022-02-24 MED ORDER — AMOXICILLIN-POT CLAVULANATE 875-125 MG PO TABS
1.0000 | ORAL_TABLET | Freq: Two times a day (BID) | ORAL | 0 refills | Status: DC
Start: 2022-02-24 — End: 2022-03-23

## 2022-02-24 MED ORDER — TRAMADOL HCL 50 MG PO TABS: 50.0000 mg | ORAL_TABLET | Freq: Four times a day (QID) | ORAL | 0 refills | Status: AC | PRN

## 2022-02-24 MED ORDER — TIZANIDINE HCL 4 MG PO TABS: 4.0000 mg | ORAL_TABLET | Freq: Four times a day (QID) | ORAL | 0 refills | Status: DC | PRN

## 2022-02-24 NOTE — Assessment & Plan Note (Signed)
augmentin x 1 week.  ?

## 2022-02-24 NOTE — Patient Instructions (Addendum)
Start tramadol 50 mg every 6 hours as needed ? ?Use the tizanidine every 6 hours  if needed  ? ?Can be combined with  tylenol (1000 mg every 12 hours)  and ibuprofen 400  mg every 12 hours)  ? ? ?Augmentin twice daily for the sinusitis ? ? ? ? ? ? ? ?

## 2022-02-24 NOTE — Addendum Note (Signed)
Addended by: Crecencio Mc on: 02/24/2022 05:21 PM ? ? Modules accepted: Orders ? ?

## 2022-02-24 NOTE — Progress Notes (Addendum)
? ?Subjective:  ?Patient ID: Tim Owen, male    DOB: 1966-02-10  Age: 56 y.o. MRN: [id number] ? ?CC: The primary encounter diagnosis was Muscle cramping. A diagnosis of Acute non-recurrent maxillary sinusitis was also pertinent to this visit. ? ? ?This visit occurred during the SARS-CoV-2 public health emergency.  Safety protocols were in place, including screening questions prior to the visit, additional usage of staff PPE, and extensive cleaning of exam room while observing appropriate contact time as indicated for disinfecting solutions.   ? ?HPI ?Tim Owen presents for  joint pain  progressive recurrent painful muscle cramping of thighs occurring with exertion  ? ? ?1 ) muscle cramping:  severe at times , occurring at rest and with exertion.  Happening all night long ,  at times incapacitating. .  Pain in  shoulders,  knees and hips has become constant  ,  worse in the morning but present 24/7 with mild improvement with ibuprofen in 400 mg dose hurting severely .  Thighs cramping.  Has tried mustard,  pickle juice.   Has stopped drinking his usual 12 pack of sodas for 3 weeks , no change in pain .   Tried meloxicma 15 mg which did not help.  Drinking gatorade,  no artificial sweeteners  no caffeine.    Tried wife's flexeril which  has helped .  Has had to reduce work from 12 hours to 8 due to constant pain.   ? ? ? ?2) sinusitis  present for several days   cough productive of green sputum.  No fever or facial pain  ? ? ? ? ?Outpatient Medications Prior to Visit  ?Medication Sig Dispense Refill  ? esomeprazole (NEXIUM) 20 MG packet Take 20 mg by mouth daily before breakfast.    ? losartan (COZAAR) 50 MG tablet TAKE 1 TABLET BY MOUTH DAILY. 90 tablet 0  ? SYNTHROID 137 MCG tablet TAKE 1 TABLET EVERY DAY ON EMPTY STOMACHWITH A GLASS OF WATER AT LEAST 30-60 MINBEFORE BREAKFAST 90 tablet 1  ? Ascorbic Acid (VITAMIN C) 1000 MG tablet Take 1,000 mg by mouth daily. (Patient not taking: Reported on 01/02/2022)     ? benzonatate (TESSALON PERLES) 100 MG capsule Take 1 capsule (100 mg total) by mouth 3 (three) times daily as needed. (Patient not taking: Reported on 01/02/2022) 20 capsule 0  ? Cholecalciferol (VITAMIN D3) 50 MCG (2000 UT) CAPS Take 1 capsule by mouth daily. (Patient not taking: Reported on 01/02/2022)    ? fluticasone (FLONASE) 50 MCG/ACT nasal spray Place 2 sprays into both nostrils daily. (Patient not taking: Reported on 01/02/2022) 16 g 6  ? loratadine (CLARITIN) 10 MG tablet Take 10 mg by mouth daily. (Patient not taking: Reported on 01/02/2022)    ? mupirocin ointment (BACTROBAN) 2 % Apply 1 application topically daily. Qd to excision site (Patient not taking: Reported on 01/02/2022) 22 g 1  ? zinc gluconate 50 MG tablet Take 50 mg by mouth daily. (Patient not taking: Reported on 01/02/2022)    ? ?Facility-Administered Medications Prior to Visit  ?Medication Dose Route Frequency Provider Last Rate Last Admin  ? lidocaine (XYLOCAINE) 2 % (with pres) injection 30 mg  1.5 mL Other Once Crecencio Mc, MD      ? triamcinolone acetonide (KENALOG-40) injection 20 mg  20 mg Intra-articular Once Crecencio Mc, MD      ? ? ?Review of Systems; ? ?Patient denies headache, fevers, malaise, unintentional weight loss, skin rash, eye pain, sinus  congestion and sinus pain, sore throat, dysphagia,  hemoptysis , cough, dyspnea, wheezing, chest pain, palpitations, orthopnea, edema, abdominal pain, nausea, melena, diarrhea, constipation, flank pain, dysuria, hematuria, urinary  Frequency, nocturia, numbness, tingling, seizures,  Focal weakness, Loss of consciousness,  Tremor, insomnia, depression, anxiety, and suicidal ideation.   ? ? ? ?Objective:  ?BP 130/76 (BP Location: Right Arm, Patient Position: Sitting, Cuff Size: Normal)   Pulse 98   Temp 98.2 ?F (36.8 ?C) (Oral)   Ht '5\' 9"'  (1.753 m)   Wt 190 lb (86.2 kg)   SpO2 96%   BMI 28.06 kg/m?  ? ?BP Readings from Last 3 Encounters:  ?02/24/22 130/76  ?01/02/22 140/76  ?07/29/21  120/82  ? ? ?Wt Readings from Last 3 Encounters:  ?02/24/22 190 lb (86.2 kg)  ?01/02/22 181 lb (82.1 kg)  ?07/29/21 183 lb 6.4 oz (83.2 kg)  ? ? ?General appearance: alert, cooperative and appears stated age ?Ears: normal TM's and external ear canals both ears ?Throat: lips, mucosa, and tongue normal; teeth and gums normal ?Neck: no adenopathy, no carotid bruit, supple, symmetrical, trachea midline and thyroid not enlarged, symmetric, no tenderness/mass/nodules ?Back: symmetric, no curvature. ROM normal. No CVA tenderness. ?Lungs: clear to auscultation bilaterally ?Heart: regular rate and rhythm, S1, S2 normal, no murmur, click, rub or gallop ?Abdomen: soft, non-tender; bowel sounds normal; no masses,  no organomegaly ?Pulses: 2+ and symmetric ?Skin: Skin color, texture, turgor normal. No rashes or lesions ?Lymph nodes: Cervical, supraclavicular, and axillary nodes normal. ? ?Lab Results  ?Component Value Date  ? HGBA1C 5.8 07/29/2021  ? HGBA1C 5.7 01/26/2020  ? HGBA1C 5.9 03/25/2019  ? ? ?Lab Results  ?Component Value Date  ? CREATININE 1.08 01/02/2022  ? CREATININE 1.15 07/29/2021  ? CREATININE 1.10 01/26/2021  ? ? ?Lab Results  ?Component Value Date  ? WBC 7.8 01/13/2022  ? HGB 14.3 01/13/2022  ? HCT 42.7 01/13/2022  ? PLT 289.0 01/13/2022  ? GLUCOSE 92 01/02/2022  ? CHOL 162 01/02/2022  ? TRIG 53 01/02/2022  ? HDL 54 01/02/2022  ? LDLDIRECT 145.0 12/03/2015  ? Stevenson 94 01/02/2022  ? ALT 13 01/02/2022  ? AST 13 01/02/2022  ? NA 140 01/02/2022  ? K 4.6 01/02/2022  ? CL 102 01/02/2022  ? CREATININE 1.08 01/02/2022  ? BUN 17 01/02/2022  ? CO2 29 01/02/2022  ? TSH 4.30 01/02/2022  ? PSA 0.61 01/26/2021  ? HGBA1C 5.8 07/29/2021  ? MICROALBUR 2.5 (H) 02/09/2017  ? ? ?No results found. ? ?Assessment & Plan:  ? ?Problem List Items Addressed This Visit   ? ? Sinusitis  ?  augmentin x 1 week.  ? ?  ?  ? Relevant Medications  ? amoxicillin-clavulanate (AUGMENTIN) 875-125 MG tablet  ? Muscle cramping - Primary  ?  Has not  responded to trials of pickle juice, gatorade , suspension of diet sodas.  Occurring spontaneously.  Rechecking ESR which was mildly elevated last month with otherwise negative rheumatic workup for joint pain.  Consider PMR but may need muscle biopsy before starting prednisone.  Will prescribed tramadol and tizanidine for symptom relief pending review of calcium, mg  Thyroid and muscle enzyme ? ?  ?  ? Relevant Orders  ? Comprehensive metabolic panel  ? CK (Creatine Kinase)  ? Magnesium  ? Thyroid Panel With TSH  ? C-reactive protein  ? Sedimentation rate  ? Ambulatory referral to General Surgery  ? ? ?I spent a total of   minutes with this patient  in a face to face visit on the date of this encounter reviewing  his most recent with his cardiologist on    ,  his diet and eating habits   his blood pressure readings ,  last office visit with me in this visit  in      and post visit ordering of testing and therapeutics.   ? ?Follow-up: No follow-ups on file. ? ? ?Crecencio Mc, MD ?

## 2022-02-24 NOTE — Assessment & Plan Note (Signed)
Has not responded to trials of pickle juice, gatorade , suspension of diet sodas.  Occurring spontaneously.  Rechecking ESR which was mildly elevated last month with otherwise negative rheumatic workup for joint pain.  Consider PMR but may need muscle biopsy before starting prednisone.  Will prescribed tramadol and tizanidine for symptom relief pending review of calcium, mg  Thyroid and muscle enzyme ?

## 2022-02-25 LAB — COMPREHENSIVE METABOLIC PANEL
AG Ratio: 2 (calc) (ref 1.0–2.5)
ALT: 17 U/L (ref 9–46)
AST: 17 U/L (ref 10–35)
Albumin: 4.1 g/dL (ref 3.6–5.1)
Alkaline phosphatase (APISO): 61 U/L (ref 35–144)
BUN: 18 mg/dL (ref 7–25)
CO2: 26 mmol/L (ref 20–32)
Calcium: 9.2 mg/dL (ref 8.6–10.3)
Chloride: 105 mmol/L (ref 98–110)
Creat: 1.15 mg/dL (ref 0.70–1.30)
Globulin: 2.1 g/dL (calc) (ref 1.9–3.7)
Glucose, Bld: 84 mg/dL (ref 65–99)
Potassium: 4.3 mmol/L (ref 3.5–5.3)
Sodium: 140 mmol/L (ref 135–146)
Total Bilirubin: 0.3 mg/dL (ref 0.2–1.2)
Total Protein: 6.2 g/dL (ref 6.1–8.1)

## 2022-02-25 LAB — THYROID PANEL WITH TSH
Free Thyroxine Index: 2.9 (ref 1.4–3.8)
T3 Uptake: 34 % (ref 22–35)
T4, Total: 8.5 ug/dL (ref 4.9–10.5)
TSH: 7.49 mIU/L — ABNORMAL HIGH (ref 0.40–4.50)

## 2022-02-25 LAB — MAGNESIUM: Magnesium: 2 mg/dL (ref 1.5–2.5)

## 2022-02-25 LAB — SEDIMENTATION RATE: Sed Rate: 17 mm/h (ref 0–20)

## 2022-02-25 LAB — C-REACTIVE PROTEIN: CRP: 25.8 mg/L — ABNORMAL HIGH (ref <8.0)

## 2022-02-25 LAB — CK: Total CK: 171 U/L (ref 44–196)

## 2022-02-26 ENCOUNTER — Encounter: Payer: Self-pay | Admitting: Internal Medicine

## 2022-02-26 ENCOUNTER — Other Ambulatory Visit: Payer: Self-pay | Admitting: Internal Medicine

## 2022-02-26 DIAGNOSIS — R252 Cramp and spasm: Secondary | ICD-10-CM

## 2022-02-26 DIAGNOSIS — E039 Hypothyroidism, unspecified: Secondary | ICD-10-CM

## 2022-02-26 MED ORDER — LEVOTHYROXINE SODIUM 150 MCG PO TABS: 150.0000 ug | ORAL_TABLET | Freq: Every day | ORAL | 3 refills | Status: DC

## 2022-02-26 NOTE — Assessment & Plan Note (Signed)
CK AND ESR WERE NORMAL,  BUT CRP WAS QUIT ELEVATED AND THYROID UNDERACTIVE.  MUSCLE BIOPSY RECOMMENDED.  ?

## 2022-02-26 NOTE — Assessment & Plan Note (Signed)
Patient's thyroid function is underactive on current levothyroxine dose of 137  mcg daily.  UNFORTUNATELY  IT WAS JUST FOR 90 DAys.  Will advise patient to  INCREASE DOSE TO 150  mcg daily  ?

## 2022-02-27 MED ORDER — SYNTHROID 150 MCG PO TABS: 150.0000 ug | ORAL_TABLET | Freq: Every day | ORAL | 1 refills | Status: DC

## 2022-02-27 MED ORDER — PREDNISONE 20 MG PO TABS: 20.0000 mg | ORAL_TABLET | Freq: Every day | ORAL | 1 refills | Status: DC

## 2022-02-27 NOTE — Telephone Encounter (Signed)
Plesae call patient and let him no that no biopsy is needed.  Dr Bary Castilla is also aware  (read the mychart message) ?

## 2022-02-27 NOTE — Telephone Encounter (Signed)
Pt is aware of mychart message and stated that he has already scheduled the follow up with Dr. Derrel Nip in 3 weeks.  ?

## 2022-03-23 ENCOUNTER — Ambulatory Visit (INDEPENDENT_AMBULATORY_CARE_PROVIDER_SITE_OTHER): Payer: Self-pay | Admitting: Internal Medicine

## 2022-03-23 ENCOUNTER — Encounter: Payer: Self-pay | Admitting: Internal Medicine

## 2022-03-23 VITALS — BP 126/88 | HR 81 | Temp 97.8°F | Ht 69.0 in | Wt 189.6 lb

## 2022-03-23 DIAGNOSIS — M353 Polymyalgia rheumatica: Secondary | ICD-10-CM

## 2022-03-23 DIAGNOSIS — R252 Cramp and spasm: Secondary | ICD-10-CM

## 2022-03-23 DIAGNOSIS — E039 Hypothyroidism, unspecified: Secondary | ICD-10-CM

## 2022-03-23 DIAGNOSIS — M1990 Unspecified osteoarthritis, unspecified site: Secondary | ICD-10-CM | POA: Insufficient documentation

## 2022-03-23 MED ORDER — TIZANIDINE HCL 4 MG PO TABS: 4.0000 mg | ORAL_TABLET | Freq: Four times a day (QID) | ORAL | 0 refills | Status: DC | PRN

## 2022-03-23 MED ORDER — PREDNISONE 10 MG PO TABS: ORAL_TABLET | ORAL | 0 refills | Status: DC

## 2022-03-23 NOTE — Assessment & Plan Note (Signed)
TSH was elevated last month and dose was increased to 150 mcg on or around May 2  Lab Results  Component Value Date   TSH 7.49 (H) 02/24/2022

## 2022-03-23 NOTE — Progress Notes (Signed)
Subjective:  Patient ID: Tim Owen, male    DOB: [birth year]  Age: 56 y.o. MRN: [id number]  CC: The primary encounter diagnosis was Hypothyroidism (acquired). Diagnoses of PMR (polymyalgia rheumatica) (HCC) and Muscle cramping were also pertinent to this visit.   HPI Tim Owen presents for follow up on diffuse muscle cramping and joint pain  Chief Complaint  Patient presents with   Follow-up    3 week follow up on muscle cramping   Treated with 20 mg prednisone for presumed PMR ( CRP 25)  with excellent results.  Thyroid function was also low and levothyroxine dose was increased . Pain has resolved,  cramps limited to occasional foot cramps   Outpatient Medications Prior to Visit  Medication Sig Dispense Refill   esomeprazole (NEXIUM) 20 MG packet Take 20 mg by mouth daily before breakfast.     losartan (COZAAR) 50 MG tablet TAKE 1 TABLET BY MOUTH DAILY. 90 tablet 0   SYNTHROID 150 MCG tablet Take 1 tablet (150 mcg total) by mouth daily before breakfast. 90 tablet 1   predniSONE (DELTASONE) 20 MG tablet Take 1 tablet (20 mg total) by mouth daily with breakfast. 30 tablet 1   tiZANidine (ZANAFLEX) 4 MG tablet Take 1 tablet (4 mg total) by mouth every 6 (six) hours as needed for muscle spasms. 30 tablet 0   amoxicillin-clavulanate (AUGMENTIN) 875-125 MG tablet Take 1 tablet by mouth 2 (two) times daily. (Patient not taking: Reported on 03/23/2022) 14 tablet 0   Facility-Administered Medications Prior to Visit  Medication Dose Route Frequency Provider Last Rate Last Admin   lidocaine (XYLOCAINE) 2 % (with pres) injection 30 mg  1.5 mL Other Once Crecencio Mc, MD       triamcinolone acetonide (KENALOG-40) injection 20 mg  20 mg Intra-articular Once Crecencio Mc, MD        Review of Systems;  Patient denies headache, fevers, malaise, unintentional weight loss, skin rash, eye pain, sinus congestion and sinus pain, sore throat, dysphagia,  hemoptysis , cough, dyspnea,  wheezing, chest pain, palpitations, orthopnea, edema, abdominal pain, nausea, melena, diarrhea, constipation, flank pain, dysuria, hematuria, urinary  Frequency, nocturia, numbness, tingling, seizures,  Focal weakness, Loss of consciousness,  Tremor, insomnia, depression, anxiety, and suicidal ideation.      Objective:  BP 126/88 (BP Location: Right Arm, Patient Position: Sitting, Cuff Size: Normal)   Pulse 81   Temp 97.8 F (36.6 C) (Oral)   Ht '5\' 9"'$  (1.753 m)   Wt 189 lb 9.6 oz (86 kg)   SpO2 99%   BMI 28.00 kg/m   BP Readings from Last 3 Encounters:  03/23/22 126/88  02/24/22 130/76  01/02/22 140/76    Wt Readings from Last 3 Encounters:  03/23/22 189 lb 9.6 oz (86 kg)  02/24/22 190 lb (86.2 kg)  01/02/22 181 lb (82.1 kg)    General appearance: alert, cooperative and appears stated age Ears: normal TM's and external ear canals both ears Throat: lips, mucosa, and tongue normal; teeth and gums normal Neck: no adenopathy, no carotid bruit, supple, symmetrical, trachea midline and thyroid not enlarged, symmetric, no tenderness/mass/nodules Back: symmetric, no curvature. ROM normal. No CVA tenderness. Lungs: clear to auscultation bilaterally Heart: regular rate and rhythm, S1, S2 normal, no murmur, click, rub or gallop Abdomen: soft, non-tender; bowel sounds normal; no masses,  no organomegaly Pulses: 2+ and symmetric Skin: Skin color, texture, turgor normal. No rashes or lesions Lymph nodes: Cervical, supraclavicular, and axillary nodes normal.  Lab Results  Component Value Date   HGBA1C 5.8 07/29/2021   HGBA1C 5.7 01/26/2020   HGBA1C 5.9 03/25/2019    Lab Results  Component Value Date   CREATININE 1.15 02/24/2022   CREATININE 1.08 01/02/2022   CREATININE 1.15 07/29/2021    Lab Results  Component Value Date   WBC 7.8 01/13/2022   HGB 14.3 01/13/2022   HCT 42.7 01/13/2022   PLT 289.0 01/13/2022   GLUCOSE 84 02/24/2022   CHOL 162 01/02/2022   TRIG 53  01/02/2022   HDL 54 01/02/2022   LDLDIRECT 145.0 12/03/2015   LDLCALC 94 01/02/2022   ALT 17 02/24/2022   AST 17 02/24/2022   NA 140 02/24/2022   K 4.3 02/24/2022   CL 105 02/24/2022   CREATININE 1.15 02/24/2022   BUN 18 02/24/2022   CO2 26 02/24/2022   TSH 7.49 (H) 02/24/2022   PSA 0.61 01/26/2021   HGBA1C 5.8 07/29/2021   MICROALBUR 2.5 (H) 02/09/2017    No results found.  Assessment & Plan:   Problem List Items Addressed This Visit     Hypothyroidism (acquired) - Primary    TSH was elevated last month and dose was increased to 150 mcg on or around May 2  Lab Results  Component Value Date   TSH 7.49 (H) 02/24/2022         Relevant Orders   Thyroid Panel With TSH   Muscle cramping    CK was normal on day of exam.  Symptoms have dramatically improved, and are now limited to foot cramps , with normalization of thyroid function .  Tizanidine refilled.          PMR (polymyalgia rheumatica) (HCC)    Suggested by joint pain  pain , diffuse,  Elevated CRP of 25.  Pain improved after 4 days of prednisone.  redcucing dose to 15 mg and recheck CRP in 3 weeks   Lab Results  Component Value Date   ESRSEDRATE 17 02/24/2022   Lab Results  Component Value Date   CRP 25.8 (H) 02/24/2022   Lab Results  Component Value Date   CKTOTAL 171 02/24/2022         Relevant Orders   C-reactive protein    Follow-up: Return in about 3 months (around 06/23/2022).   Crecencio Mc, MD

## 2022-03-23 NOTE — Assessment & Plan Note (Signed)
CK was normal on day of exam.  Symptoms have dramatically improved, and are now limited to foot cramps , with normalization of thyroid function .  Tizanidine refilled.

## 2022-03-23 NOTE — Assessment & Plan Note (Addendum)
Suggested by joint pain  pain , diffuse,  Elevated CRP of 25.  Pain improved after 4 days of prednisone.  redcucing dose to 15 mg and recheck CRP in 3 weeks   Lab Results  Component Value Date   ESRSEDRATE 17 02/24/2022   Lab Results  Component Value Date   CRP 25.8 (H) 02/24/2022   Lab Results  Component Value Date   CKTOTAL 171 02/24/2022

## 2022-03-23 NOTE — Patient Instructions (Addendum)
Stop the current prednisone bottle and pick up the lower dose 15 mg and take daily for 30 days  Return for bloodwork after June 15 th  (you do not need to fast)    We will continue to wean the prednisone by 5 mg  every 3 to 4 weeks as tolerated until gone

## 2022-04-13 ENCOUNTER — Ambulatory Visit (INDEPENDENT_AMBULATORY_CARE_PROVIDER_SITE_OTHER): Payer: Self-pay | Admitting: Dermatology

## 2022-04-13 DIAGNOSIS — D1800 Hemangioma unspecified site: Secondary | ICD-10-CM

## 2022-04-13 DIAGNOSIS — D692 Other nonthrombocytopenic purpura: Secondary | ICD-10-CM

## 2022-04-13 DIAGNOSIS — B079 Viral wart, unspecified: Secondary | ICD-10-CM

## 2022-04-13 DIAGNOSIS — Z1283 Encounter for screening for malignant neoplasm of skin: Secondary | ICD-10-CM

## 2022-04-13 DIAGNOSIS — L578 Other skin changes due to chronic exposure to nonionizing radiation: Secondary | ICD-10-CM

## 2022-04-13 DIAGNOSIS — L814 Other melanin hyperpigmentation: Secondary | ICD-10-CM

## 2022-04-13 DIAGNOSIS — L820 Inflamed seborrheic keratosis: Secondary | ICD-10-CM

## 2022-04-13 DIAGNOSIS — L821 Other seborrheic keratosis: Secondary | ICD-10-CM

## 2022-04-13 NOTE — Patient Instructions (Addendum)
Seborrheic Keratosis  What causes seborrheic keratoses? Seborrheic keratoses are harmless, common skin growths that first appear during adult life.  As time goes by, more growths appear.  Some people may develop a large number of them.  Seborrheic keratoses appear on both covered and uncovered body parts.  They are not caused by sunlight.  The tendency to develop seborrheic keratoses can be inherited.  They vary in color from skin-colored to gray, brown, or even black.  They can be either smooth or have a rough, warty surface.   Seborrheic keratoses are superficial and look as if they were stuck on the skin.  Under the microscope this type of keratosis looks like layers upon layers of skin.  That is why at times the top layer may seem to fall off, but the rest of the growth remains and re-grows.    Treatment Seborrheic keratoses do not need to be treated, but can easily be removed in the office.  Seborrheic keratoses often cause symptoms when they rub on clothing or jewelry.  Lesions can be in the way of shaving.  If they become inflamed, they can cause itching, soreness, or burning.  Removal of a seborrheic keratosis can be accomplished by freezing, burning, or surgery. If any spot bleeds, scabs, or grows rapidly, please return to have it checked, as these can be an indication of a skin cancer.  Cryotherapy Aftercare  Wash gently with soap and water everyday.   Apply Vaseline and Band-Aid daily until healed.       Melanoma ABCDEs  Melanoma is the most dangerous type of skin cancer, and is the leading cause of death from skin disease.  You are more likely to develop melanoma if you: Have light-colored skin, light-colored eyes, or red or blond hair Spend a lot of time in the sun Tan regularly, either outdoors or in a tanning bed Have had blistering sunburns, especially during childhood Have a close family member who has had a melanoma Have atypical moles or large birthmarks  Early  detection of melanoma is key since treatment is typically straightforward and cure rates are extremely high if we catch it early.   The first sign of melanoma is often a change in a mole or a new dark spot.  The ABCDE system is a way of remembering the signs of melanoma.  A for asymmetry:  The two halves do not match. B for border:  The edges of the growth are irregular. C for color:  A mixture of colors are present instead of an even brown color. D for diameter:  Melanomas are usually (but not always) greater than 6mm - the size of a pencil eraser. E for evolution:  The spot keeps changing in size, shape, and color.  Please check your skin once per month between visits. You can use a small mirror in front and a large mirror behind you to keep an eye on the back side or your body.   If you see any new or changing lesions before your next follow-up, please call to schedule a visit.  Please continue daily skin protection including broad spectrum sunscreen SPF 30+ to sun-exposed areas, reapplying every 2 hours as needed when you're outdoors.   Staying in the shade or wearing long sleeves, sun glasses (UVA+UVB protection) and wide brim hats (4-inch brim around the entire circumference of the hat) are also recommended for sun protection.     Due to recent changes in healthcare laws, you may see results of   your pathology and/or laboratory studies on MyChart before the doctors have had a chance to review them. We understand that in some cases there may be results that are confusing or concerning to you. Please understand that not all results are received at the same time and often the doctors may need to interpret multiple results in order to provide you with the best plan of care or course of treatment. Therefore, we ask that you please give us 2 business days to thoroughly review all your results before contacting the office for clarification. Should we see a critical lab result, you will be contacted  sooner.   If You Need Anything After Your Visit  If you have any questions or concerns for your doctor, please call our main line at [phone] and press option 4 to reach your doctor's medical assistant. If no one answers, please leave a voicemail as directed and we will return your call as soon as possible. Messages left after 4 pm will be answered the following business day.   You may also send us a message via MyChart. We typically respond to MyChart messages within 1-2 business days.  For prescription refills, please ask your pharmacy to contact our office. Our fax number is [phone].  If you have an urgent issue when the clinic is closed that cannot wait until the next business day, you can page your doctor at the number below.    Please note that while we do our best to be available for urgent issues outside of office hours, we are not available 24/7.   If you have an urgent issue and are unable to reach us, you may choose to seek medical care at your doctor's office, retail clinic, urgent care center, or emergency room.  If you have a medical emergency, please immediately call 911 or go to the emergency department.  Pager Numbers  - Dr. Kowalski: [phone]  - Dr. Moye: [phone]  - Dr. Stewart: [phone]  In the event of inclement weather, please call our main line at [phone] for an update on the status of any delays or closures.  Dermatology Medication Tips: Please keep the boxes that topical medications come in in order to help keep track of the instructions about where and how to use these. Pharmacies typically print the medication instructions only on the boxes and not directly on the medication tubes.   If your medication is too expensive, please contact our office at [phone] option 4 or send us a message through MyChart.   We are unable to tell what your co-pay for medications will be in advance as this is different depending on your insurance  coverage. However, we may be able to find a substitute medication at lower cost or fill out paperwork to get insurance to cover a needed medication.   If a prior authorization is required to get your medication covered by your insurance company, please allow us 1-2 business days to complete this process.  Drug prices often vary depending on where the prescription is filled and some pharmacies may offer cheaper prices.  The website www.goodrx.com contains coupons for medications through different pharmacies. The prices here do not account for what the cost may be with help from insurance (it may be cheaper with your insurance), but the website can give you the price if you did not use any insurance.  - You can print the associated coupon and take it with your prescription to the pharmacy.  - You may also stop   by our office during regular business hours and pick up a GoodRx coupon card.  - If you need your prescription sent electronically to a different pharmacy, notify our office through [facility] MyChart or by phone at [phone] option 4.     Si Usted Necesita Algo Despus de Su Visita  Tambin puede enviarnos un mensaje a travs de MyChart. Por lo general respondemos a los mensajes de MyChart en el transcurso de 1 a 2 das hbiles.  Para renovar recetas, por favor pida a su farmacia que se ponga en contacto con nuestra oficina. Nuestro nmero de fax es el [phone].  Si tiene un asunto urgente cuando la clnica est cerrada y que no puede esperar hasta el siguiente da hbil, puede llamar/localizar a su doctor(a) al nmero que aparece a continuacin.   Por favor, tenga en cuenta que aunque hacemos todo lo posible para estar disponibles para asuntos urgentes fuera del horario de oficina, no estamos disponibles las 24 horas del da, los 7 das de la semana.   Si tiene un problema urgente y no puede comunicarse con nosotros, puede optar por buscar atencin mdica  en el consultorio de  su doctor(a), en una clnica privada, en un centro de atencin urgente o en una sala de emergencias.  Si tiene una emergencia mdica, por favor llame inmediatamente al 911 o vaya a la sala de emergencias.  Nmeros de bper  - Dr. Kowalski: [phone]  - Dra. Moye: [phone]  - Dra. Stewart: [phone]  En caso de inclemencias del tiempo, por favor llame a nuestra lnea principal al [phone] para una actualizacin sobre el estado de cualquier retraso o cierre.  Consejos para la medicacin en dermatologa: Por favor, guarde las cajas en las que vienen los medicamentos de uso tpico para ayudarle a seguir las instrucciones sobre dnde y cmo usarlos. Las farmacias generalmente imprimen las instrucciones del medicamento slo en las cajas y no directamente en los tubos del medicamento.   Si su medicamento es muy caro, por favor, pngase en contacto con nuestra oficina llamando al [phone] y presione la opcin 4 o envenos un mensaje a travs de MyChart.   No podemos decirle cul ser su copago por los medicamentos por adelantado ya que esto es diferente dependiendo de la cobertura de su seguro. Sin embargo, es posible que podamos encontrar un medicamento sustituto a menor costo o llenar un formulario para que el seguro cubra el medicamento que se considera necesario.   Si se requiere una autorizacin previa para que su compaa de seguros cubra su medicamento, por favor permtanos de 1 a 2 das hbiles para completar este proceso.  Los precios de los medicamentos varan con frecuencia dependiendo del lugar de dnde se surte la receta y alguna farmacias pueden ofrecer precios ms baratos.  El sitio web www.goodrx.com tiene cupones para medicamentos de diferentes farmacias. Los precios aqu no tienen en cuenta lo que podra costar con la ayuda del seguro (puede ser ms barato con su seguro), pero el sitio web puede darle el precio si no utiliz ningn seguro.  - Puede imprimir el  cupn correspondiente y llevarlo con su receta a la farmacia.  - Tambin puede pasar por nuestra oficina durante el horario de atencin regular y recoger una tarjeta de cupones de GoodRx.  - Si necesita que su receta se enve electrnicamente a una farmacia diferente, informe a nuestra oficina a travs de MyChart de [facility] o por telfono llamando al [phone] y presione la opcin 4.  

## 2022-04-13 NOTE — Progress Notes (Unsigned)
   Follow-Up Visit   Subjective  Tim Owen is a 56 y.o. male who presents for the following: Follow-up (No hx of skin cancer, hx of isks, hx of warts. Reports some spots at hands, chest, and neck he would like checked. ). The patient presents for Upper Body Skin Exam (UBSE) for skin cancer screening and mole check.  The patient has spots, moles and lesions to be evaluated, some may be new or changing and the patient has concerns that these could be cancer.  The following portions of the chart were reviewed this encounter and updated as appropriate:  Tobacco  Allergies  Meds  Problems  Med Hx  Surg Hx  Fam Hx     Review of Systems: No other skin or systemic complaints except as noted in HPI or Assessment and Plan.  Objective  Well appearing patient in no apparent distress; mood and affect are within normal limits.  All skin waist up examined.  right upper back x 2, right clavicle x 1, face x 2, left chest x 1 (6) Erythematous stuck-on, waxy papule or plaque  right hand x 2, left hand x 2 Verrucous papules -- Discussed viral etiology and contagion.    Assessment & Plan   Lentigines - Scattered tan macules - Due to sun exposure - Benign-appearing, observe - Recommend daily broad spectrum sunscreen SPF 30+ to sun-exposed areas, reapply every 2 hours as needed. - Call for any changes  Seborrheic Keratoses - Stuck-on, waxy, tan-brown papules and/or plaques  - Benign-appearing - Discussed benign etiology and prognosis. - Observe - Call for any changes  Melanocytic Nevi - Tan-brown and/or pink-flesh-colored symmetric macules and papules - Benign appearing on exam today - Observation - Call clinic for new or changing moles - Recommend daily use of broad spectrum spf 30+ sunscreen to sun-exposed areas.   Purpura - Chronic; persistent and recurrent.  Treatable, but not curable. - Violaceous macules and patches - Benign - Related to trauma, age, sun damage and/or use  of blood thinners, chronic use of topical and/or oral steroids - Observe - Can use OTC arnica containing moisturizer such as Dermend Bruise Formula if desired - Call for worsening or other concerns  Hemangiomas - Red papules - Discussed benign nature - Observe - Call for any changes  Actinic Damage - Chronic condition, secondary to cumulative UV/sun exposure - diffuse scaly erythematous macules with underlying dyspigmentation - Recommend daily broad spectrum sunscreen SPF 30+ to sun-exposed areas, reapply every 2 hours as needed.  - Staying in the shade or wearing long sleeves, sun glasses (UVA+UVB protection) and wide brim hats (4-inch brim around the entire circumference of the hat) are also recommended for sun protection.  - Call for new or changing lesions.  Skin cancer screening performed today.  Return in about 1 year (around 04/14/2023) for TBSE. IRuthell Rummage, CMA, am acting as scribe for Sarina Ser, MD. Documentation: I have reviewed the above documentation for accuracy and completeness, and I agree with the above.  Sarina Ser, MD

## 2022-04-14 ENCOUNTER — Other Ambulatory Visit: Payer: Self-pay | Admitting: Internal Medicine

## 2022-04-14 ENCOUNTER — Other Ambulatory Visit (INDEPENDENT_AMBULATORY_CARE_PROVIDER_SITE_OTHER): Payer: Self-pay

## 2022-04-14 DIAGNOSIS — M353 Polymyalgia rheumatica: Secondary | ICD-10-CM

## 2022-04-14 DIAGNOSIS — E039 Hypothyroidism, unspecified: Secondary | ICD-10-CM

## 2022-04-14 LAB — C-REACTIVE PROTEIN: CRP: <1 mg/dL (ref 0.5–20.0)

## 2022-04-14 MED ORDER — PREDNISONE 10 MG PO TABS: 10.0000 mg | ORAL_TABLET | Freq: Every day | ORAL | 1 refills | Status: DC

## 2022-04-14 NOTE — Assessment & Plan Note (Signed)
CRP is normal after reduction in dose of prednisone to 15 mg.  Reduce dose to 10 mg daily .  Repeat CRP in 4 weeks

## 2022-04-15 LAB — THYROID PANEL WITH TSH
Free Thyroxine Index: 3.2 (ref 1.4–3.8)
T3 Uptake: 38 % — ABNORMAL HIGH (ref 22–35)
T4, Total: 8.5 ug/dL (ref 4.9–10.5)
TSH: 0.68 mIU/L (ref 0.40–4.50)

## 2022-04-18 ENCOUNTER — Encounter: Payer: Self-pay | Admitting: Dermatology

## 2022-05-10 ENCOUNTER — Other Ambulatory Visit: Payer: Self-pay | Admitting: Internal Medicine

## 2022-06-14 ENCOUNTER — Other Ambulatory Visit: Payer: Self-pay | Admitting: Internal Medicine

## 2022-06-23 ENCOUNTER — Encounter: Payer: Self-pay | Admitting: Internal Medicine

## 2022-06-23 ENCOUNTER — Ambulatory Visit (INDEPENDENT_AMBULATORY_CARE_PROVIDER_SITE_OTHER): Payer: Self-pay | Admitting: Internal Medicine

## 2022-06-23 VITALS — BP 130/80 | HR 75 | Temp 97.6°F | Ht 69.0 in | Wt 188.0 lb

## 2022-06-23 DIAGNOSIS — R7303 Prediabetes: Secondary | ICD-10-CM

## 2022-06-23 DIAGNOSIS — M353 Polymyalgia rheumatica: Secondary | ICD-10-CM

## 2022-06-23 DIAGNOSIS — E785 Hyperlipidemia, unspecified: Secondary | ICD-10-CM

## 2022-06-23 DIAGNOSIS — Z125 Encounter for screening for malignant neoplasm of prostate: Secondary | ICD-10-CM

## 2022-06-23 DIAGNOSIS — I10 Essential (primary) hypertension: Secondary | ICD-10-CM

## 2022-06-23 DIAGNOSIS — Z970 Presence of artificial eye: Secondary | ICD-10-CM

## 2022-06-23 DIAGNOSIS — E039 Hypothyroidism, unspecified: Secondary | ICD-10-CM

## 2022-06-23 LAB — COMPREHENSIVE METABOLIC PANEL
ALT: 17 U/L (ref 0–53)
AST: 17 U/L (ref 0–37)
Albumin: 4.5 g/dL (ref 3.5–5.2)
Alkaline Phosphatase: 42 U/L (ref 39–117)
BUN: 22 mg/dL (ref 6–23)
CO2: 27 mEq/L (ref 19–32)
Calcium: 9.5 mg/dL (ref 8.4–10.5)
Chloride: 103 mEq/L (ref 96–112)
Creatinine, Ser: 1.21 mg/dL (ref 0.40–1.50)
GFR: 66.6 mL/min (ref >60.00)
Glucose, Bld: 89 mg/dL (ref 70–99)
Potassium: 4.2 mEq/L (ref 3.5–5.1)
Sodium: 138 mEq/L (ref 135–145)
Total Bilirubin: 0.6 mg/dL (ref 0.2–1.2)
Total Protein: 6.3 g/dL (ref 6.0–8.3)

## 2022-06-23 LAB — PSA: PSA: 0.4 ng/mL (ref 0.10–4.00)

## 2022-06-23 LAB — MICROALBUMIN / CREATININE URINE RATIO
Creatinine,U: 34.2 mg/dL
Microalb Creat Ratio: 2 mg/g (ref 0.0–30.0)
Microalb, Ur: <0.7 mg/dL (ref 0.0–1.9)

## 2022-06-23 LAB — C-REACTIVE PROTEIN: CRP: <1 mg/dL (ref 0.5–20.0)

## 2022-06-23 LAB — HEMOGLOBIN A1C: Hgb A1c MFr Bld: 5.9 % (ref 4.6–6.5)

## 2022-06-23 NOTE — Patient Instructions (Addendum)
Reduce the prednisone to 5 mg if your CRP is normal  today .    If the muscle pain comes back, resume the 10 mg dose and when you return from [facility] we will change the dose to 7.5 mg daily

## 2022-06-23 NOTE — Assessment & Plan Note (Addendum)
counselled on the need to reduce sodium in diet. No changes today.  DASH diet given

## 2022-06-23 NOTE — Assessment & Plan Note (Signed)
Currently taking 10 mg  Since June without return of symptoms . Checking CRP today and reducing dose to 5 mg daily if CRP is normal.  If symptoms return,  We will increase dose to 7.5 mg daily

## 2022-06-23 NOTE — Progress Notes (Unsigned)
Subjective:  Patient ID: Tim Owen, male    DOB: May 29, 1966  Age: 56 y.o. MRN: [id number]  CC: The primary encounter diagnosis was Essential hypertension. Diagnoses of Hyperlipidemia LDL goal <100, Prediabetes, Prostate cancer screening, and PMR (polymyalgia rheumatica) (HCC) were also pertinent to this visit.   HPI [name] presents for follow up on PMR Chief Complaint  Patient presents with   Follow-up    3 month follow up    PMR:  he denies muscle pain .[name]  He is Currently taking 10 mg (reduced twice from starting dose of 20 mg ) .  Having bilateral buttock pain after falling onto buttocks during a mishap 2 weeks ago    HTN:  had a  lot of salt yesterday , in fact for the last several weeks, bc he is eating a tomato sandwich every day     Outpatient Medications Prior to Visit  Medication Sig Dispense Refill   esomeprazole (NEXIUM) 20 MG packet Take 20 mg by mouth daily before breakfast.     losartan (COZAAR) 50 MG tablet TAKE 1 TABLET BY MOUTH DAILY 90 tablet 0   predniSONE (DELTASONE) 10 MG tablet TAKE ONE TABLET BY MOUTH EVERY DAY WITH BREAKFAST 30 tablet 1   SYNTHROID 150 MCG tablet Take 1 tablet (150 mcg total) by mouth daily before breakfast. 90 tablet 1   tiZANidine (ZANAFLEX) 4 MG tablet Take 1 tablet (4 mg total) by mouth every 6 (six) hours as needed for muscle spasms. (Patient not taking: Reported on 06/23/2022) 30 tablet 0   Facility-Administered Medications Prior to Visit  Medication Dose Route Frequency Provider Last Rate Last Admin   lidocaine (XYLOCAINE) 2 % (with pres) injection 30 mg  1.5 mL Other Once Crecencio Mc, MD       triamcinolone acetonide (KENALOG-40) injection 20 mg  20 mg Intra-articular Once Crecencio Mc, MD        Review of Systems;  Patient denies headache, fevers, malaise, unintentional weight loss, skin rash, eye pain, sinus congestion and sinus pain, sore throat, dysphagia,  hemoptysis , cough, dyspnea, wheezing, chest pain,  palpitations, orthopnea, edema, abdominal pain, nausea, melena, diarrhea, constipation, flank pain, dysuria, hematuria, urinary  Frequency, nocturia, numbness, tingling, seizures,  Focal weakness, Loss of consciousness,  Tremor, insomnia, depression, anxiety, and suicidal ideation.      Objective:  BP 130/80   Pulse 75   Temp 97.6 F (36.4 C) (Oral)   Ht 5' 9" (1.753 m)   Wt 188 lb (85.3 kg)   SpO2 95%   BMI 27.76 kg/m   BP Readings from Last 3 Encounters:  06/23/22 130/80  03/23/22 126/88  02/24/22 130/76    Wt Readings from Last 3 Encounters:  06/23/22 188 lb (85.3 kg)  03/23/22 189 lb 9.6 oz (86 kg)  02/24/22 190 lb (86.2 kg)    General appearance: alert, cooperative and appears stated age Ears: normal TM's and external ear canals both ears Throat: lips, mucosa, and tongue normal; teeth and gums normal Neck: no adenopathy, no carotid bruit, supple, symmetrical, trachea midline and thyroid not enlarged, symmetric, no tenderness/mass/nodules Back: symmetric, no curvature. ROM normal. No CVA tenderness. Lungs: clear to auscultation bilaterally Heart: regular rate and rhythm, S1, S2 normal, no murmur, click, rub or gallop Abdomen: soft, non-tender; bowel sounds normal; no masses,  no organomegaly Pulses: 2+ and symmetric Skin: Skin color, texture, turgor normal. No rashes or lesions Lymph nodes: Cervical, supraclavicular, and axillary nodes normal.  Lab  Results  Component Value Date   HGBA1C 5.8 07/29/2021   HGBA1C 5.7 01/26/2020   HGBA1C 5.9 03/25/2019    Lab Results  Component Value Date   CREATININE 1.15 02/24/2022   CREATININE 1.08 01/02/2022   CREATININE 1.15 07/29/2021    Lab Results  Component Value Date   WBC 7.8 01/13/2022   HGB 14.3 01/13/2022   HCT 42.7 01/13/2022   PLT 289.0 01/13/2022   GLUCOSE 84 02/24/2022   CHOL 162 01/02/2022   TRIG 53 01/02/2022   HDL 54 01/02/2022   LDLDIRECT 145.0 12/03/2015   LDLCALC 94 01/02/2022   ALT 17  02/24/2022   AST 17 02/24/2022   NA 140 02/24/2022   K 4.3 02/24/2022   CL 105 02/24/2022   CREATININE 1.15 02/24/2022   BUN 18 02/24/2022   CO2 26 02/24/2022   TSH 0.68 04/14/2022   PSA 0.61 01/26/2021   HGBA1C 5.8 07/29/2021   MICROALBUR 2.5 (H) 02/09/2017    No results found.  Assessment & Plan:   Problem List Items Addressed This Visit     Essential hypertension - Primary    He has been using a lot of salt daily due to the tomatoes he has been eating no changes today.  DASH diet given       Relevant Orders   Comp Met (CMET)   Urine Microalbumin w/creat. ratio   Hyperlipidemia LDL goal <100   PMR (polymyalgia rheumatica) (HCC)    Currently taking 10 mg  Since June without return of symptoms . Checking CRP today and reducing dose to 5 mg daily if CRP is normal.  If symptoms return,  We will increase dose to 7.5 mg daily       Relevant Orders   C-reactive protein   Prediabetes   Relevant Orders   Comp Met (CMET)   Urine Microalbumin w/creat. ratio   HgB A1c   Other Visit Diagnoses     Prostate cancer screening       Relevant Orders   PSA       Follow-up: No follow-ups on file.   Teresa L Tullo, MD 

## 2022-06-24 NOTE — Assessment & Plan Note (Addendum)
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

## 2022-06-24 NOTE — Assessment & Plan Note (Signed)
TSH was elevated last month and dose was increased to 150 mcg on or around May 2 .  Repeat level is normal  Lab Results  Component Value Date   TSH 0.68 04/14/2022

## 2022-07-24 ENCOUNTER — Other Ambulatory Visit: Payer: Self-pay | Admitting: Internal Medicine

## 2022-07-24 ENCOUNTER — Telehealth: Payer: Self-pay | Admitting: Internal Medicine

## 2022-07-24 DIAGNOSIS — R7303 Prediabetes: Secondary | ICD-10-CM

## 2022-07-24 DIAGNOSIS — E039 Hypothyroidism, unspecified: Secondary | ICD-10-CM

## 2022-07-24 DIAGNOSIS — M353 Polymyalgia rheumatica: Secondary | ICD-10-CM

## 2022-07-24 NOTE — Telephone Encounter (Signed)
Pt called stating he has dropped his steroids to five mg and all the problems are coming back. Pt would like to be called

## 2022-07-24 NOTE — Telephone Encounter (Signed)
Pt is scheduled for a lab appt tomorrow. Pt is aware of appt date and time.

## 2022-07-25 ENCOUNTER — Other Ambulatory Visit (INDEPENDENT_AMBULATORY_CARE_PROVIDER_SITE_OTHER): Payer: Self-pay

## 2022-07-25 DIAGNOSIS — M353 Polymyalgia rheumatica: Secondary | ICD-10-CM

## 2022-07-25 LAB — C-REACTIVE PROTEIN: CRP: <1 mg/dL (ref 0.5–20.0)

## 2022-07-25 LAB — SEDIMENTATION RATE: Sed Rate: 10 mm/hr (ref 0–20)

## 2022-08-09 ENCOUNTER — Encounter: Payer: Self-pay | Admitting: Internal Medicine

## 2022-08-10 ENCOUNTER — Other Ambulatory Visit: Payer: Self-pay | Admitting: Internal Medicine

## 2022-08-11 ENCOUNTER — Other Ambulatory Visit: Payer: Self-pay | Admitting: Internal Medicine

## 2022-08-21 ENCOUNTER — Other Ambulatory Visit: Payer: Self-pay | Admitting: Internal Medicine

## 2022-10-17 ENCOUNTER — Encounter: Payer: Self-pay | Admitting: Internal Medicine

## 2022-10-17 ENCOUNTER — Ambulatory Visit (INDEPENDENT_AMBULATORY_CARE_PROVIDER_SITE_OTHER): Payer: Self-pay | Admitting: Internal Medicine

## 2022-10-17 VITALS — BP 124/86 | HR 70 | Temp 97.9°F | Ht 69.0 in | Wt 194.8 lb

## 2022-10-17 DIAGNOSIS — G2581 Restless legs syndrome: Secondary | ICD-10-CM

## 2022-10-17 DIAGNOSIS — M353 Polymyalgia rheumatica: Secondary | ICD-10-CM

## 2022-10-17 DIAGNOSIS — M5030 Other cervical disc degeneration, unspecified cervical region: Secondary | ICD-10-CM

## 2022-10-17 DIAGNOSIS — E039 Hypothyroidism, unspecified: Secondary | ICD-10-CM

## 2022-10-17 MED ORDER — TIZANIDINE HCL 4 MG PO TABS: ORAL_TABLET | ORAL | 5 refills | Status: DC

## 2022-10-17 MED ORDER — PREDNISONE 1 MG PO TABS: 4.0000 mg | ORAL_TABLET | Freq: Every day | ORAL | 0 refills | Status: DC

## 2022-10-17 NOTE — Patient Instructions (Signed)
Here is your prednisone taper using 1 mg tablets:  4 tablets daily x 7 days  2 tablets daily x 7 days  1 tablet daily x 7 days  STOP

## 2022-10-17 NOTE — Progress Notes (Signed)
Subjective:  Patient ID: Tim Owen, male    DOB: 1966/09/12  Age: 56 y.o. MRN: [id number]  CC: The primary encounter diagnosis was PMR (polymyalgia rheumatica) ([facility]). Diagnoses of Restless legs, Degeneration of cervical intervertebral disc, and Hypothyroidism (acquired) were also pertinent to this visit.   HPI Tim Owen presents for  Chief Complaint  Patient presents with   Medical Management of Chronic Issues   Treated for PMR when CRP was 25 in April ; starting dose was 20 mg and has been reduced by 5 mg monthly, b ut has been tang 5 mg daily since Sept 1    Inflammatory markers were normal Sept 26 and pain was severe so he went to see a Restaurant manager, fast food.  And had Several x rays  done. Cervical  and lumbar degenerative changes with loss of disk space . The manipulation is working   Feet are restless  , Has to change positions,  and sleep prone with feet against  the end of the bed to sleep which is messing up his back    Outpatient Medications Prior to Visit  Medication Sig Dispense Refill   esomeprazole (NEXIUM) 20 MG packet Take 20 mg by mouth daily before breakfast.     losartan (COZAAR) 50 MG tablet TAKE 1 TABLET BY MOUTH DAILY 90 tablet 0   SYNTHROID 150 MCG tablet Take 1 tablet (150 mcg total) by mouth daily before breakfast. 90 tablet 1   predniSONE (DELTASONE) 10 MG tablet TAKE ONE TABLET BY MOUTH EVERY DAY WITH BREAKFAST (Patient taking differently: Take 5 mg by mouth daily with breakfast.) 30 tablet 1   tiZANidine (ZANAFLEX) 4 MG tablet TAKE 1 TABLET BY MOUTH EVERY SIX HOURS AS NEEDED FOR MUSCLE SPASMS 30 tablet 0   Facility-Administered Medications Prior to Visit  Medication Dose Route Frequency Provider Last Rate Last Admin   lidocaine (XYLOCAINE) 2 % (with pres) injection 30 mg  1.5 mL Other Once Crecencio Mc, MD       triamcinolone acetonide (KENALOG-40) injection 20 mg  20 mg Intra-articular Once Crecencio Mc, MD        Review of Systems;  Patient  denies headache, fevers, malaise, unintentional weight loss, skin rash, eye pain, sinus congestion and sinus pain, sore throat, dysphagia,  hemoptysis , cough, dyspnea, wheezing, chest pain, palpitations, orthopnea, edema, abdominal pain, nausea, melena, diarrhea, constipation, flank pain, dysuria, hematuria, urinary  Frequency, nocturia, numbness, tingling, seizures,  Focal weakness, Loss of consciousness,  Tremor, insomnia, depression, anxiety, and suicidal ideation.      Objective:  BP 124/86   Pulse 70   Temp 97.9 F (36.6 C) (Oral)   Ht '5\' 9"'$  (1.753 m)   Wt 194 lb 12.8 oz (88.4 kg)   SpO2 97%   BMI 28.77 kg/m   BP Readings from Last 3 Encounters:  10/17/22 124/86  06/23/22 130/80  03/23/22 126/88    Wt Readings from Last 3 Encounters:  10/17/22 194 lb 12.8 oz (88.4 kg)  06/23/22 188 lb (85.3 kg)  03/23/22 189 lb 9.6 oz (86 kg)    Physical Exam Vitals reviewed.  Constitutional:      General: He is not in acute distress.    Appearance: Normal appearance. He is normal weight. He is not ill-appearing, toxic-appearing or diaphoretic.  HENT:     Head: Normocephalic.  Eyes:     General: No scleral icterus.       Right eye: No discharge.  Left eye: No discharge.     Conjunctiva/sclera: Conjunctivae normal.  Cardiovascular:     Rate and Rhythm: Normal rate and regular rhythm.     Heart sounds: Normal heart sounds.  Pulmonary:     Effort: Pulmonary effort is normal. No respiratory distress.     Breath sounds: Normal breath sounds.  Musculoskeletal:        General: Normal range of motion.     Cervical back: Normal range of motion.  Skin:    General: Skin is warm and dry.  Neurological:     General: No focal deficit present.     Mental Status: He is alert and oriented to person, place, and time. Mental status is at baseline.  Psychiatric:        Mood and Affect: Mood normal.        Behavior: Behavior normal.        Thought Content: Thought content normal.         Judgment: Judgment normal.     Lab Results  Component Value Date   HGBA1C 5.9 06/23/2022   HGBA1C 5.8 07/29/2021   HGBA1C 5.7 01/26/2020    Lab Results  Component Value Date   CREATININE 1.21 06/23/2022   CREATININE 1.15 02/24/2022   CREATININE 1.08 01/02/2022    Lab Results  Component Value Date   WBC 7.8 01/13/2022   HGB 14.3 01/13/2022   HCT 42.7 01/13/2022   PLT 289.0 01/13/2022   GLUCOSE 89 06/23/2022   CHOL 162 01/02/2022   TRIG 53 01/02/2022   HDL 54 01/02/2022   LDLDIRECT 145.0 12/03/2015   LDLCALC 94 01/02/2022   ALT 17 06/23/2022   AST 17 06/23/2022   NA 138 06/23/2022   K 4.2 06/23/2022   CL 103 06/23/2022   CREATININE 1.21 06/23/2022   BUN 22 06/23/2022   CO2 27 06/23/2022   TSH 0.68 04/14/2022   PSA 0.40 06/23/2022   HGBA1C 5.9 06/23/2022   MICROALBUR <0.7 06/23/2022    No results found.  Assessment & Plan:  .PMR (polymyalgia rheumatica) (HCC) Assessment & Plan: Currently taking 5  mg  Since August  without return of symptoms .  Begin wean from  5 mg daily      Component Value Date/Time   CRP <1.0 07/25/2022 0956   CRP <1.0 06/23/2022 1038   CRP <1.0 04/14/2022 0915     Restless legs Assessment & Plan: He declines medications.  Iron deficiency has been ruled out    Degeneration of cervical intervertebral disc Assessment & Plan: Diagnosed by chiropractor with plain films also noted in the lumbar spine.  Manipulation has resolved his shoulder and arm pain    Hypothyroidism (acquired) Assessment & Plan: TSH is normal on 150 mcg daily  Lab Results  Component Value Date   TSH 0.68 04/14/2022      Other orders -     predniSONE; Take 4 tablets (4 mg total) by mouth daily with breakfast. For one week,  then 2 mg daily for one week then 1 mg daily x 1 week ,  then stop  Dispense: 49 tablet; Refill: 0 -     tiZANidine HCl; TAKE 1 TABLET BY MOUTH EVERY SIX HOURS AS NEEDED FOR MUSCLE SPASMS  Dispense: 30 tablet; Refill:  5     Follow-up: No follow-ups on file.   Crecencio Mc, MD

## 2022-10-17 NOTE — Assessment & Plan Note (Signed)
He declines medications.  Iron deficiency has been ruled out

## 2022-10-17 NOTE — Assessment & Plan Note (Signed)
Currently taking 5  mg  Since August  without return of symptoms .  Begin wean from  5 mg daily      Component Value Date/Time   CRP <1.0 07/25/2022 0956   CRP <1.0 06/23/2022 1038   CRP <1.0 04/14/2022 0915

## 2022-10-17 NOTE — Assessment & Plan Note (Signed)
TSH is normal on 150 mcg daily  Lab Results  Component Value Date   TSH 0.68 04/14/2022

## 2022-10-17 NOTE — Assessment & Plan Note (Signed)
Diagnosed by chiropractor with plain films also noted in the lumbar spine.  Manipulation has resolved his shoulder and arm pain

## 2022-11-15 ENCOUNTER — Other Ambulatory Visit: Payer: Self-pay | Admitting: Internal Medicine

## 2022-11-17 ENCOUNTER — Encounter: Payer: Self-pay | Admitting: Internal Medicine

## 2022-11-17 ENCOUNTER — Other Ambulatory Visit: Payer: Self-pay | Admitting: Internal Medicine

## 2022-11-17 ENCOUNTER — Telehealth: Payer: Self-pay | Admitting: Internal Medicine

## 2022-11-17 DIAGNOSIS — M353 Polymyalgia rheumatica: Secondary | ICD-10-CM

## 2022-11-17 MED ORDER — PREDNISONE 20 MG PO TABS: 20.0000 mg | ORAL_TABLET | Freq: Every day | ORAL | 0 refills | Status: DC

## 2022-11-17 NOTE — Telephone Encounter (Signed)
Spoke with pt to let him know that the prednisone has been sent in and the lab appt has been scheduled for Monday.

## 2022-11-17 NOTE — Telephone Encounter (Signed)
Spoke with pt and he stated that he has completely weaned off the prednisone. He stated that he feels like he is "crashing". He stated that everything that was hurting before going on prednisone is hurting again and worse. He also that he does not feel like doing anything. Pt is wanting to know if he needs to go back on the prednisone.

## 2022-11-17 NOTE — Telephone Encounter (Signed)
Pt called stating he was prescribed prednisone because he was treated for PMR and he feels like he is crashing because he need more medication. Pt does not feel like doing anything.

## 2022-11-17 NOTE — Telephone Encounter (Signed)
Patient called and stated know one has called him yet, (1:26pm). Patient was advised that provider was seeing patients and his telephone call from this morning has been forward to Dr Derrel Nip.

## 2022-11-17 NOTE — Progress Notes (Signed)
PMR recurrence  prednisone 20 mg daily

## 2022-11-20 ENCOUNTER — Other Ambulatory Visit (INDEPENDENT_AMBULATORY_CARE_PROVIDER_SITE_OTHER): Payer: Self-pay

## 2022-11-20 DIAGNOSIS — M353 Polymyalgia rheumatica: Secondary | ICD-10-CM

## 2022-11-20 LAB — C-REACTIVE PROTEIN: CRP: <1 mg/dL (ref 0.5–20.0)

## 2022-11-20 LAB — SEDIMENTATION RATE: Sed Rate: 25 mm/hr — ABNORMAL HIGH (ref 0–20)

## 2022-12-20 ENCOUNTER — Encounter: Payer: Self-pay | Admitting: Internal Medicine

## 2022-12-20 ENCOUNTER — Ambulatory Visit (INDEPENDENT_AMBULATORY_CARE_PROVIDER_SITE_OTHER): Payer: Self-pay | Admitting: Internal Medicine

## 2022-12-20 VITALS — BP 134/82 | HR 75 | Temp 97.5°F | Ht 69.0 in | Wt 199.6 lb

## 2022-12-20 DIAGNOSIS — Z970 Presence of artificial eye: Secondary | ICD-10-CM

## 2022-12-20 DIAGNOSIS — S61219S Laceration without foreign body of unspecified finger without damage to nail, sequela: Secondary | ICD-10-CM

## 2022-12-20 DIAGNOSIS — S61219A Laceration without foreign body of unspecified finger without damage to nail, initial encounter: Secondary | ICD-10-CM

## 2022-12-20 DIAGNOSIS — M353 Polymyalgia rheumatica: Secondary | ICD-10-CM

## 2022-12-20 LAB — SEDIMENTATION RATE: Sed Rate: 10 mm/hr (ref 0–20)

## 2022-12-20 LAB — C-REACTIVE PROTEIN: CRP: <1 mg/dL (ref 0.5–20.0)

## 2022-12-20 MED ORDER — CEPHALEXIN 500 MG PO CAPS: 500.0000 mg | ORAL_CAPSULE | Freq: Four times a day (QID) | ORAL | 0 refills | Status: DC

## 2022-12-20 NOTE — Assessment & Plan Note (Signed)
He has had recurrence of pain with previous drop in dose of prednisone from 20 mg to 10 mg.  Currently taking 20 mg and no symptoms are present. Will note ESR and CRPs today and reduce dose to 18 mg

## 2022-12-20 NOTE — Progress Notes (Unsigned)
Subjective:  Patient ID: Tim Owen, male    DOB: [birth year]  Age: 57 y.o. MRN: [id number]  CC: There were no encounter diagnoses.   HPI Tim Owen presents for  Chief Complaint  Patient presents with   Medical Management of Chronic Issues    1 month follow up    1) PMR:  recurrent episode last May.   Symptoms  of joint pain involving shoulders and hips ,  ankles and feet returned at 10 mg and dose was increased  and he has been seeing a chiropractor for manipulation which has helped, has been taking 20 mg daily for the last several months due to return of pain at lower doses. .    Currently taking  5 mg daily.  Last ESR was elevated Jan 22 but CRP normal . Symptoms were still manageable   2) had an infection in the eye socket 2 weeks ago , now healed.     Left index finger laceration on a hedge trimmer.  Occurred last night   Outpatient Medications Prior to Visit  Medication Sig Dispense Refill   esomeprazole (NEXIUM) 20 MG packet Take 20 mg by mouth daily before breakfast.     losartan (COZAAR) 50 MG tablet TAKE 1 TABLET BY MOUTH DAILY 90 tablet 1   predniSONE (DELTASONE) 20 MG tablet Take 1 tablet (20 mg total) by mouth daily with breakfast. 30 tablet 0   SYNTHROID 150 MCG tablet Take 1 tablet (150 mcg total) by mouth daily before breakfast. 90 tablet 1   tiZANidine (ZANAFLEX) 4 MG tablet TAKE 1 TABLET BY MOUTH EVERY SIX HOURS AS NEEDED FOR MUSCLE SPASMS 30 tablet 5   Facility-Administered Medications Prior to Visit  Medication Dose Route Frequency Provider Last Rate Last Admin   lidocaine (XYLOCAINE) 2 % (with pres) injection 30 mg  1.5 mL Other Once Crecencio Mc, MD       triamcinolone acetonide (KENALOG-40) injection 20 mg  20 mg Intra-articular Once Crecencio Mc, MD        Review of Systems;  Patient denies headache, fevers, malaise, unintentional weight loss, skin rash, eye pain, sinus congestion and sinus pain, sore throat, dysphagia,  hemoptysis , cough,  dyspnea, wheezing, chest pain, palpitations, orthopnea, edema, abdominal pain, nausea, melena, diarrhea, constipation, flank pain, dysuria, hematuria, urinary  Frequency, nocturia, numbness, tingling, seizures,  Focal weakness, Loss of consciousness,  Tremor, insomnia, depression, anxiety, and suicidal ideation.      Objective:  BP 134/82   Pulse 75   Temp (!) 97.5 F (36.4 C) (Oral)   Ht 5' 9"$  (1.753 m)   Wt 199 lb 9.6 oz (90.5 kg)   SpO2 95%   BMI 29.48 kg/m   BP Readings from Last 3 Encounters:  12/20/22 134/82  10/17/22 124/86  06/23/22 130/80    Wt Readings from Last 3 Encounters:  12/20/22 199 lb 9.6 oz (90.5 kg)  10/17/22 194 lb 12.8 oz (88.4 kg)  06/23/22 188 lb (85.3 kg)    Physical Exam  Lab Results  Component Value Date   HGBA1C 5.9 06/23/2022   HGBA1C 5.8 07/29/2021   HGBA1C 5.7 01/26/2020    Lab Results  Component Value Date   CREATININE 1.21 06/23/2022   CREATININE 1.15 02/24/2022   CREATININE 1.08 01/02/2022    Lab Results  Component Value Date   WBC 7.8 01/13/2022   HGB 14.3 01/13/2022   HCT 42.7 01/13/2022   PLT 289.0 01/13/2022   GLUCOSE 89 06/23/2022  CHOL 162 01/02/2022   TRIG 53 01/02/2022   HDL 54 01/02/2022   LDLDIRECT 145.0 12/03/2015   LDLCALC 94 01/02/2022   ALT 17 06/23/2022   AST 17 06/23/2022   NA 138 06/23/2022   K 4.2 06/23/2022   CL 103 06/23/2022   CREATININE 1.21 06/23/2022   BUN 22 06/23/2022   CO2 27 06/23/2022   TSH 0.68 04/14/2022   PSA 0.40 06/23/2022   HGBA1C 5.9 06/23/2022   MICROALBUR <0.7 06/23/2022    No results found.  Assessment & Plan:  .There are no diagnoses linked to this encounter.   I provided 30 minutes of face-to-face time during this encounter reviewing patient's last visit with me, patient's  most recent visit with cardiology,  nephrology,  and neurology,  recent surgical and non surgical procedures, previous  labs and imaging studies, counseling on currently addressed issues,  and post  visit ordering to diagnostics and therapeutics .   Follow-up: No follow-ups on file.   Crecencio Mc, MD

## 2022-12-20 NOTE — Patient Instructions (Addendum)
Look up your medications on Pharmacist, hospital .  You may be able to save $$$$    Use steri strips for the wound to keep the edges close together '  Keep your wound covered,  change dressing whenever it gets wet.  flush wound with sterile saline solution (contact lens solution ok)  Start cephalexin  if drainage becomes green, or has an odor to it, or the area around it gets red  If you DO start cephalexin,  start a probiotic and call to have your wound looked at    If your labs are normal today,  we will reduce dose of prednisone to 18 mg

## 2022-12-21 DIAGNOSIS — S61219S Laceration without foreign body of unspecified finger without damage to nail, sequela: Secondary | ICD-10-CM | POA: Insufficient documentation

## 2022-12-21 MED ORDER — PREDNISONE 10 MG PO TABS: ORAL_TABLET | ORAL | 0 refills | Status: DC

## 2022-12-21 MED ORDER — PREDNISONE 1 MG PO TABS: ORAL_TABLET | ORAL | 0 refills | Status: DC

## 2022-12-21 NOTE — Assessment & Plan Note (Signed)
Currently wrapped tightly to manage bleeding.  Advise to monitor for signs of infection,  keep covered and clean wound daily with sterile saline

## 2022-12-21 NOTE — Assessment & Plan Note (Signed)
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  Recent infection of socket now resolved

## 2022-12-22 LAB — HLA-B27 ANTIGEN: HLA-B27 Antigen: NEGATIVE

## 2022-12-22 LAB — CYCLIC CITRUL PEPTIDE ANTIBODY, IGG: Cyclic Citrullin Peptide Ab: <16 UNITS

## 2022-12-22 LAB — RHEUMATOID FACTOR: Rheumatoid fact SerPl-aCnc: <14 IU/mL (ref <14)

## 2022-12-22 LAB — ANA: Anti Nuclear Antibody (ANA): NEGATIVE

## 2022-12-25 ENCOUNTER — Telehealth: Payer: Self-pay | Admitting: Internal Medicine

## 2022-12-25 NOTE — Telephone Encounter (Signed)
$'10mg'o$  received by [facility] '1mg'$  not received - called in

## 2022-12-25 NOTE — Telephone Encounter (Signed)
Pt need a refill on predniSONE '1mg'$  sent to [facility]

## 2023-03-03 ENCOUNTER — Other Ambulatory Visit: Payer: Self-pay | Admitting: Internal Medicine

## 2023-03-05 ENCOUNTER — Encounter: Payer: Self-pay | Admitting: Family

## 2023-03-05 ENCOUNTER — Ambulatory Visit (INDEPENDENT_AMBULATORY_CARE_PROVIDER_SITE_OTHER): Payer: Self-pay | Admitting: Family

## 2023-03-05 VITALS — BP 132/82 | HR 70 | Temp 98.0°F | Ht 69.0 in | Wt 198.0 lb

## 2023-03-05 DIAGNOSIS — M25461 Effusion, right knee: Secondary | ICD-10-CM

## 2023-03-05 DIAGNOSIS — M353 Polymyalgia rheumatica: Secondary | ICD-10-CM

## 2023-03-05 DIAGNOSIS — M25561 Pain in right knee: Secondary | ICD-10-CM

## 2023-03-05 MED ORDER — PREDNISONE 10 MG PO TABS
ORAL_TABLET | ORAL | 0 refills | Status: DC
Start: 2023-03-05 — End: 2023-04-02

## 2023-03-05 NOTE — Telephone Encounter (Signed)
Refilled: 12/21/2022 Last OV: 12/20/2022 Next OV: today with Claris Che, NP

## 2023-03-05 NOTE — Progress Notes (Unsigned)
Assessment & Plan:  There are no diagnoses linked to this encounter.   Return precautions given.   Risks, benefits, and alternatives of the medications and treatment plan prescribed today were discussed, and patient expressed understanding.   Education regarding symptom management and diagnosis given to patient on AVS either electronically or printed.  No follow-ups on file.  Rennie Plowman, FNP  Subjective:    Patient ID: Tim Owen, male    DOB: 1966-03-04, 57 y.o.   MRN: [id number]  CC: Tim Owen is a 57 y.o. male who presents today for an acute visit.    HPI: Accompanied by wife.   Right knee swelling x 6 days.   He was working 6 days ago and he walked into a jack stand with right knee and describes swollen fluid which felt like a 'knee pad'.   It has not been taking bothersome except when he is on his knees crawling for work , it is uncomfortable which is how we noticed  He works under [employer] .   No fever, knee pain, numbness in legs.   He hasn't tried icing, NSAIDs.        No h/o ckd  Smoker  H/o PMR He is taking 15mg  prednisone , decreased from 18mg , which he completed 03/03/23. He is planning to stay on the 15mg  qd.   Allergies: Latex and Codeine Current Outpatient Medications on File Prior to Visit  Medication Sig Dispense Refill   cephALEXin (KEFLEX) 500 MG capsule Take 1 capsule (500 mg total) by mouth 4 (four) times daily. 28 capsule 0   esomeprazole (NEXIUM) 20 MG packet Take 20 mg by mouth daily before breakfast.     losartan (COZAAR) 50 MG tablet TAKE 1 TABLET BY MOUTH DAILY 90 tablet 1   predniSONE (DELTASONE) 1 MG tablet Take 3 tablets  plus 1.5 tablets of 10 mg tablet  daily (total 18 mg daily) 90 tablet 0   predniSONE (DELTASONE) 10 MG tablet Take 1.5 tablets (15 mg ) plus 3  1 mg tablets daily (18 mg daily total) 45 tablet 0   SYNTHROID 150 MCG tablet Take 1 tablet (150 mcg total) by mouth daily before breakfast. 90 tablet 1    tiZANidine (ZANAFLEX) 4 MG tablet TAKE 1 TABLET BY MOUTH EVERY SIX HOURS AS NEEDED FOR MUSCLE SPASMS 30 tablet 5   [DISCONTINUED] magic mouthwash SOLN Take 5 mLs by mouth 3 (three) times daily as needed for mouth pain. 100 mL 0   Current Facility-Administered Medications on File Prior to Visit  Medication Dose Route Frequency Provider Last Rate Last Admin   lidocaine (XYLOCAINE) 2 % (with pres) injection 30 mg  1.5 mL Other Once Sherlene Shams, MD       triamcinolone acetonide (KENALOG-40) injection 20 mg  20 mg Intra-articular Once Sherlene Shams, MD        Review of Systems  Constitutional:  Negative for chills and fever.  Respiratory:  Negative for cough.   Cardiovascular:  Negative for chest pain, palpitations and leg swelling.  Gastrointestinal:  Negative for nausea and vomiting.  Musculoskeletal:  Positive for arthralgias and joint swelling.  Neurological:  Negative for numbness.      Objective:    BP 132/82   Pulse 70   Temp 98 F (36.7 C) (Oral)   Ht 5\' 9"  (1.753 m)   Wt 198 lb (89.8 kg)   SpO2 96%   BMI 29.24 kg/m   BP Readings from Last  3 Encounters:  03/05/23 132/82  12/20/22 134/82  10/17/22 124/86   Wt Readings from Last 3 Encounters:  03/05/23 198 lb (89.8 kg)  12/20/22 199 lb 9.6 oz (90.5 kg)  10/17/22 194 lb 12.8 oz (88.4 kg)    Physical Exam Vitals reviewed.  Constitutional:      Appearance: He is well-developed.  Cardiovascular:     Rate and Rhythm: Regular rhythm.     Heart sounds: Normal heart sounds.     Comments: Localized swelling over right patella, without fluctuation Pulmonary:     Effort: Pulmonary effort is normal. No respiratory distress.     Breath sounds: Normal breath sounds. No wheezing, rhonchi or rales.  Musculoskeletal:     Right knee: Swelling and effusion present. No bony tenderness. Normal range of motion. No tenderness.     Left knee: No effusion or bony tenderness. Normal range of motion. No tenderness.     Right  lower leg: No edema.     Left lower leg: No edema.       Legs:  Skin:    General: Skin is warm and dry.  Neurological:     Mental Status: He is alert.  Psychiatric:        Speech: Speech normal.        Behavior: Behavior normal.

## 2023-03-05 NOTE — Assessment & Plan Note (Addendum)
Improved significantly without treatment at home.  C/w prepatellar bursitis related to injury.   In the absence of pain, decreased ROM, increased warmth, heat or systemic features including fever to suggest infection, counseled on supportive care including Ace wrap icing regimen.  If persist, will obtain x-ray . he will let me know how he is doing

## 2023-03-06 NOTE — Patient Instructions (Signed)
I would recommend icing for 20 minutes twice daily.  Ace wrap.  If you not continue to see significant reduction in swelling, please let me know right away so we can pursue imaging, orthopedic consult

## 2023-03-30 ENCOUNTER — Other Ambulatory Visit: Payer: Self-pay | Admitting: Family

## 2023-03-30 DIAGNOSIS — M353 Polymyalgia rheumatica: Secondary | ICD-10-CM

## 2023-03-30 NOTE — Telephone Encounter (Signed)
Refilled: 03/05/2023 Last OV: 03/05/2023 Next OV: 04/30/2023

## 2023-04-02 ENCOUNTER — Other Ambulatory Visit: Payer: Self-pay | Admitting: Internal Medicine

## 2023-04-02 ENCOUNTER — Encounter: Payer: Self-pay | Admitting: Internal Medicine

## 2023-04-02 DIAGNOSIS — M353 Polymyalgia rheumatica: Secondary | ICD-10-CM

## 2023-04-26 ENCOUNTER — Encounter: Payer: Self-pay | Admitting: Dermatology

## 2023-04-26 ENCOUNTER — Ambulatory Visit (INDEPENDENT_AMBULATORY_CARE_PROVIDER_SITE_OTHER): Payer: Self-pay | Admitting: Dermatology

## 2023-04-26 VITALS — BP 133/82

## 2023-04-26 DIAGNOSIS — L570 Actinic keratosis: Secondary | ICD-10-CM

## 2023-04-26 DIAGNOSIS — Z1283 Encounter for screening for malignant neoplasm of skin: Secondary | ICD-10-CM

## 2023-04-26 DIAGNOSIS — D692 Other nonthrombocytopenic purpura: Secondary | ICD-10-CM

## 2023-04-26 DIAGNOSIS — D229 Melanocytic nevi, unspecified: Secondary | ICD-10-CM

## 2023-04-26 DIAGNOSIS — L821 Other seborrheic keratosis: Secondary | ICD-10-CM

## 2023-04-26 DIAGNOSIS — L578 Other skin changes due to chronic exposure to nonionizing radiation: Secondary | ICD-10-CM

## 2023-04-26 DIAGNOSIS — W908XXA Exposure to other nonionizing radiation, initial encounter: Secondary | ICD-10-CM

## 2023-04-26 DIAGNOSIS — B079 Viral wart, unspecified: Secondary | ICD-10-CM

## 2023-04-26 DIAGNOSIS — L814 Other melanin hyperpigmentation: Secondary | ICD-10-CM

## 2023-04-26 DIAGNOSIS — D1801 Hemangioma of skin and subcutaneous tissue: Secondary | ICD-10-CM

## 2023-04-26 NOTE — Patient Instructions (Signed)
Due to recent changes in healthcare laws, you may see results of your pathology and/or laboratory studies on MyChart before the doctors have had a chance to review them. We understand that in some cases there may be results that are confusing or concerning to you. Please understand that not all results are received at the same time and often the doctors may need to interpret multiple results in order to provide you with the best plan of care or course of treatment. Therefore, we ask that you please give us 2 business days to thoroughly review all your results before contacting the office for clarification. Should we see a critical lab result, you will be contacted sooner.   If You Need Anything After Your Visit  If you have any questions or concerns for your doctor, please call our main line at [phone] and press option 4 to reach your doctor's medical assistant. If no one answers, please leave a voicemail as directed and we will return your call as soon as possible. Messages left after 4 pm will be answered the following business day.   You may also send us a message via MyChart. We typically respond to MyChart messages within 1-2 business days.  For prescription refills, please ask your pharmacy to contact our office. Our fax number is [phone].  If you have an urgent issue when the clinic is closed that cannot wait until the next business day, you can page your doctor at the number below.    Please note that while we do our best to be available for urgent issues outside of office hours, we are not available 24/7.   If you have an urgent issue and are unable to reach us, you may choose to seek medical care at your doctor's office, retail clinic, urgent care center, or emergency room.  If you have a medical emergency, please immediately call 911 or go to the emergency department.  Pager Numbers  - Dr. Kowalski: [phone]  - Dr. Moye: [phone]  - Dr. Stewart:  [phone]  In the event of inclement weather, please call our main line at [phone] for an update on the status of any delays or closures.  Dermatology Medication Tips: Please keep the boxes that topical medications come in in order to help keep track of the instructions about where and how to use these. Pharmacies typically print the medication instructions only on the boxes and not directly on the medication tubes.   If your medication is too expensive, please contact our office at [phone] option 4 or send us a message through MyChart.   We are unable to tell what your co-pay for medications will be in advance as this is different depending on your insurance coverage. However, we may be able to find a substitute medication at lower cost or fill out paperwork to get insurance to cover a needed medication.   If a prior authorization is required to get your medication covered by your insurance company, please allow us 1-2 business days to complete this process.  Drug prices often vary depending on where the prescription is filled and some pharmacies may offer cheaper prices.  The website www.goodrx.com contains coupons for medications through different pharmacies. The prices here do not account for what the cost may be with help from insurance (it may be cheaper with your insurance), but the website can give you the price if you did not use any insurance.  - You can print the associated coupon and take it with   your prescription to the pharmacy.  - You may also stop by our office during regular business hours and pick up a GoodRx coupon card.  - If you need your prescription sent electronically to a different pharmacy, notify our office through [facility] MyChart or by phone at [phone] option 4.     Si Usted Necesita Algo Despus de Su Visita  Tambin puede enviarnos un mensaje a travs de MyChart. Por lo general respondemos a los mensajes de MyChart en el transcurso de 1 a 2  das hbiles.  Para renovar recetas, por favor pida a su farmacia que se ponga en contacto con nuestra oficina. Nuestro nmero de fax es el [phone].  Si tiene un asunto urgente cuando la clnica est cerrada y que no puede esperar hasta el siguiente da hbil, puede llamar/localizar a su doctor(a) al nmero que aparece a continuacin.   Por favor, tenga en cuenta que aunque hacemos todo lo posible para estar disponibles para asuntos urgentes fuera del horario de oficina, no estamos disponibles las 24 horas del da, los 7 das de la semana.   Si tiene un problema urgente y no puede comunicarse con nosotros, puede optar por buscar atencin mdica  en el consultorio de su doctor(a), en una clnica privada, en un centro de atencin urgente o en una sala de emergencias.  Si tiene una emergencia mdica, por favor llame inmediatamente al 911 o vaya a la sala de emergencias.  Nmeros de bper  - Dr. Kowalski: [phone]  - Dra. Moye: [phone]  - Dra. Stewart: [phone]  En caso de inclemencias del tiempo, por favor llame a nuestra lnea principal al [phone] para una actualizacin sobre el estado de cualquier retraso o cierre.  Consejos para la medicacin en dermatologa: Por favor, guarde las cajas en las que vienen los medicamentos de uso tpico para ayudarle a seguir las instrucciones sobre dnde y cmo usarlos. Las farmacias generalmente imprimen las instrucciones del medicamento slo en las cajas y no directamente en los tubos del medicamento.   Si su medicamento es muy caro, por favor, pngase en contacto con nuestra oficina llamando al [phone] y presione la opcin 4 o envenos un mensaje a travs de MyChart.   No podemos decirle cul ser su copago por los medicamentos por adelantado ya que esto es diferente dependiendo de la cobertura de su seguro. Sin embargo, es posible que podamos encontrar un medicamento sustituto a menor costo o llenar un formulario para que el  seguro cubra el medicamento que se considera necesario.   Si se requiere una autorizacin previa para que su compaa de seguros cubra su medicamento, por favor permtanos de 1 a 2 das hbiles para completar este proceso.  Los precios de los medicamentos varan con frecuencia dependiendo del lugar de dnde se surte la receta y alguna farmacias pueden ofrecer precios ms baratos.  El sitio web www.goodrx.com tiene cupones para medicamentos de diferentes farmacias. Los precios aqu no tienen en cuenta lo que podra costar con la ayuda del seguro (puede ser ms barato con su seguro), pero el sitio web puede darle el precio si no utiliz ningn seguro.  - Puede imprimir el cupn correspondiente y llevarlo con su receta a la farmacia.  - Tambin puede pasar por nuestra oficina durante el horario de atencin regular y recoger una tarjeta de cupones de GoodRx.  - Si necesita que su receta se enve electrnicamente a una farmacia diferente, informe a nuestra oficina a travs de MyChart de    o por telfono llamando al [phone] y presione la opcin 4.  

## 2023-04-26 NOTE — Progress Notes (Signed)
Follow-Up Visit   Subjective  Tim Owen is a 57 y.o. male who presents for the following: Skin Cancer Screening and Full Body Skin Exam, check spots neck, leg  Patient accompanied by wife.  The patient presents for Total-Body Skin Exam (TBSE) for skin cancer screening and mole check. The patient has spots, moles and lesions to be evaluated, some may be new or changing and the patient has concerns that these could be cancer.    The following portions of the chart were reviewed this encounter and updated as appropriate: medications, allergies, medical history  Review of Systems:  No other skin or systemic complaints except as noted in HPI or Assessment and Plan.  Objective  Well appearing patient in no apparent distress; mood and affect are within normal limits.  A full examination was performed including scalp, head, eyes, ears, nose, lips, neck, chest, axillae, abdomen, back, buttocks, bilateral upper extremities, bilateral lower extremities, hands, feet, fingers, toes, fingernails, and toenails. All findings within normal limits unless otherwise noted below.   Relevant physical exam findings are noted in the Assessment and Plan.  L cheek x 1, L ear x 1 (2) Pink scaly macules  L elbow Verrucous papules -- Discussed viral etiology and contagion.     Assessment & Plan   LENTIGINES, SEBORRHEIC KERATOSES, HEMANGIOMAS - Benign normal skin lesions - Benign-appearing - Call for any changes  MELANOCYTIC NEVI - Tan-brown and/or pink-flesh-colored symmetric macules and papules - Benign appearing on exam today - Observation - Call clinic for new or changing moles - Recommend daily use of broad spectrum spf 30+ sunscreen to sun-exposed areas.   ACTINIC DAMAGE - Chronic condition, secondary to cumulative UV/sun exposure - diffuse scaly erythematous macules with underlying dyspigmentation - Recommend daily broad spectrum sunscreen SPF 30+ to sun-exposed areas, reapply every  2 hours as needed.  - Staying in the shade or wearing long sleeves, sun glasses (UVA+UVB protection) and wide brim hats (4-inch brim around the entire circumference of the hat) are also recommended for sun protection.  - Call for new or changing lesions.  SKIN CANCER SCREENING PERFORMED TODAY.  Purpura - Chronic; persistent and recurrent.  Treatable, but not curable. - Violaceous macules and patches - Benign - Related to trauma, age, sun damage and/or use of blood thinners, chronic use of topical and/or oral steroids - Observe - Can use OTC arnica containing moisturizer such as Dermend Bruise Formula if desired - Call for worsening or other concerns   AK (actinic keratosis) (2) L cheek x 1, L ear x 1  Destruction of lesion - L cheek x 1, L ear x 1 Complexity: simple   Destruction method: cryotherapy   Informed consent: discussed and consent obtained   Timeout:  patient name, date of birth, surgical site, and procedure verified Lesion destroyed using liquid nitrogen: Yes   Region frozen until ice ball extended beyond lesion: Yes   Outcome: patient tolerated procedure well with no complications   Post-procedure details: wound care instructions given    Viral warts, unspecified type L elbow  Viral Wart (HPV) Counseling  Discussed viral / HPV (Human Papilloma Virus) etiology and risk of spread /infectivity to other areas of body as well as to other people.  Multiple treatments and methods may be required to clear warts and it is possible treatment may not be successful.  Treatment risks include discoloration; scarring and there is still potential for wart recurrence.   No treatment today   Return in about  1 year (around 04/25/2024) for TBSE, Hx of AKs.  I, Ardis Rowan, RMA, am acting as scribe for Armida Sans, MD .   Documentation: I have reviewed the above documentation for accuracy and completeness, and I agree with the above.  Armida Sans, MD

## 2023-04-27 ENCOUNTER — Encounter: Payer: Self-pay | Admitting: Dermatology

## 2023-04-30 ENCOUNTER — Encounter: Payer: Self-pay | Admitting: Internal Medicine

## 2023-04-30 ENCOUNTER — Ambulatory Visit (INDEPENDENT_AMBULATORY_CARE_PROVIDER_SITE_OTHER): Payer: Self-pay | Admitting: Internal Medicine

## 2023-04-30 VITALS — BP 130/80 | HR 90 | Temp 97.5°F | Ht 69.0 in | Wt 205.4 lb

## 2023-04-30 DIAGNOSIS — M353 Polymyalgia rheumatica: Secondary | ICD-10-CM

## 2023-04-30 DIAGNOSIS — Z79899 Other long term (current) drug therapy: Secondary | ICD-10-CM

## 2023-04-30 DIAGNOSIS — Z970 Presence of artificial eye: Secondary | ICD-10-CM

## 2023-04-30 DIAGNOSIS — R197 Diarrhea, unspecified: Secondary | ICD-10-CM

## 2023-04-30 DIAGNOSIS — R7303 Prediabetes: Secondary | ICD-10-CM

## 2023-04-30 DIAGNOSIS — Z716 Tobacco abuse counseling: Secondary | ICD-10-CM

## 2023-04-30 DIAGNOSIS — E039 Hypothyroidism, unspecified: Secondary | ICD-10-CM

## 2023-04-30 DIAGNOSIS — I10 Essential (primary) hypertension: Secondary | ICD-10-CM

## 2023-04-30 DIAGNOSIS — D492 Neoplasm of unspecified behavior of bone, soft tissue, and skin: Secondary | ICD-10-CM

## 2023-04-30 LAB — COMPREHENSIVE METABOLIC PANEL
ALT: 30 U/L (ref 0–53)
AST: 19 U/L (ref 0–37)
Albumin: 4.3 g/dL (ref 3.5–5.2)
Alkaline Phosphatase: 39 U/L (ref 39–117)
BUN: 16 mg/dL (ref 6–23)
CO2: 26 mEq/L (ref 19–32)
Calcium: 9.6 mg/dL (ref 8.4–10.5)
Chloride: 107 mEq/L (ref 96–112)
Creatinine, Ser: 1.14 mg/dL (ref 0.40–1.50)
GFR: 71.11 mL/min (ref >60.00)
Glucose, Bld: 83 mg/dL (ref 70–99)
Potassium: 3.6 mEq/L (ref 3.5–5.1)
Sodium: 141 mEq/L (ref 135–145)
Total Bilirubin: 0.4 mg/dL (ref 0.2–1.2)
Total Protein: 6.5 g/dL (ref 6.0–8.3)

## 2023-04-30 LAB — SEDIMENTATION RATE: Sed Rate: 5 mm/hr (ref 0–20)

## 2023-04-30 LAB — C-REACTIVE PROTEIN: CRP: <1 mg/dL (ref 0.5–20.0)

## 2023-04-30 LAB — HEMOGLOBIN A1C: Hgb A1c MFr Bld: 5.8 % (ref 4.6–6.5)

## 2023-04-30 LAB — TSH: TSH: 0.52 u[IU]/mL (ref 0.35–5.50)

## 2023-04-30 MED ORDER — PREDNISONE 1 MG PO TABS
2.0000 mg | ORAL_TABLET | Freq: Every day | ORAL | 2 refills | Status: DC
Start: 2023-04-30 — End: 2023-08-03

## 2023-04-30 MED ORDER — TIZANIDINE HCL 4 MG PO TABS: ORAL_TABLET | ORAL | 5 refills | Status: DC

## 2023-04-30 MED ORDER — PREDNISONE 10 MG PO TABS: 10.0000 mg | ORAL_TABLET | Freq: Every day | ORAL | 0 refills | Status: DC
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
## 2023-04-30 NOTE — Assessment & Plan Note (Signed)
counselled on the need to reduce sodium in diet. No changes today based on home readings of 130/.80 and lower
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
## 2023-04-30 NOTE — Patient Instructions (Signed)
Decrease the prednisone from 15 mg daily to 12 mg daily.  New rx sent   The diarrhea sounds like IBS or diet related, not due to infection.    Add citrucel or benefiber daily to help "bulk up" your stools.  Try eating Less green  beans and more legumes (fiber) You can use Imodium before a meal if needed    Stop the nighttime oatmeal cookies.  Try soaking "rolled oats"   (the old fashioned kind ) in 2% milk overnight instead of cooking it.  Then have it as a snack at night with fresh blueberries

## 2023-04-30 NOTE — Assessment & Plan Note (Addendum)
Steroid dependent  since May 2023 with a brief period of suspension.  He is pain free on 15 mg daily.  Reducing dose to 12 mg and rheum referral discussed and in progress   Lab Results  Component Value Date   ESRSEDRATE 5 04/30/2023

## 2023-04-30 NOTE — Assessment & Plan Note (Signed)
Functional by history without warning symptoms.  IBS suspected vs diet related . Screening for pathogens recommended

## 2023-05-01 ENCOUNTER — Other Ambulatory Visit: Payer: Self-pay | Admitting: Internal Medicine

## 2023-05-01 DIAGNOSIS — M353 Polymyalgia rheumatica: Secondary | ICD-10-CM

## 2023-05-01 MED ORDER — PREDNISONE 10 MG PO TABS: 10.0000 mg | ORAL_TABLET | Freq: Every day | ORAL | 0 refills | Status: DC

## 2023-05-01 NOTE — Telephone Encounter (Signed)
Patient called stating that the pharmacy told them they did not receive the refill on the prednisone 10 mg and 1 mg. Please call the pharmacy. I told the medication was sent . The patient did receive his muscle relaxer.

## 2023-05-01 NOTE — Telephone Encounter (Signed)
10 mg prednisone has has been sent AGAIN TO PHARMACY

## 2023-05-03 NOTE — Assessment & Plan Note (Signed)
He has a chronic cough.  He declines lung CT.  Reduction /quitting recommended .  Chantix offered but deferred

## 2023-05-03 NOTE — Assessment & Plan Note (Signed)
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  

## 2023-05-04 ENCOUNTER — Telehealth: Payer: Self-pay

## 2023-05-04 NOTE — Telephone Encounter (Signed)
Patient states his pharmacy ([facility]) did receive the prescription for predniSONE (DELTASONE) 10 MG tablet, but they did not received the prescription for this medication in a 1 MG dose.  Patient states he is supposed to take 12 MG, but since he does not have the 1 MG tablets, he is still taking 15 MG dose.

## 2023-05-14 ENCOUNTER — Other Ambulatory Visit: Payer: Self-pay | Admitting: Internal Medicine

## 2023-07-25 ENCOUNTER — Other Ambulatory Visit: Payer: Self-pay | Admitting: Internal Medicine

## 2023-08-02 ENCOUNTER — Ambulatory Visit: Payer: Self-pay | Admitting: Internal Medicine

## 2023-08-03 ENCOUNTER — Ambulatory Visit (INDEPENDENT_AMBULATORY_CARE_PROVIDER_SITE_OTHER): Payer: Self-pay | Admitting: Internal Medicine

## 2023-08-03 ENCOUNTER — Encounter: Payer: Self-pay | Admitting: Internal Medicine

## 2023-08-03 VITALS — BP 132/78 | HR 67 | Ht 69.0 in | Wt 210.6 lb

## 2023-08-03 DIAGNOSIS — Z716 Tobacco abuse counseling: Secondary | ICD-10-CM

## 2023-08-03 DIAGNOSIS — Z125 Encounter for screening for malignant neoplasm of prostate: Secondary | ICD-10-CM

## 2023-08-03 DIAGNOSIS — M1990 Unspecified osteoarthritis, unspecified site: Secondary | ICD-10-CM

## 2023-08-03 NOTE — Patient Instructions (Signed)
Congratulations on quitting smoking!   You need to have your annual PSA done (prostate cancer screening test)    The current guidelines from the [place] Preventive Service Task  Force  for lung cancer screening recommend an annual CT if a patient has a 35 pack year history of smoking  and are either currently smoking or have  quit within the last 15 years .   Please let me know if you decide you want to have this done

## 2023-08-03 NOTE — Progress Notes (Unsigned)
Subjective:  Patient ID: Tim Owen, male    DOB: 07-Jan-1966  Age: 57 y.o. MRN: [id number]  CC: {There were no encounter diagnoses. (Refresh or delete this SmartLink)}   HPI Tim Owen presents for  Chief Complaint  Patient presents with   Medical Management of Chronic Issues    3 month follow up    1) Inflammatory arthritis:  "PMR vs other"  seeing rheum.  Last appt with Pyrtle sept 24:  methotrexate stopped due to s/e of cough and severe indigestion resulting in vommiting in spite of nexium)  and prednisonee weaned from 7.5 mg every 4 weeks by 2.5 mg . Currently on 5 mg .  So far so good,  only right knee is bothering him but he has a history of fall .  Chest x ray was normal per patient. Has 2 month follow up  2) tobacco abuse:  quit August 25 because of a constant cough .and used the highest dose nicotine patch which caused headache   Has gained 5 lbs .  Reviewed the Guidelines for lung ca screening recommend screening any individual with a 35 or higher pack year history and history of smoking within 15 yrs.  HE DECLINES REFERRAL FOR LUNG CA SCREENING     3) HTN:  reading in late wsept was 124/82. Taking cozaar 50 mg  HOME READINGS ARE BETTER,  < 130/80  4) Hypothyroid last tsh was 0.5 in July   5) allergic rhinitis today from cutting the grass and weed eating .    6)    Outpatient Medications Prior to Visit  Medication Sig Dispense Refill   esomeprazole (NEXIUM) 20 MG packet Take 20 mg by mouth daily before breakfast.     loratadine (CLARITIN) 10 MG tablet Take 10 mg by mouth daily.     losartan (COZAAR) 50 MG tablet TAKE 1 TABLET BY MOUTH DAILY 90 tablet 1   predniSONE (DELTASONE) 10 MG tablet Take 1 tablet (10 mg total) by mouth daily with breakfast. With 2 mg additional (Patient taking differently: Take 5 mg by mouth daily with breakfast.) 90 tablet 0   SYNTHROID 150 MCG tablet TAKE ONE TABLET BY MOUTH EVERY DAY BEFORE BREAKFAST 90 tablet 1   tiZANidine (ZANAFLEX) 4  MG tablet TAKE 1 TABLET BY MOUTH EVERY SIX HOURS AS NEEDED FOR MUSCLE SPASMS 30 tablet 5   predniSONE (DELTASONE) 1 MG tablet Take 2 tablets (2 mg total) by mouth daily with breakfast. Take with 10 mg tablet 60 tablet 2   Facility-Administered Medications Prior to Visit  Medication Dose Route Frequency Provider Last Rate Last Admin   lidocaine (XYLOCAINE) 2 % (with pres) injection 30 mg  1.5 mL Other Once Sherlene Shams, MD       triamcinolone acetonide (KENALOG-40) injection 20 mg  20 mg Intra-articular Once Sherlene Shams, MD        Review of Systems;  Patient denies headache, fevers, malaise, unintentional weight loss, skin rash, eye pain, sinus congestion and sinus pain, sore throat, dysphagia,  hemoptysis , cough, dyspnea, wheezing, chest pain, palpitations, orthopnea, edema, abdominal pain, nausea, melena, diarrhea, constipation, flank pain, dysuria, hematuria, urinary  Frequency, nocturia, numbness, tingling, seizures,  Focal weakness, Loss of consciousness,  Tremor, insomnia, depression, anxiety, and suicidal ideation.      Objective:  BP 132/78   Pulse 67   Ht 5\' 9"  (1.753 m)   Wt 210 lb 9.6 oz (95.5 kg)   SpO2 96%   BMI  31.10 kg/m   BP Readings from Last 3 Encounters:  08/03/23 132/78  04/30/23 130/80  04/26/23 133/82    Wt Readings from Last 3 Encounters:  08/03/23 210 lb 9.6 oz (95.5 kg)  04/30/23 205 lb 6.4 oz (93.2 kg)  03/05/23 198 lb (89.8 kg)    Physical Exam  Lab Results  Component Value Date   HGBA1C 5.8 04/30/2023   HGBA1C 5.9 06/23/2022   HGBA1C 5.8 07/29/2021    Lab Results  Component Value Date   CREATININE 1.14 04/30/2023   CREATININE 1.21 06/23/2022   CREATININE 1.15 02/24/2022    Lab Results  Component Value Date   WBC 7.8 01/13/2022   HGB 14.3 01/13/2022   HCT 42.7 01/13/2022   PLT 289.0 01/13/2022   GLUCOSE 83 04/30/2023   CHOL 162 01/02/2022   TRIG 53 01/02/2022   HDL 54 01/02/2022   LDLDIRECT 145.0 12/03/2015   LDLCALC 94  01/02/2022   ALT 30 04/30/2023   AST 19 04/30/2023   NA 141 04/30/2023   K 3.6 04/30/2023   CL 107 04/30/2023   CREATININE 1.14 04/30/2023   BUN 16 04/30/2023   CO2 26 04/30/2023   TSH 0.52 04/30/2023   PSA 0.40 06/23/2022   HGBA1C 5.8 04/30/2023   MICROALBUR <0.7 06/23/2022    No results found.  Assessment & Plan:  .There are no diagnoses linked to this encounter.   I provided 30 minutes of face-to-face time during this encounter reviewing patient's last visit with me, patient's  most recent visit with cardiology,  nephrology,  and neurology,  recent surgical and non surgical procedures, previous  labs and imaging studies, counseling on currently addressed issues,  and post visit ordering to diagnostics and therapeutics .   Follow-up: No follow-ups on file.   Sherlene Shams, MD

## 2023-08-05 NOTE — Assessment & Plan Note (Addendum)
Now managed by Rheumatology, diagnosis unclear.  He is currently on a prlonged wean from prednisone

## 2023-08-05 NOTE — Assessment & Plan Note (Signed)
He has recently quit smoking .  He declines   referral  for lung CA screening.

## 2023-08-29 NOTE — Telephone Encounter (Signed)
Error

## 2023-10-16 ENCOUNTER — Ambulatory Visit (INDEPENDENT_AMBULATORY_CARE_PROVIDER_SITE_OTHER): Payer: Self-pay | Admitting: Internal Medicine

## 2023-10-16 ENCOUNTER — Encounter: Payer: Self-pay | Admitting: Internal Medicine

## 2023-10-16 VITALS — BP 130/88 | HR 54 | Ht 69.0 in | Wt 206.2 lb

## 2023-10-16 DIAGNOSIS — I10 Essential (primary) hypertension: Secondary | ICD-10-CM

## 2023-10-16 DIAGNOSIS — G8929 Other chronic pain: Secondary | ICD-10-CM

## 2023-10-16 DIAGNOSIS — M1990 Unspecified osteoarthritis, unspecified site: Secondary | ICD-10-CM

## 2023-10-16 DIAGNOSIS — M25511 Pain in right shoulder: Secondary | ICD-10-CM

## 2023-10-16 DIAGNOSIS — R748 Abnormal levels of other serum enzymes: Secondary | ICD-10-CM

## 2023-10-16 LAB — COMPREHENSIVE METABOLIC PANEL
ALT: 61 U/L — ABNORMAL HIGH (ref 0–53)
AST: 27 U/L (ref 0–37)
Albumin: 4.7 g/dL (ref 3.5–5.2)
Alkaline Phosphatase: 54 U/L (ref 39–117)
BUN: 17 mg/dL (ref 6–23)
CO2: 28 meq/L (ref 19–32)
Calcium: 9.8 mg/dL (ref 8.4–10.5)
Chloride: 103 meq/L (ref 96–112)
Creatinine, Ser: 1.22 mg/dL (ref 0.40–1.50)
GFR: 65.34 mL/min (ref >60.00)
Glucose, Bld: 83 mg/dL (ref 70–99)
Potassium: 4.4 meq/L (ref 3.5–5.1)
Sodium: 139 meq/L (ref 135–145)
Total Bilirubin: 0.8 mg/dL (ref 0.2–1.2)
Total Protein: 6.8 g/dL (ref 6.0–8.3)

## 2023-10-16 LAB — SEDIMENTATION RATE: Sed Rate: 8 mm/h (ref 0–20)

## 2023-10-16 LAB — C-REACTIVE PROTEIN: CRP: <1 mg/dL (ref 0.5–20.0)

## 2023-10-16 NOTE — Assessment & Plan Note (Signed)
Recommend seeing Dr Joice Lofts for evaluation

## 2023-10-16 NOTE — Assessment & Plan Note (Signed)
counselled on the need to reduce sodium in diet. No changes today based on home readings of 130/.80 and lower

## 2023-10-16 NOTE — Assessment & Plan Note (Addendum)
managed by Rheumatology,  until now, with  diagnosis unclear.  He was treated with methotrexate  but developed elevated liver enzymes .  He has been alcohol abstinent since the liver enzymes were checked.  Repeat levels today have improved .  Will potpone use of NSAID and repeat lfts  in 4 weeks.   a Lab Results  Component Value Date   ALT 61 (H) 10/16/2023   AST 27 10/16/2023   ALKPHOS 54 10/16/2023   BILITOT 0.8 10/16/2023

## 2023-10-16 NOTE — Progress Notes (Signed)
Subjective:  Patient ID: Tim Owen, male    DOB: [birth year]  Age: 57 y.o. MRN: [id number]  CC: The primary encounter diagnosis was Elevated liver enzymes. Diagnoses of Inflammatory arthritis, Essential hypertension, and Chronic right shoulder pain were also pertinent to this visit.   HPI Tim Owen presents for  Chief Complaint  Patient presents with   Medical Management of Chronic Issues    Follow up on muscle pain   1) inflammatory arthritis  seen by patel on Nov 25:  He is off the prednisone, methotrexate and gabapentin. He states that two weeks after he got off the medication he developed right shoulder pains then both feet started to hurt. He then developed right knee pain and swelling. He then developed left biceps pains. He has some stiffness of the shoulders with movement. He states that at night its the worse. He is using Voltaren Gel and taking Aleve.   He still feels like he has choking sensation despite being off the medications. He also complains of headaches. He has no joint pains or swelling of the hand joints.   Elevated liver enzymes noted on Nov 25:  occurred one day after drinking  in an excessive manner due to a death's friend.  Has not drnk since Nov 26.   Not using any nsaids currently,  and using tylenol  only prn    Notes reviewed ER was normal nov 25 but AST 48, ALT 111   Right shoulder hurting and bilateral plantar foot pain    Outpatient Medications Prior to Visit  Medication Sig Dispense Refill   esomeprazole (NEXIUM) 20 MG packet Take 20 mg by mouth daily before breakfast.     losartan (COZAAR) 50 MG tablet TAKE 1 TABLET BY MOUTH DAILY 90 tablet 1   SYNTHROID 150 MCG tablet TAKE ONE TABLET BY MOUTH EVERY DAY BEFORE BREAKFAST 90 tablet 1   tiZANidine (ZANAFLEX) 4 MG tablet TAKE 1 TABLET BY MOUTH EVERY SIX HOURS AS NEEDED FOR MUSCLE SPASMS 30 tablet 5   loratadine (CLARITIN) 10 MG tablet Take 10 mg by mouth daily. (Patient not taking: Reported on  10/16/2023)     predniSONE (DELTASONE) 10 MG tablet Take 1 tablet (10 mg total) by mouth daily with breakfast. With 2 mg additional (Patient not taking: Reported on 10/16/2023) 90 tablet 0   Facility-Administered Medications Prior to Visit  Medication Dose Route Frequency Provider Last Rate Last Admin   lidocaine (XYLOCAINE) 2 % (with pres) injection 30 mg  1.5 mL Other Once Sherlene Shams, MD       triamcinolone acetonide (KENALOG-40) injection 20 mg  20 mg Intra-articular Once Sherlene Shams, MD        Review of Systems;  Patient denies headache, fevers, malaise, unintentional weight loss, skin rash, eye pain, sinus congestion and sinus pain, sore throat, dysphagia,  hemoptysis , cough, dyspnea, wheezing, chest pain, palpitations, orthopnea, edema, abdominal pain, nausea, melena, diarrhea, constipation, flank pain, dysuria, hematuria, urinary  Frequency, nocturia, numbness, tingling, seizures,  Focal weakness, Loss of consciousness,  Tremor, insomnia, depression, anxiety, and suicidal ideation.      Objective:  BP 130/88   Pulse (!) 54   Ht 5\' 9"  (1.753 m)   Wt 206 lb 3.2 oz (93.5 kg)   SpO2 99%   BMI 30.45 kg/m   BP Readings from Last 3 Encounters:  10/16/23 130/88  08/03/23 132/78  04/30/23 130/80    Wt Readings from Last 3 Encounters:  10/16/23 206  lb 3.2 oz (93.5 kg)  08/03/23 210 lb 9.6 oz (95.5 kg)  04/30/23 205 lb 6.4 oz (93.2 kg)    Physical Exam Vitals reviewed.  Constitutional:      General: He is not in acute distress.    Appearance: Normal appearance. He is normal weight. He is not ill-appearing, toxic-appearing or diaphoretic.  HENT:     Head: Normocephalic.  Eyes:     General: No scleral icterus.       Right eye: No discharge.        Left eye: No discharge.     Conjunctiva/sclera: Conjunctivae normal.  Cardiovascular:     Rate and Rhythm: Normal rate and regular rhythm.     Heart sounds: Normal heart sounds.  Pulmonary:     Effort: Pulmonary  effort is normal. No respiratory distress.     Breath sounds: Normal breath sounds.  Musculoskeletal:        General: Normal range of motion.     Cervical back: Normal range of motion.  Skin:    General: Skin is warm and dry.     Coloration: Skin is not jaundiced.  Neurological:     General: No focal deficit present.     Mental Status: He is alert and oriented to person, place, and time. Mental status is at baseline.  Psychiatric:        Mood and Affect: Mood normal.        Behavior: Behavior normal.        Thought Content: Thought content normal.        Judgment: Judgment normal.    Lab Results  Component Value Date   HGBA1C 5.8 04/30/2023   HGBA1C 5.9 06/23/2022   HGBA1C 5.8 07/29/2021    Lab Results  Component Value Date   CREATININE 1.22 10/16/2023   CREATININE 1.14 04/30/2023   CREATININE 1.21 06/23/2022    Lab Results  Component Value Date   WBC 7.8 01/13/2022   HGB 14.3 01/13/2022   HCT 42.7 01/13/2022   PLT 289.0 01/13/2022   GLUCOSE 83 10/16/2023   CHOL 162 01/02/2022   TRIG 53 01/02/2022   HDL 54 01/02/2022   LDLDIRECT 145.0 12/03/2015   LDLCALC 94 01/02/2022   ALT 61 (H) 10/16/2023   AST 27 10/16/2023   NA 139 10/16/2023   K 4.4 10/16/2023   CL 103 10/16/2023   CREATININE 1.22 10/16/2023   BUN 17 10/16/2023   CO2 28 10/16/2023   TSH 0.52 04/30/2023   PSA 0.40 06/23/2022   HGBA1C 5.8 04/30/2023   MICROALBUR <0.7 06/23/2022    No results found.  Assessment & Plan:  .Elevated liver enzymes -     Comprehensive metabolic panel -     Comprehensive metabolic panel; Future  Inflammatory arthritis Assessment & Plan: managed by Rheumatology,  until now, with  diagnosis unclear.  He was treated with methotrexate  but developed elevated liver enzymes .  He has been alcohol abstinent since the liver enzymes were checked.  Repeat levels today have improved .  Will potpone use of NSAID and repeat lfts  in 4 weeks.   a Lab Results  Component Value Date    ALT 61 (H) 10/16/2023   AST 27 10/16/2023   ALKPHOS 54 10/16/2023   BILITOT 0.8 10/16/2023     Orders: -     Sedimentation rate -     C-reactive protein  Essential hypertension Assessment & Plan: counselled on the need to reduce sodium in diet. No  changes today based on home readings of 130/.80 and lower     Chronic right shoulder pain Assessment & Plan: Recommend seeing Dr Joice Lofts for evaluation       I provided 30 minutes of face-to-face time during this encounter reviewing patient's last visit with me, patient's  most recent visit with rheumatology, , previous  surgical and non surgical procedures, previous  labs and imaging studies, counseling on currently addressed issues,  and post visit ordering to diagnostics and therapeutics .   Follow-up: No follow-ups on file.   Sherlene Shams, MD

## 2023-10-16 NOTE — Patient Instructions (Addendum)
I recommend seeing Dr Joice Lofts at [facility] for your shoulder issues   For the throat issues:    Try using generic Zyrtec at bedtime (cetirizine)  If liver enzymes are normal, I recommend trying the celebrex,  up to 200 mg twice daily.    If it helps,  we will  l recheck liver enzymes  in 2 weeks

## 2023-10-17 ENCOUNTER — Encounter: Payer: Self-pay | Admitting: Internal Medicine

## 2023-11-06 ENCOUNTER — Other Ambulatory Visit: Payer: Self-pay | Admitting: Internal Medicine

## 2023-11-07 ENCOUNTER — Ambulatory Visit: Payer: Self-pay | Admitting: Internal Medicine

## 2023-11-20 ENCOUNTER — Other Ambulatory Visit (INDEPENDENT_AMBULATORY_CARE_PROVIDER_SITE_OTHER): Payer: Self-pay

## 2023-11-20 ENCOUNTER — Telehealth: Payer: Self-pay | Admitting: Internal Medicine

## 2023-11-20 ENCOUNTER — Encounter: Payer: Self-pay | Admitting: Internal Medicine

## 2023-11-20 DIAGNOSIS — R748 Abnormal levels of other serum enzymes: Secondary | ICD-10-CM

## 2023-11-20 LAB — COMPREHENSIVE METABOLIC PANEL
ALT: 30 U/L (ref 0–53)
AST: 17 U/L (ref 0–37)
Albumin: 4.6 g/dL (ref 3.5–5.2)
Alkaline Phosphatase: 55 U/L (ref 39–117)
BUN: 19 mg/dL (ref 6–23)
CO2: 28 meq/L (ref 19–32)
Calcium: 9.5 mg/dL (ref 8.4–10.5)
Chloride: 102 meq/L (ref 96–112)
Creatinine, Ser: 1.27 mg/dL (ref 0.40–1.50)
GFR: 62.22 mL/min (ref >60.00)
Glucose, Bld: 90 mg/dL (ref 70–99)
Potassium: 4.6 meq/L (ref 3.5–5.1)
Sodium: 138 meq/L (ref 135–145)
Total Bilirubin: 0.5 mg/dL (ref 0.2–1.2)
Total Protein: 6.7 g/dL (ref 6.0–8.3)

## 2023-11-26 ENCOUNTER — Encounter: Payer: Self-pay | Admitting: Internal Medicine

## 2023-11-27 NOTE — Addendum Note (Signed)
Addended by: Kristie Cowman on: 11/27/2023 08:53 AM   Modules accepted: Orders

## 2023-11-27 NOTE — Telephone Encounter (Signed)
Ordered Prescriptions Prescription Sig Dispense Quantity Refills Last Filled Start Date End Date  celecoxib (CELEBREX) 100 MG capsule  Indications: Undifferentiated inflammatory arthritis (CMS/HHS-HCC), Polymyalgia rheumatica (CMS/HHS-HCC) Take 1 capsule (100 mg total) by mouth 2 (two) times daily 60 capsule  3   09/24/2023

## 2023-11-28 ENCOUNTER — Other Ambulatory Visit: Payer: Self-pay | Admitting: Internal Medicine

## 2023-11-29 ENCOUNTER — Other Ambulatory Visit: Payer: Self-pay

## 2023-11-29 MED ORDER — CELECOXIB 100 MG PO CAPS: 200.0000 mg | ORAL_CAPSULE | Freq: Two times a day (BID) | ORAL | 2 refills | Status: DC

## 2023-11-29 NOTE — Telephone Encounter (Signed)
Copied from CRM [id number]. Topic: Clinical - Prescription Issue >> Nov 29, 2023  1:38 PM Adele Barthel wrote: Reason for CRM: Patient is calling in in regards to his refill of celecoxib (CELEBREX) 100 MG capsule. He states at his last visit with the provider on 10/16/2023, he was advised to begin taking 2 tablets twice a day; however, the prescription sent to pharmacy says 1 tablet twice a day. He reports he will run out of medication prior to 12/13/2023, when he can refill the medication again. He requests a call back at #[phone]

## 2023-11-29 NOTE — Addendum Note (Signed)
Addended by: Sandy Salaam on: 11/29/2023 02:24 PM   Modules accepted: Orders

## 2023-11-29 NOTE — Telephone Encounter (Signed)
Per last office note pt was advised that if his liver enzymes were normal her could try increasing the celebrex to 200 mg BID. Looking at the labs that were done 9 days ago his liver enzymes have returned to normal. I have pended the new rx for your approval.

## 2023-11-30 NOTE — Telephone Encounter (Signed)
Pt is aware that medication has been corrected and sent to pharmacy.

## 2023-12-25 ENCOUNTER — Other Ambulatory Visit: Payer: Self-pay | Admitting: Student

## 2023-12-25 DIAGNOSIS — G8929 Other chronic pain: Secondary | ICD-10-CM

## 2023-12-25 DIAGNOSIS — M7541 Impingement syndrome of right shoulder: Secondary | ICD-10-CM

## 2024-01-02 ENCOUNTER — Other Ambulatory Visit: Payer: Self-pay | Admitting: Student

## 2024-01-02 ENCOUNTER — Inpatient Hospital Stay: Admission: RE | Admit: 2024-01-02 | Payer: Self-pay | Source: Ambulatory Visit

## 2024-01-02 DIAGNOSIS — M7541 Impingement syndrome of right shoulder: Secondary | ICD-10-CM

## 2024-01-02 DIAGNOSIS — T1590XA Foreign body on external eye, part unspecified, unspecified eye, initial encounter: Secondary | ICD-10-CM

## 2024-01-02 DIAGNOSIS — G8929 Other chronic pain: Secondary | ICD-10-CM

## 2024-01-28 ENCOUNTER — Other Ambulatory Visit: Payer: Self-pay | Admitting: Internal Medicine

## 2024-02-01 ENCOUNTER — Ambulatory Visit (INDEPENDENT_AMBULATORY_CARE_PROVIDER_SITE_OTHER): Payer: Self-pay | Admitting: Internal Medicine

## 2024-02-01 ENCOUNTER — Encounter: Payer: Self-pay | Admitting: Internal Medicine

## 2024-02-01 VITALS — BP 124/86 | HR 76 | Temp 98.0°F | Ht 69.0 in | Wt 208.6 lb

## 2024-02-01 DIAGNOSIS — E785 Hyperlipidemia, unspecified: Secondary | ICD-10-CM

## 2024-02-01 DIAGNOSIS — R7303 Prediabetes: Secondary | ICD-10-CM

## 2024-02-01 DIAGNOSIS — M25511 Pain in right shoulder: Secondary | ICD-10-CM

## 2024-02-01 DIAGNOSIS — E039 Hypothyroidism, unspecified: Secondary | ICD-10-CM

## 2024-02-01 DIAGNOSIS — M1990 Unspecified osteoarthritis, unspecified site: Secondary | ICD-10-CM

## 2024-02-01 DIAGNOSIS — R252 Cramp and spasm: Secondary | ICD-10-CM

## 2024-02-01 DIAGNOSIS — G8929 Other chronic pain: Secondary | ICD-10-CM

## 2024-02-01 DIAGNOSIS — N528 Other male erectile dysfunction: Secondary | ICD-10-CM

## 2024-02-01 DIAGNOSIS — Z970 Presence of artificial eye: Secondary | ICD-10-CM

## 2024-02-01 DIAGNOSIS — I10 Essential (primary) hypertension: Secondary | ICD-10-CM

## 2024-02-01 LAB — COMPREHENSIVE METABOLIC PANEL WITH GFR
ALT: 38 U/L (ref 0–53)
AST: 26 U/L (ref 0–37)
Albumin: 4.8 g/dL (ref 3.5–5.2)
Alkaline Phosphatase: 61 U/L (ref 39–117)
BUN: 20 mg/dL (ref 6–23)
CO2: 28 meq/L (ref 19–32)
Calcium: 9.8 mg/dL (ref 8.4–10.5)
Chloride: 103 meq/L (ref 96–112)
Creatinine, Ser: 1.22 mg/dL (ref 0.40–1.50)
GFR: 65.2 mL/min (ref >60.00)
Glucose, Bld: 98 mg/dL (ref 70–99)
Potassium: 4.7 meq/L (ref 3.5–5.1)
Sodium: 139 meq/L (ref 135–145)
Total Bilirubin: 0.7 mg/dL (ref 0.2–1.2)
Total Protein: 7.3 g/dL (ref 6.0–8.3)

## 2024-02-01 LAB — TSH: TSH: 0.54 u[IU]/mL (ref 0.35–5.50)

## 2024-02-01 LAB — CK: Total CK: 183 U/L (ref 7–232)

## 2024-02-01 MED ORDER — TIZANIDINE HCL 4 MG PO TABS: ORAL_TABLET | ORAL | 5 refills | Status: AC

## 2024-02-01 MED ORDER — CELECOXIB 100 MG PO CAPS: 200.0000 mg | ORAL_CAPSULE | Freq: Two times a day (BID) | ORAL | 5 refills | Status: DC

## 2024-02-01 NOTE — Progress Notes (Signed)
 Subjective:  Patient ID: Tim Owen, male    DOB: 01/21/1966  Age: 58 y.o. 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 Diagnoses of Muscle cramping, Prediabetes, Hyperlipidemia LDL goal <100, Hypothyroidism (acquired), Essential hypertension, Other male erectile dysfunction, Chronic right shoulder pain, and Inflammatory arthritis were also pertinent to this visit.   HPI Sanjit Mcmichael Fluke presents for  Chief Complaint  Patient presents with   Follow-up    6 month follow up Patient has questions   1) inflammatory arthritis: last visit with Allena Katz was in November 2024,  he states that he is not returning .  He has been using celebrex 200 mg twice daily with good control of pain.. ESR and CRP were normal in Dec 2024  2) seeing Poggi for right rotator cuff tear, incomplete.  Had an I/a steroid injection in February 2025  by Poggi ,  ultrasound guided , which provided transient relief  for a month,   can't sleep on right shoulder anymore due to pain.  Sleeping 2 hours at a time   3) muscle cramps occurring nocturnally, 3-4 times per night In both legs. Has tried dill pickle juice daily,  [employer] electrolyte drinks.  Occurring in spite of Using tizazidine , has increased dose to multple times daily  worse on days he walks 3 miles or more.  Physically demanding job  climbs welds  grinds., lays floor   4) hypothyroid  4) prosthetic eye   5) HTN:  home readings are always 130/80 or less on current regimen   Outpatient Medications Prior to Visit  Medication Sig Dispense Refill   esomeprazole (NEXIUM) 20 MG packet Take 20 mg by mouth daily before breakfast.     losartan (COZAAR) 50 MG tablet TAKE 1 TABLET BY MOUTH DAILY 90 tablet 1   SYNTHROID 150 MCG tablet TAKE ONE TABLET BY MOUTH EVERY DAY BEFORE BREAKFAST 90 tablet 1   celecoxib (CELEBREX) 100 MG capsule Take 2 capsules (200 mg total) by mouth 2 (two) times daily. 120 capsule 2   tiZANidine  (ZANAFLEX) 4 MG tablet TAKE ONE TABLET BY MOUTH EVERY 6 HOURS AS NEEDED FOR MUSCLE SPASM 30 tablet 5   Facility-Administered Medications Prior to Visit  Medication Dose Route Frequency Provider Last Rate Last Admin   lidocaine (XYLOCAINE) 2 % (with pres) injection 30 mg  1.5 mL Other Once Sherlene Shams, MD       triamcinolone acetonide (KENALOG-40) injection 20 mg  20 mg Intra-articular Once Sherlene Shams, MD        Review of Systems;  Patient denies headache, fevers, malaise, unintentional weight loss, skin rash, eye pain, sinus congestion and sinus pain, sore throat, dysphagia,  hemoptysis , cough, dyspnea, wheezing, chest pain, palpitations, orthopnea, edema, abdominal pain, nausea, melena, diarrhea, constipation, flank pain, dysuria, hematuria, urinary  Frequency, nocturia, numbness, tingling, seizures,  Focal weakness, Loss of consciousness,  Tremor, depression, anxiety, and suicidal ideation.      Objective:  BP 124/86   Pulse 76   Temp 98 F (36.7 C) (Oral)   Ht 5\' 9"  (1.753 m)   Wt 208 lb 9.6 oz (94.6 kg)   SpO2 98%   BMI 30.80 kg/m   BP Readings from Last 3 Encounters:  02/01/24 124/86  10/16/23 130/88  08/03/23 132/78    Wt Readings from Last 3 Encounters:  02/01/24 208 lb 9.6 oz (94.6 kg)  10/16/23 206 lb 3.2 oz (93.5 kg)  08/03/23 210  lb 9.6 oz (95.5 kg)    Physical Exam Vitals reviewed.  Constitutional:      General: He is not in acute distress.    Appearance: Normal appearance. He is normal weight. He is not ill-appearing, toxic-appearing or diaphoretic.  HENT:     Head: Normocephalic.  Eyes:     General: No scleral icterus.       Right eye: No discharge.        Left eye: No discharge.     Conjunctiva/sclera: Conjunctivae normal.  Cardiovascular:     Rate and Rhythm: Normal rate and regular rhythm.     Heart sounds: Normal heart sounds.  Pulmonary:     Effort: Pulmonary effort is normal. No respiratory distress.     Breath sounds: Normal breath  sounds.  Musculoskeletal:        General: Normal range of motion.     Cervical back: Normal range of motion.  Skin:    General: Skin is warm and dry.  Neurological:     General: No focal deficit present.     Mental Status: He is alert and oriented to person, place, and time. Mental status is at baseline.  Psychiatric:        Mood and Affect: Mood normal.        Behavior: Behavior normal.        Thought Content: Thought content normal.        Judgment: Judgment normal.    Lab Results  Component Value Date   HGBA1C 5.8 04/30/2023   HGBA1C 5.9 06/23/2022   HGBA1C 5.8 07/29/2021    Lab Results  Component Value Date   CREATININE 1.22 02/01/2024   CREATININE 1.27 11/20/2023   CREATININE 1.22 10/16/2023    Lab Results  Component Value Date   WBC 7.8 01/13/2022   HGB 14.3 01/13/2022   HCT 42.7 01/13/2022   PLT 289.0 01/13/2022   GLUCOSE 98 02/01/2024   CHOL 162 01/02/2022   TRIG 53 01/02/2022   HDL 54 01/02/2022   LDLDIRECT 145.0 12/03/2015   LDLCALC 94 01/02/2022   ALT 38 02/01/2024   AST 26 02/01/2024   NA 139 02/01/2024   K 4.7 02/01/2024   CL 103 02/01/2024   CREATININE 1.22 02/01/2024   BUN 20 02/01/2024   CO2 28 02/01/2024   TSH 0.54 02/01/2024   PSA 0.40 06/23/2022   HGBA1C 5.8 04/30/2023   MICROALBUR <0.7 06/23/2022    No results found.  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     Muscle cramping Assessment & Plan: Aggravated by excessive exertion. And not relieved with muscle relaxers,  pickle juice etc.  Checking lytes and muscle enzyme.   Orders: -     CK  Prediabetes Assessment & Plan: His random glucose was not  elevated and his last  A1c suggested he is at risk for developing diabetes.  He has been following a low glycemic index diet and particpates regularly in an aerobic  exercise activity.   Lab Results  Component Value Date    HGBA1C 5.8 04/30/2023      Orders: -     Hemoglobin A1c; Future  Hyperlipidemia LDL goal <100 -     Lipid Panel w/reflex Direct LDL; Future  Hypothyroidism (acquired) -     TSH  Essential hypertension Assessment & Plan: counselled on the need to reduce sodium in diet. Continue 50 mg  losartan daily based on  home readings of 130/.80 and lower    Orders: -     Comprehensive metabolic panel with GFR  Other male erectile dysfunction -     Testosterone,Free and Total; Future  Chronic right shoulder pain Assessment & Plan: Managed with I/A injections bu Dr Joice Lofts for evaluation    Inflammatory arthritis Assessment & Plan: Previously referred to and managed by Rheumatology,  until now. [name]  He was treated with methotrexate  but developed elevated liver enzymes .[name]  Repeat levels today have improved .  Most recent ESR and CRP were normal .   Lab Results  Component Value Date   ALT 38 02/01/2024   AST 26 02/01/2024   ALKPHOS 61 02/01/2024   BILITOT 0.7 02/01/2024   Lab Results  Component Value Date   ESRSEDRATE 8 10/16/2023   Lab Results  Component Value Date   CRP <1.0 10/16/2023       Other orders -     tiZANidine HCl; TAKE ONE TABLET BY MOUTH EVERY 6 HOURS AS NEEDED FOR MUSCLE SPASM  Dispense: 90 tablet; Refill: 5 -     Celecoxib; Take 2 capsules (200 mg total) by mouth 2 (two) times daily.  Dispense: 120 capsule; Refill: 5     I spent 34 minutes on the day of this face to face encounter reviewing patient's  most recent visit with  rheumatology, orthopedics , prior relevant surgical and non surgical procedures, recent  labs and imaging studies,  reviewing the assessment and plan with patient, and post visit ordering and reviewing of  diagnostics and therapeutics with patient  .   Follow-up: Return in about 6 months (around 08/02/2024).   Sherlene Shams, MD

## 2024-02-01 NOTE — Assessment & Plan Note (Signed)
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  

## 2024-02-01 NOTE — Patient Instructions (Signed)
 I HAVE REFILLED THE CELEBREX 100 MG CAPSULES  FOR A TOTAL OF 4 DAILY IF NEEDED  CONTINUE PERIODIC BLOOD PRESSURE CHECKS AND NOTIFY ME IF READINGS ARE > 130/80

## 2024-02-03 ENCOUNTER — Encounter: Payer: Self-pay | Admitting: Internal Medicine

## 2024-02-03 NOTE — Assessment & Plan Note (Signed)
 His random glucose was not  elevated and his last  A1c suggested he is at risk for developing diabetes.  He has been following a low glycemic index diet and particpates regularly in an aerobic  exercise activity.   Lab Results  Component Value Date   HGBA1C 5.8 04/30/2023

## 2024-02-03 NOTE — Assessment & Plan Note (Signed)
 counselled on the need to reduce sodium in diet. Continue 50 mg losartan daily based on  home readings of 130/.80 and lower

## 2024-02-03 NOTE — Assessment & Plan Note (Signed)
 Previously referred to and managed by Rheumatology,  until now. [name]  He was treated with methotrexate  but developed elevated liver enzymes .[name]  Repeat levels today have improved .  Most recent ESR and CRP were normal .   Lab Results  Component Value Date   ALT 38 02/01/2024   AST 26 02/01/2024   ALKPHOS 61 02/01/2024   BILITOT 0.7 02/01/2024   Lab Results  Component Value Date   ESRSEDRATE 8 10/16/2023   Lab Results  Component Value Date   CRP <1.0 10/16/2023

## 2024-02-03 NOTE — Assessment & Plan Note (Signed)
 Aggravated by excessive exertion. And not relieved with muscle relaxers,  pickle juice etc.  Checking lytes and muscle enzyme.

## 2024-02-03 NOTE — Assessment & Plan Note (Signed)
 Managed with I/A injections bu Dr Joice Lofts for evaluation

## 2024-02-05 ENCOUNTER — Other Ambulatory Visit: Payer: Self-pay | Admitting: Internal Medicine

## 2024-03-08 ENCOUNTER — Encounter: Payer: Self-pay | Admitting: *Deleted

## 2024-03-08 ENCOUNTER — Ambulatory Visit
Admission: EM | Admit: 2024-03-08 | Discharge: 2024-03-08 | Disposition: A | Discharge status: Home / Self Care | Payer: Self-pay

## 2024-03-08 DIAGNOSIS — J069 Acute upper respiratory infection, unspecified: Secondary | ICD-10-CM

## 2024-03-08 LAB — POCT RAPID STREP A (OFFICE): Rapid Strep A Screen: NEGATIVE

## 2024-03-08 MED ORDER — AMOXICILLIN-POT CLAVULANATE 875-125 MG PO TABS: 1.0000 | ORAL_TABLET | Freq: Two times a day (BID) | ORAL | 0 refills | Status: DC

## 2024-03-08 NOTE — ED Provider Notes (Signed)
 Tim Owen    CSN: [id number] Arrival date & time: 03/08/24  4098      History   Chief Complaint Chief Complaint  Patient presents with   Sore Throat   Cough   Headache    HPI Tim Owen is a 58 y.o. male.   Patient presents for evaluation of sore throat, chest congestion, cough and headache beginning 3 days ago.  Cough became productive this morning with green sputum and he was awoken by shortness of breath, felt as if he was having to gasp for air.  No known sick contact.  Tolerating food and liquids.  Has not attempted treatment.  Denies fever, nasal congestion, wheezing.  No prior respiratory history, former smoker.  Past Medical History:  Diagnosis Date   Actinic keratosis    Blindness of left eye    COVID-19 virus infection 11/09/2020   Family history of adverse reaction to anesthesia    Sister - PONV and slow to wake   GERD (gastroesophageal reflux disease)    Hypertension    hypothyroidism    PONV (postoperative nausea and vomiting)    also, slow to wake   Retained metal fragment    in left eye lid    Patient Active Problem List   Diagnosis Date Noted   Pain and swelling of right knee 03/05/2023   Degeneration of cervical intervertebral disc 10/17/2022   Inflammatory arthritis 03/23/2022   Muscle cramping 02/24/2022   Insomnia due to anxiety and fear 07/29/2021   Monocytosis 01/26/2020   Prediabetes 10/14/2019   History of bacterial pneumonia 01/22/2018   Statin intolerance 01/22/2018   Skin neoplasm 01/22/2018   Lipoma of back 01/22/2018   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   GERD without esophagitis 02/17/2017   Tobacco abuse counseling 05/22/2016   Essential hypertension 11/27/2015   Cervicalgia of occipito-atlanto-axial region 09/19/2014   Hyperlipidemia LDL goal <100 02/14/2014   Allergic rhinitis 11/15/2013   Encounter for preventive health examination 02/14/2013   Restless legs 02/12/2013   Chronic right shoulder pain 12/01/2012    Hypothyroidism (acquired) 10/08/2011    Past Surgical History:  Procedure Laterality Date   CATARACT EXTRACTION W/PHACO Right 10/27/2020   Procedure: CATARACT EXTRACTION PHACO AND INTRAOCULAR LENS PLACEMENT (IOC) RIGHT;  Surgeon: Annell Kidney, MD;  Location: [facility];  Service: Ophthalmology;  Laterality: Right;  4.84 1:05.4 7.4%   EYE SURGERY  1987   left eye traua, prosthetic eye ,  still has metal in eyelid   KNEE ARTHROSCOPY  2003   right , with residual numbness medial side of calf       Home Medications    Prior to Admission medications   Medication Sig Start Date End Date Taking? Authorizing Provider  amoxicillin -clavulanate (AUGMENTIN ) 875-125 MG tablet Take 1 tablet by mouth every 12 (twelve) hours. 03/12/24  Yes Hajer Dwyer, Maybelle Spatz, NP  Omega-3 Fatty Acids (FISH OIL PO) Take by mouth.   Yes [provider]  celecoxib  (CELEBREX ) 100 MG capsule Take 2 capsules (200 mg total) by mouth 2 (two) times daily. 02/01/24   Tullo, Teresa L, MD  esomeprazole (NEXIUM) 20 MG packet Take 20 mg by mouth daily before breakfast.    [provider]  losartan  (COZAAR ) 50 MG tablet TAKE 1 TABLET BY MOUTH DAILY 02/05/24   Thersia Flax, MD  SYNTHROID  150 MCG tablet TAKE ONE TABLET EACH MORNING BEFORE BREAKFAST 02/05/24   Thersia Flax, MD  tiZANidine  (ZANAFLEX ) 4 MG tablet TAKE ONE TABLET  BY MOUTH EVERY 6 HOURS AS NEEDED FOR MUSCLE SPASM 02/01/24   Thersia Flax, MD  magic mouthwash SOLN Take 5 mLs by mouth 3 (three) times daily as needed for mouth pain. 04/04/18 09/23/19  Cook, Jayce G, DO    Family History Family History  Problem Relation Age of Onset   Stroke Mother 81       during pregnancy   Aneurysm Mother    COPD Mother    Heart disease Father 34       smoker   Alcohol abuse Father    Early death Sister        cirrhosis   Diabetes Sister    Dementia Maternal Aunt     Social History Social History   Tobacco Use   Smoking status: Former     Current packs/day: 1.00    Average packs/day: 1 pack/day for 35.0 years (35.0 ttl pk-yrs)    Types: Cigarettes   Smokeless tobacco: Never   Tobacco comments:    off and on since age 24  Vaping Use   Vaping status: Never Used  Substance Use Topics   Alcohol use: Yes    Alcohol/week: 8.0 standard drinks of alcohol    Types: 8 Cans of beer per week   Drug use: No     Allergies   Latex, Codeine, and Methotrexate derivatives   Review of Systems Review of Systems   Physical Exam Triage Vital Signs ED Triage Vitals  Encounter Vitals Group     BP 03/08/24 0947 134/84     Systolic BP Percentile --      Diastolic BP Percentile --      Pulse Rate 03/08/24 0947 65     Resp 03/08/24 0947 20     Temp 03/08/24 0947 97.7 F (36.5 C)     Temp Source 03/08/24 0947 Oral     SpO2 03/08/24 0947 96 %     Weight 03/08/24 0944 206 lb (93.4 kg)     Height 03/08/24 0944 5\' 8"  (1.727 m)     Head Circumference --      Peak Flow --      Pain Score 03/08/24 0944 0     Pain Loc --      Pain Education --      Exclude from Growth Chart --    No data found.  Updated Vital Signs BP 134/84 (BP Location: Left Arm)   Pulse 65   Temp 97.7 F (36.5 C) (Oral)   Resp 20   Ht 5\' 8"  (1.727 m)   Wt 206 lb (93.4 kg)   SpO2 96%   BMI 31.32 kg/m   Visual Acuity Right Eye Distance:   Left Eye Distance:   Bilateral Distance:    Right Eye Near:   Left Eye Near:    Bilateral Near:     Physical Exam Constitutional:      Appearance: Normal appearance.  HENT:     Head: Normocephalic.     Right Ear: Tympanic membrane, ear canal and external ear normal.     Left Ear: Tympanic membrane, ear canal and external ear normal.     Nose: Nose normal.     Mouth/Throat:     Pharynx: Posterior oropharyngeal erythema present. No oropharyngeal exudate.  Eyes:     Extraocular Movements: Extraocular movements intact.  Cardiovascular:     Rate and Rhythm: Normal rate and regular rhythm.     Pulses: Normal  pulses.     Heart  sounds: Normal heart sounds.  Pulmonary:     Effort: Pulmonary effort is normal.     Breath sounds: Normal breath sounds.  Musculoskeletal:     Cervical back: Normal range of motion and neck supple.  Neurological:     Mental Status: He is alert and oriented to person, place, and time. Mental status is at baseline.      UC Treatments / Results  Labs (all labs ordered are listed, but only abnormal results are displayed) Labs Reviewed  POCT RAPID STREP A (OFFICE)    EKG   Radiology No results found.  Procedures Procedures (including critical care time)  Medications Ordered in UC Medications - No data to display  Initial Impression / Assessment and Plan / UC Course  I have reviewed the triage vital signs and the nursing notes.  Pertinent labs & imaging results that were available during my care of the patient were reviewed by me and considered in my medical decision making (see chart for details).\\  Viral URI with cough  Patient is in no signs of distress nor toxic appearing.  Vital signs are stable.  Low suspicion for pneumonia, pneumothorax or bronchitis and therefore will defer imaging.  Declined COVID and flu.  Watch and wait antibiotic placed at pharmacy if no improvement seen.May use additional over-the-counter medications as needed for supportive care.  May follow-up with urgent care as needed if symptoms persist or worsen.   Final Clinical Impressions(s) / UC Diagnoses   Final diagnoses:  Viral URI with cough   Discharge Instructions      Your symptoms today are most likely being caused by a virus and should steadily improve in time it can take up to 7 to 10 days before you truly start to see a turnaround however things will get better, if no improvement seen by Wednesday you may pick up Augmentin  which is the antibiotic from the pharmacy    You can take Tylenol  and/or Ibuprofen as needed for fever reduction and pain relief.   For cough:  honey 1/2 to 1 teaspoon (you can dilute the honey in water or another fluid).  You can also use guaifenesin and dextromethorphan for cough. You can use a humidifier for chest congestion and cough.  If you don't have a humidifier, you can sit in the bathroom with the hot shower running.      For sore throat: try warm salt water gargles, cepacol lozenges, throat spray, warm tea or water with lemon/honey, popsicles or ice, or OTC cold relief medicine for throat discomfort.   For congestion: take a daily anti-histamine like Zyrtec, Claritin, and a oral decongestant, such as pseudoephedrine.  You can also use Flonase  1-2 sprays in each nostril daily.   It is important to stay hydrated: drink plenty of fluids (water, gatorade/powerade/pedialyte, juices, or teas) to keep your throat moisturized and help further relieve irritation/discomfort.   ED Prescriptions     Medication Sig Dispense Auth. Provider   amoxicillin -clavulanate (AUGMENTIN ) 875-125 MG tablet Take 1 tablet by mouth every 12 (twelve) hours. 14 tablet Kassady Laboy R, NP      PDMP not reviewed this encounter.   Reena Canning, NP 03/08/24 1017

## 2024-03-08 NOTE — Discharge Instructions (Signed)
 Your symptoms today are most likely being caused by a virus and should steadily improve in time it can take up to 7 to 10 days before you truly start to see a turnaround however things will get better, if no improvement seen by Wednesday you may pick up Augmentin  which is the antibiotic from the pharmacy    You can take Tylenol  and/or Ibuprofen as needed for fever reduction and pain relief.   For cough: honey 1/2 to 1 teaspoon (you can dilute the honey in water or another fluid).  You can also use guaifenesin and dextromethorphan for cough. You can use a humidifier for chest congestion and cough.  If you don't have a humidifier, you can sit in the bathroom with the hot shower running.      For sore throat: try warm salt water gargles, cepacol lozenges, throat spray, warm tea or water with lemon/honey, popsicles or ice, or OTC cold relief medicine for throat discomfort.   For congestion: take a daily anti-histamine like Zyrtec, Claritin, and a oral decongestant, such as pseudoephedrine.  You can also use Flonase  1-2 sprays in each nostril daily.   It is important to stay hydrated: drink plenty of fluids (water, gatorade/powerade/pedialyte, juices, or teas) to keep your throat moisturized and help further relieve irritation/discomfort.

## 2024-03-08 NOTE — ED Triage Notes (Signed)
 Patient states 3 days of cough, sore throat, headache, coughing up green phlegm.

## 2024-05-06 ENCOUNTER — Encounter: Payer: Self-pay | Admitting: Dermatology

## 2024-05-06 ENCOUNTER — Ambulatory Visit (INDEPENDENT_AMBULATORY_CARE_PROVIDER_SITE_OTHER): Payer: Self-pay | Admitting: Dermatology

## 2024-05-06 DIAGNOSIS — L720 Epidermal cyst: Secondary | ICD-10-CM

## 2024-05-06 DIAGNOSIS — L821 Other seborrheic keratosis: Secondary | ICD-10-CM

## 2024-05-06 DIAGNOSIS — L820 Inflamed seborrheic keratosis: Secondary | ICD-10-CM

## 2024-05-06 DIAGNOSIS — Z79899 Other long term (current) drug therapy: Secondary | ICD-10-CM

## 2024-05-06 DIAGNOSIS — D489 Neoplasm of uncertain behavior, unspecified: Secondary | ICD-10-CM

## 2024-05-06 DIAGNOSIS — D229 Melanocytic nevi, unspecified: Secondary | ICD-10-CM

## 2024-05-06 DIAGNOSIS — D692 Other nonthrombocytopenic purpura: Secondary | ICD-10-CM

## 2024-05-06 DIAGNOSIS — L304 Erythema intertrigo: Secondary | ICD-10-CM

## 2024-05-06 DIAGNOSIS — D492 Neoplasm of unspecified behavior of bone, soft tissue, and skin: Secondary | ICD-10-CM

## 2024-05-06 DIAGNOSIS — W908XXA Exposure to other nonionizing radiation, initial encounter: Secondary | ICD-10-CM

## 2024-05-06 DIAGNOSIS — L578 Other skin changes due to chronic exposure to nonionizing radiation: Secondary | ICD-10-CM

## 2024-05-06 DIAGNOSIS — L814 Other melanin hyperpigmentation: Secondary | ICD-10-CM

## 2024-05-06 DIAGNOSIS — L570 Actinic keratosis: Secondary | ICD-10-CM

## 2024-05-06 DIAGNOSIS — Z1283 Encounter for screening for malignant neoplasm of skin: Secondary | ICD-10-CM

## 2024-05-06 DIAGNOSIS — D1801 Hemangioma of skin and subcutaneous tissue: Secondary | ICD-10-CM

## 2024-05-06 DIAGNOSIS — Z7189 Other specified counseling: Secondary | ICD-10-CM

## 2024-05-06 MED ORDER — KETOCONAZOLE 2 % EX CREA: TOPICAL_CREAM | CUTANEOUS | 11 refills | Status: AC

## 2024-05-06 MED ORDER — HYDROCORTISONE 2.5 % EX CREA: TOPICAL_CREAM | CUTANEOUS | 11 refills | Status: AC

## 2024-05-06 NOTE — Progress Notes (Unsigned)
 Follow-Up Visit   Subjective  Tim Owen is a 58 y.o. male who presents for the following: Skin Cancer Screening and Full Body Skin Exam Hx of ak, hx of isk,  Spot at right neck bothering patient , 2 spots on right legs Also reports painful rash under arms he developed 5 weeks ago,  Has history of psoriatic arthritis   The patient presents for Total-Body Skin Exam (TBSE) for skin cancer screening and mole check. The patient has spots, moles and lesions to be evaluated, some may be new or changing and the patient may have concern these could be cancer.  The following portions of the chart were reviewed this encounter and updated as appropriate: medications, allergies, medical history  Review of Systems:  No other skin or systemic complaints except as noted in HPI or Assessment and Plan.  Objective  Well appearing patient in no apparent distress; mood and affect are within normal limits.  A full examination was performed including scalp, head, eyes, ears, nose, lips, neck, chest, axillae, abdomen, back, buttocks, bilateral upper extremities, bilateral lower extremities, hands, feet, fingers, toes, fingernails, and toenails. All findings within normal limits unless otherwise noted below.   Relevant physical exam findings are noted in the Assessment and Plan.  right temple x 1 Erythematous thin papules/macules with gritty scale.  right thigh x 1, right calf x 2, right lateral neck x 2 (5) Erythematous stuck-on, waxy papule or plaque superior sternum 0.6 cm pearly papule    Assessment & Plan   SKIN CANCER SCREENING PERFORMED TODAY.  ACTINIC DAMAGE - Chronic condition, secondary to cumulative UV/sun exposure - diffuse scaly erythematous macules with underlying dyspigmentation - Recommend daily broad spectrum sunscreen SPF 30+ to sun-exposed areas, reapply every 2 hours as needed.  - Staying in the shade or wearing long sleeves, sun glasses (UVA+UVB protection) and wide brim  hats (4-inch brim around the entire circumference of the hat) are also recommended for sun protection.  - Call for new or changing lesions.  LENTIGINES, SEBORRHEIC KERATOSES, HEMANGIOMAS - Benign normal skin lesions - Benign-appearing - Call for any changes  MELANOCYTIC NEVI - Tan-brown and/or pink-flesh-colored symmetric macules and papules - Benign appearing on exam today - Observation - Call clinic for new or changing moles - Recommend daily use of broad spectrum spf 30+ sunscreen to sun-exposed areas.   Purpura - Chronic; persistent and recurrent.  Treatable, but not curable. - Violaceous macules and patches - Benign - Related to trauma, age, sun damage and/or use of blood thinners, chronic use of topical and/or oral steroids - Observe - Can use OTC arnica containing moisturizer such as Dermend Bruise Formula if desired - Call for worsening or other concerns  INTERTRIGO Exam: Erythematous macerated patches in body folds Chronic and persistent condition with duration or expected duration over one year. Condition is bothersome/symptomatic for patient. Currently flared. Intertrigo is a chronic recurrent rash that occurs in skin fold areas that may be associated with friction; heat; moisture; yeast; fungus; and bacteria.  It is exacerbated by increased movement / activity; sweating; and higher atmospheric temperature.  Use of an absorbant powder such as Zeasorb AF powder or other OTC antifungal powder to the area daily can prevent rash recurrence. Other options to help keep the area dry include blow drying the area after bathing or using antiperspirant products such as Duradry sweat minimizing gel. Treatment Plan: Start ketoconazole  2 % cream apply topically to affected areas qhs on T, Thu, Sat for intertrigo rash  Start Hc 2.5 % cream apply topically to affected areas qhs on M W F for intertrigo rash   May consider Skin medicinals intertrigo cream   ERYTHEMA INTERTRIGO   Related  Medications ketoconazole  (NIZORAL ) 2 % cream Apply topically to affected area daily at bedtime on T Th Sat for intertrigo rash hydrocortisone  2.5 % cream Apply topically to affected area daily at bedtime on M W F for intertrigo rash ACTINIC KERATOSIS right temple x 1 Actinic keratoses are precancerous spots that appear secondary to cumulative UV radiation exposure/sun exposure over time. They are chronic with expected duration over 1 year. A portion of actinic keratoses will progress to squamous cell carcinoma of the skin. It is not possible to reliably predict which spots will progress to skin cancer and so treatment is recommended to prevent development of skin cancer.  Recommend daily broad spectrum sunscreen SPF 30+ to sun-exposed areas, reapply every 2 hours as needed.  Recommend staying in the shade or wearing long sleeves, sun glasses (UVA+UVB protection) and wide brim hats (4-inch brim around the entire circumference of the hat). Call for new or changing lesions. Destruction of lesion - right temple x 1 Complexity: simple   Destruction method: cryotherapy   Informed consent: discussed and consent obtained   Timeout:  patient name, date of birth, surgical site, and procedure verified Lesion destroyed using liquid nitrogen: Yes   Region frozen until ice ball extended beyond lesion: Yes   Outcome: patient tolerated procedure well with no complications   Post-procedure details: wound care instructions given    INFLAMED SEBORRHEIC KERATOSIS (5) right thigh x 1, right calf x 2, right lateral neck x 2 (5) Symptomatic, irritating, patient would like treated. Destruction of lesion - right thigh x 1, right calf x 2, right lateral neck x 2 (5) Complexity: simple   Destruction method: cryotherapy   Informed consent: discussed and consent obtained   Timeout:  patient name, date of birth, surgical site, and procedure verified Lesion destroyed using liquid nitrogen: Yes   Region frozen until  ice ball extended beyond lesion: Yes   Outcome: patient tolerated procedure well with no complications   Post-procedure details: wound care instructions given    NEOPLASM OF UNCERTAIN BEHAVIOR superior sternum Epidermal / dermal shaving  Lesion diameter (cm):  0.6 Informed consent: discussed and consent obtained   Timeout: patient name, date of birth, surgical site, and procedure verified   Procedure prep:  Patient was prepped and draped in usual sterile fashion Prep type:  Isopropyl alcohol Anesthesia: the lesion was anesthetized in a standard fashion   Anesthetic:  1% lidocaine  w/ epinephrine  1-100,000 buffered w/ 8.4% NaHCO3 Instrument used: flexible razor blade   Hemostasis achieved with: pressure, aluminum chloride and electrodesiccation   Outcome: patient tolerated procedure well   Post-procedure details: sterile dressing applied and wound care instructions given   Dressing type: bandage and petrolatum    Destruction of lesion Complexity: extensive   Destruction method: electrodesiccation and curettage   Informed consent: discussed and consent obtained   Timeout:  patient name, date of birth, surgical site, and procedure verified Procedure prep:  Patient was prepped and draped in usual sterile fashion Prep type:  Isopropyl alcohol Anesthesia: the lesion was anesthetized in a standard fashion   Anesthetic:  1% lidocaine  w/ epinephrine  1-100,000 buffered w/ 8.4% NaHCO3 Curettage performed in three different directions: Yes   Electrodesiccation performed over the curetted area: Yes   Lesion length (cm):  0.6 Lesion width (cm):  0.6  Margin per side (cm):  0.2 Final wound size (cm):  1 Hemostasis achieved with:  pressure, aluminum chloride and electrodesiccation Outcome: patient tolerated procedure well with no complications   Post-procedure details: sterile dressing applied and wound care instructions given   Dressing type: bandage and petrolatum    Specimen 1 - Surgical  pathology Differential Diagnosis: r/o bcc ED&C today  Check Margins: No R/o bcc SKIN CANCER SCREENING   ACTINIC SKIN DAMAGE   LENTIGO   MELANOCYTIC NEVUS, UNSPECIFIED LOCATION   INTERTRIGO   COUNSELING AND COORDINATION OF CARE   MEDICATION MANAGEMENT   Return in about 1 year (around 05/06/2025) for TBSE.  IEleanor Blush, CMA, am acting as scribe for Alm Rhyme, MD.   Documentation: I have reviewed the above documentation for accuracy and completeness, and I agree with the above.  Alm Rhyme, MD

## 2024-05-06 NOTE — Patient Instructions (Addendum)
 Electrodesiccation and Curettage ("Scrape and Burn") Wound Care Instructions  Leave the original bandage on for 24 hours if possible.  If the bandage becomes soaked or soiled before that time, it is OK to remove it and examine the wound.  A small amount of post-operative bleeding is normal.  If excessive bleeding occurs, remove the bandage, place gauze over the site and apply continuous pressure (no peeking) over the area for 30 minutes. If this does not work, please call our clinic as soon as possible or page your doctor if it is after hours.   Once a day, cleanse the wound with soap and water. It is fine to shower. If a thick crust develops you may use a Q-tip dipped into dilute hydrogen peroxide (mix 1:1 with water) to dissolve it.  Hydrogen peroxide can slow the healing process, so use it only as needed.    After washing, apply petroleum jelly (Vaseline) or an antibiotic ointment if your doctor prescribed one for you, followed by a bandage.    For best healing, the wound should be covered with a layer of ointment at all times. If you are not able to keep the area covered with a bandage to hold the ointment in place, this may mean re-applying the ointment several times a day.  Continue this wound care until the wound has healed and is no longer open. It may take several weeks for the wound to heal and close.  Itching and mild discomfort is normal during the healing process.  If you have any discomfort, you can take Tylenol (acetaminophen) or ibuprofen as directed on the bottle. (Please do not take these if you have an allergy to them or cannot take them for another reason).  Some redness, tenderness and white or yellow material in the wound is normal healing.  If the area becomes very sore and red, or develops a thick yellow-green material (pus), it may be infected; please notify us.    Wound healing continues for up to one year following surgery. It is not unusual to experience pain in the scar  from time to time during the interval.  If the pain becomes severe or the scar thickens, you should notify the office.    A slight amount of redness in a scar is expected for the first six months.  After six months, the redness will fade and the scar will soften and fade.  The color difference becomes less noticeable with time.  If there are any problems, return for a post-op surgery check at your earliest convenience.  To improve the appearance of the scar, you can use silicone scar gel, cream, or sheets (such as Mederma or Serica) every night for up to one year. These are available over the counter (without a prescription).  Please call our office at [phone] for any questions or concerns.   Biopsy Wound Care Instructions  Leave the original bandage on for 24 hours if possible.  If the bandage becomes soaked or soiled before that time, it is OK to remove it and examine the wound.  A small amount of post-operative bleeding is normal.  If excessive bleeding occurs, remove the bandage, place gauze over the site and apply continuous pressure (no peeking) over the area for 30 minutes. If this does not work, please call our clinic as soon as possible or page your doctor if it is after hours.   Once a day, cleanse the wound with soap and water. It is fine to shower.  If a thick crust develops you may use a Q-tip dipped into dilute hydrogen peroxide (mix 1:1 with water) to dissolve it.  Hydrogen peroxide can slow the healing process, so use it only as needed.    After washing, apply petroleum jelly (Vaseline) or an antibiotic ointment if your doctor prescribed one for you, followed by a bandage.    For best healing, the wound should be covered with a layer of ointment at all times. If you are not able to keep the area covered with a bandage to hold the ointment in place, this may mean re-applying the ointment several times a day.  Continue this wound care until the wound has healed and is no longer  open.   Itching and mild discomfort is normal during the healing process. However, if you develop pain or severe itching, please call our office.   If you have any discomfort, you can take Tylenol (acetaminophen) or ibuprofen as directed on the bottle. (Please do not take these if you have an allergy to them or cannot take them for another reason).  Some redness, tenderness and white or yellow material in the wound is normal healing.  If the area becomes very sore and red, or develops a thick yellow-green material (pus), it may be infected; please notify us.    If you have stitches, return to clinic as directed to have the stitches removed. You will continue wound care for 2-3 days after the stitches are removed.   Wound healing continues for up to one year following surgery. It is not unusual to experience pain in the scar from time to time during the interval.  If the pain becomes severe or the scar thickens, you should notify the office.    A slight amount of redness in a scar is expected for the first six months.  After six months, the redness will fade and the scar will soften and fade.  The color difference becomes less noticeable with time.  If there are any problems, return for a post-op surgery check at your earliest convenience.  To improve the appearance of the scar, you can use silicone scar gel, cream, or sheets (such as Mederma or Serica) every night for up to one year. These are available over the counter (without a prescription).  Please call our office at [phone] for any questions or concerns.     Actinic keratoses are precancerous spots that appear secondary to cumulative UV radiation exposure/sun exposure over time. They are chronic with expected duration over 1 year. A portion of actinic keratoses will progress to squamous cell carcinoma of the skin. It is not possible to reliably predict which spots will progress to skin cancer and so treatment is recommended to  prevent development of skin cancer.  Recommend daily broad spectrum sunscreen SPF 30+ to sun-exposed areas, reapply every 2 hours as needed.  Recommend staying in the shade or wearing long sleeves, sun glasses (UVA+UVB protection) and wide brim hats (4-inch brim around the entire circumference of the hat). Call for new or changing lesions.   Cryotherapy Aftercare  Wash gently with soap and water everyday.   Apply Vaseline and Band-Aid daily until healed.   Seborrheic Keratosis  What causes seborrheic keratoses? Seborrheic keratoses are harmless, common skin growths that first appear during adult life.  As time goes by, more growths appear.  Some people may develop a large number of them.  Seborrheic keratoses appear on both covered and uncovered body parts.  They are  not caused by sunlight.  The tendency to develop seborrheic keratoses can be inherited.  They vary in color from skin-colored to gray, brown, or even black.  They can be either smooth or have a rough, warty surface.   Seborrheic keratoses are superficial and look as if they were stuck on the skin.  Under the microscope this type of keratosis looks like layers upon layers of skin.  That is why at times the top layer may seem to fall off, but the rest of the growth remains and re-grows.    Treatment Seborrheic keratoses do not need to be treated, but can easily be removed in the office.  Seborrheic keratoses often cause symptoms when they rub on clothing or jewelry.  Lesions can be in the way of shaving.  If they become inflamed, they can cause itching, soreness, or burning.  Removal of a seborrheic keratosis can be accomplished by freezing, burning, or surgery. If any spot bleeds, scabs, or grows rapidly, please return to have it checked, as these can be an indication of a skin cancer.   Melanoma ABCDEs  Melanoma is the most dangerous type of skin cancer, and is the leading cause of death from skin disease.  You are more likely to  develop melanoma if you: Have light-colored skin, light-colored eyes, or red or blond hair Spend a lot of time in the sun Tan regularly, either outdoors or in a tanning bed Have had blistering sunburns, especially during childhood Have a close family member who has had a melanoma Have atypical moles or large birthmarks  Early detection of melanoma is key since treatment is typically straightforward and cure rates are extremely high if we catch it early.   The first sign of melanoma is often a change in a mole or a new dark spot.  The ABCDE system is a way of remembering the signs of melanoma.  A for asymmetry:  The two halves do not match. B for border:  The edges of the growth are irregular. C for color:  A mixture of colors are present instead of an even brown color. D for diameter:  Melanomas are usually (but not always) greater than 6mm - the size of a pencil eraser. E for evolution:  The spot keeps changing in size, shape, and color.  Please check your skin once per month between visits. You can use a small mirror in front and a large mirror behind you to keep an eye on the back side or your body.   If you see any new or changing lesions before your next follow-up, please call to schedule a visit.  Please continue daily skin protection including broad spectrum sunscreen SPF 30+ to sun-exposed areas, reapplying every 2 hours as needed when you're outdoors.   Staying in the shade or wearing long sleeves, sun glasses (UVA+UVB protection) and wide brim hats (4-inch brim around the entire circumference of the hat) are also recommended for sun protection.    Due to recent changes in healthcare laws, you may see results of your pathology and/or laboratory studies on MyChart before the doctors have had a chance to review them. We understand that in some cases there may be results that are confusing or concerning to you. Please understand that not all results are received at the same time and  often the doctors may need to interpret multiple results in order to provide you with the best plan of care or course of treatment. Therefore, we ask that you  please give [place] 2 business days to thoroughly review all your results before contacting the office for clarification. Should we see a critical lab result, you will be contacted sooner.   If You Need Anything After Your Visit  If you have any questions or concerns for your doctor, please call our main line at [phone] and press option 4 to reach your doctor's medical assistant. If no one answers, please leave a voicemail as directed and we will return your call as soon as possible. Messages left after 4 pm will be answered the following business day.   You may also send [place] a message via MyChart. We typically respond to MyChart messages within 1-2 business days.  For prescription refills, please ask your pharmacy to contact our office. Our fax number is [phone].  If you have an urgent issue when the clinic is closed that cannot wait until the next business day, you can page your doctor at the number below.    Please note that while we do our best to be available for urgent issues outside of office hours, we are not available 24/7.   If you have an urgent issue and are unable to reach [place], you may choose to seek medical care at your doctor's office, retail clinic, urgent care center, or emergency room.  If you have a medical emergency, please immediately call 911 or go to the emergency department.  Pager Numbers  - Dr. Gwen Pounds: [phone]  - Dr. Roseanne Reno: [phone]  - Dr. Katrinka Blazing: [phone]   In the event of inclement weather, please call our main line at [phone] for an update on the status of any delays or closures.  Dermatology Medication Tips: Please keep the boxes that topical medications come in in order to help keep track of the instructions about where and how to use these. Pharmacies typically print the  medication instructions only on the boxes and not directly on the medication tubes.   If your medication is too expensive, please contact our office at [phone] option 4 or send [place] a message through MyChart.   We are unable to tell what your co-pay for medications will be in advance as this is different depending on your insurance coverage. However, we may be able to find a substitute medication at lower cost or fill out paperwork to get insurance to cover a needed medication.   If a prior authorization is required to get your medication covered by your insurance company, please allow [place] 1-2 business days to complete this process.  Drug prices often vary depending on where the prescription is filled and some pharmacies may offer cheaper prices.  The website www.goodrx.com contains coupons for medications through different pharmacies. The prices here do not account for what the cost may be with help from insurance (it may be cheaper with your insurance), but the website can give you the price if you did not use any insurance.  - You can print the associated coupon and take it with your prescription to the pharmacy.  - You may also stop by our office during regular business hours and pick up a GoodRx coupon card.  - If you need your prescription sent electronically to a different pharmacy, notify our office through [facility] or by phone at [phone] option 4.     Si Usted Necesita Algo Despus de Su Visita  Tambin puede enviarnos un mensaje a travs de Clinical cytogeneticist. Por lo general respondemos a los mensajes de MyChart en el transcurso de  1 a 2 das hbiles.  Para renovar recetas, por favor pida a su farmacia que se ponga en contacto con nuestra oficina. Annie Sable de fax es [place] [phone].  Si tiene un asunto urgente cuando la clnica est cerrada y que no puede esperar hasta el siguiente da hbil, puede llamar/localizar a su doctor(a) al nmero que aparece a continuacin.    Por favor, tenga en cuenta que aunque hacemos todo lo posible para estar disponibles para asuntos urgentes fuera del horario de [place], no estamos disponibles las 24 horas del da, los 7 [address]  [address] de la [place].   Si tiene un problema urgente y no puede comunicarse con nosotros, puede optar por buscar atencin mdica  en el consultorio de su doctor(a), en una clnica privada, en un centro de atencin urgente o en una sala de emergencias.  Si tiene Engineer, drilling, por favor llame inmediatamente al 911 o vaya a la sala de emergencias.  Nmeros de bper  - Dr. Gwen Pounds: [phone]  - Dra. Roseanne Reno: [phone]  - Dr. Katrinka Blazing: [phone]   En caso de inclemencias del tiempo, por favor llame a Lacy Duverney principal al [phone] para una actualizacin sobre el [place] de cualquier retraso o cierre.  Consejos para la medicacin en dermatologa: Por favor, guarde las cajas en las que vienen los medicamentos de uso tpico para ayudarle a seguir las instrucciones sobre dnde y cmo usarlos. Las farmacias generalmente imprimen las instrucciones del medicamento slo en las cajas y no directamente en los tubos del [place].   Si su medicamento es muy caro, por favor, pngase en contacto con Rolm Gala llamando al [phone] y presione la opcin 4 o envenos un mensaje a travs de Clinical cytogeneticist.   No podemos decirle cul ser su copago por los medicamentos por adelantado ya que esto es diferente dependiendo de la cobertura de su seguro. Sin embargo, es posible que podamos encontrar un medicamento sustituto a Audiological scientist un formulario para que el seguro cubra el medicamento que se considera necesario.   Si se requiere una autorizacin previa para que su compaa de seguros [place] su medicamento, por favor permtanos de 1 a 2 das hbiles para completar [address]  Los precios de los medicamentos varan con frecuencia dependiendo del Environmental consultant de dnde se surte la receta y alguna  farmacias pueden ofrecer precios ms baratos.  El sitio web www.goodrx.com tiene cupones para medicamentos de Health and safety inspector. Los precios aqu no tienen en cuenta lo que podra costar con la ayuda del seguro (puede ser ms barato con su seguro), pero el sitio web puede darle el precio si no utiliz Tourist information centre manager.  - Puede imprimir el cupn correspondiente y llevarlo con su receta a la farmacia.  - Tambin puede pasar por nuestra oficina durante el horario de atencin regular y Education officer, museum una tarjeta de cupones de GoodRx.  - Si necesita que su receta se enve electrnicamente a una farmacia diferente, informe a nuestra oficina a travs de MyChart de [place] o por telfono llamando al [phone] y presione la opcin 4.

## 2024-05-08 ENCOUNTER — Ambulatory Visit: Payer: Self-pay | Admitting: Dermatology

## 2024-05-08 LAB — SURGICAL PATHOLOGY

## 2024-05-12 NOTE — Telephone Encounter (Signed)
-----   Message from Alm Rhyme sent at 05/08/2024  7:53 PM EDT ----- FINAL DIAGNOSIS        1. Skin, superior sternum :       CONSISTENT WITH SURFACE OF AN EPIDERMOID CYST   Benign cyst No further treatment needed unless recurs ----- Message ----- From: Interface, Lab In [place] Sent: 05/08/2024   5:39 PM EDT To: Alm JAYSON Rhyme, MD

## 2024-05-12 NOTE — Telephone Encounter (Signed)
 Patient informed of pathology results. He has noticed that a lesion that was treated with LN2 is red. He states not tender or oozing. I advised patient that can be normal, but it's best to send a photo via MyChart for Dr. Hester to review.

## 2024-05-13 ENCOUNTER — Encounter: Payer: Self-pay | Admitting: Dermatology

## 2024-05-13 MED ORDER — DOXYCYCLINE MONOHYDRATE 100 MG PO CAPS: ORAL_CAPSULE | ORAL | 0 refills | Status: DC

## 2024-05-13 MED ORDER — MUPIROCIN 2 % EX OINT: TOPICAL_OINTMENT | CUTANEOUS | 0 refills | Status: DC

## 2024-05-28 NOTE — Telephone Encounter (Signed)
 error

## 2024-08-06 ENCOUNTER — Encounter: Payer: Self-pay | Admitting: Internal Medicine

## 2024-08-06 ENCOUNTER — Ambulatory Visit (INDEPENDENT_AMBULATORY_CARE_PROVIDER_SITE_OTHER): Payer: Self-pay | Admitting: Internal Medicine

## 2024-08-06 VITALS — BP 106/68 | HR 65 | Ht 68.0 in | Wt 208.8 lb

## 2024-08-06 DIAGNOSIS — M1380 Other specified arthritis, unspecified site: Secondary | ICD-10-CM

## 2024-08-06 DIAGNOSIS — I10 Essential (primary) hypertension: Secondary | ICD-10-CM

## 2024-08-06 DIAGNOSIS — R7303 Prediabetes: Secondary | ICD-10-CM

## 2024-08-06 DIAGNOSIS — G5601 Carpal tunnel syndrome, right upper limb: Secondary | ICD-10-CM

## 2024-08-06 DIAGNOSIS — Z79899 Other long term (current) drug therapy: Secondary | ICD-10-CM

## 2024-08-06 DIAGNOSIS — Z125 Encounter for screening for malignant neoplasm of prostate: Secondary | ICD-10-CM

## 2024-08-06 DIAGNOSIS — R252 Cramp and spasm: Secondary | ICD-10-CM

## 2024-08-06 DIAGNOSIS — E039 Hypothyroidism, unspecified: Secondary | ICD-10-CM

## 2024-08-06 DIAGNOSIS — G2581 Restless legs syndrome: Secondary | ICD-10-CM

## 2024-08-06 DIAGNOSIS — E785 Hyperlipidemia, unspecified: Secondary | ICD-10-CM

## 2024-08-06 LAB — COMPREHENSIVE METABOLIC PANEL WITH GFR
ALT: 34 U/L (ref 0–53)
AST: 24 U/L (ref 0–37)
Albumin: 4.6 g/dL (ref 3.5–5.2)
Alkaline Phosphatase: 58 U/L (ref 39–117)
BUN: 17 mg/dL (ref 6–23)
CO2: 28 meq/L (ref 19–32)
Calcium: 9.2 mg/dL (ref 8.4–10.5)
Chloride: 104 meq/L (ref 96–112)
Creatinine, Ser: 1.14 mg/dL (ref 0.40–1.50)
GFR: 70.48 mL/min (ref >60.00)
Glucose, Bld: 102 mg/dL — ABNORMAL HIGH (ref 70–99)
Potassium: 4.9 meq/L (ref 3.5–5.1)
Sodium: 140 meq/L (ref 135–145)
Total Bilirubin: 0.6 mg/dL (ref 0.2–1.2)
Total Protein: 6.6 g/dL (ref 6.0–8.3)

## 2024-08-06 LAB — SEDIMENTATION RATE: Sed Rate: 9 mm/h (ref 0–20)

## 2024-08-06 LAB — TSH: TSH: 0.49 u[IU]/mL (ref 0.35–5.50)

## 2024-08-06 LAB — LIPID PANEL
Cholesterol: 165 mg/dL (ref 0–200)
HDL: 39.8 mg/dL (ref >39.00)
LDL Cholesterol: 114 mg/dL — ABNORMAL HIGH (ref 0–99)
NonHDL: 125.31
Total CHOL/HDL Ratio: 4
Triglycerides: 59 mg/dL (ref 0.0–149.0)
VLDL: 11.8 mg/dL (ref 0.0–40.0)

## 2024-08-06 LAB — LDL CHOLESTEROL, DIRECT: Direct LDL: 121 mg/dL

## 2024-08-06 LAB — PSA: PSA: 0.47 ng/mL (ref 0.10–4.00)

## 2024-08-06 LAB — HEMOGLOBIN A1C: Hgb A1c MFr Bld: 6 % (ref 4.6–6.5)

## 2024-08-06 LAB — CK: Total CK: 250 U/L — ABNORMAL HIGH (ref 17–232)

## 2024-08-06 MED ORDER — PREDNISONE 10 MG PO TABS: ORAL_TABLET | ORAL | 0 refills | Status: AC

## 2024-08-06 MED ORDER — CELECOXIB 100 MG PO CAPS: 200.0000 mg | ORAL_CAPSULE | Freq: Two times a day (BID) | ORAL | 5 refills | Status: AC

## 2024-08-06 MED ORDER — GABAPENTIN 100 MG PO CAPS: ORAL_CAPSULE | ORAL | 3 refills | Status: AC

## 2024-08-06 MED ORDER — LOSARTAN POTASSIUM 50 MG PO TABS
50.0000 mg | ORAL_TABLET | Freq: Every day | ORAL | 1 refills | Status: AC
Start: 2024-08-06 — End: ?

## 2024-08-06 NOTE — Progress Notes (Unsigned)
 Subjective:  Patient ID: Tim Owen, male    DOB: 23-Sep-1966  Age: 58 y.o. MRN: [id number]  CC: The primary encounter diagnosis was Essential hypertension. Diagnoses of Hyperlipidemia LDL goal <100, Prediabetes, Hypothyroidism (acquired), Prostate cancer screening, and Long-term use of high-risk medication were also pertinent to this visit.   HPI Tim Owen presents for  Chief Complaint  Patient presents with   Medical Management of Chronic Issues    6 month follow up    1)  right forearm numbness and burning sensation worse at night , woken up by all fingers tingling  burning sensation to elbow.    h/o DDD by 2015 plain cervical  films, most notably at C6 level . 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 .  Drinks 3-4 bottles of water plus [employer] every night.  Walks 4 miles daily . Inner thighs and hamstrings will spasm.  Does not stretch due to stiffness.  Has been taking 1000 mg tylenol  and celebrex  once daily in the morning  3) right shoulder bursitis aggravated by recent work .  Imporved with a steroid injection in March under ultrasound until recently  Lab Results  Component Value Date   PSA 0.40 06/23/2022   PSA 0.61 01/26/2021   PSA 0.37 01/26/2020       Outpatient Medications Prior to Visit  Medication Sig Dispense Refill   celecoxib  (CELEBREX ) 100 MG capsule Take 2 capsules (200 mg total) by mouth 2 (two) times daily. 120 capsule 5   doxycycline  (MONODOX ) 100 MG capsule Take one cap po BID with food and plenty of drink x 7 days. 14 capsule 0   esomeprazole (NEXIUM) 20 MG packet Take 20 mg by mouth daily before breakfast.     hydrocortisone  2.5 % cream Apply topically to affected area daily at bedtime on M W F for intertrigo rash 30 g 11   ketoconazole  (NIZORAL ) 2 % cream Apply topically to affected area daily at bedtime on T Th Sat for intertrigo rash 30 g 11   losartan  (COZAAR ) 50 MG tablet TAKE 1  TABLET BY MOUTH DAILY 90 tablet 1   mupirocin  ointment (BACTROBAN ) 2 % Apply to aa TID until healed. 22 g 0   Omega-3 Fatty Acids (FISH OIL PO) Take by mouth.     SYNTHROID  150 MCG tablet TAKE ONE TABLET EACH MORNING BEFORE BREAKFAST 90 tablet 1   tiZANidine  (ZANAFLEX ) 4 MG tablet TAKE ONE TABLET BY MOUTH EVERY 6 HOURS AS NEEDED FOR MUSCLE SPASM 90 tablet 5   amoxicillin -clavulanate (AUGMENTIN ) 875-125 MG tablet Take 1 tablet by mouth every 12 (twelve) hours. (Patient not taking: Reported on 08/06/2024) 14 tablet 0   Facility-Administered Medications Prior to Visit  Medication Dose Route Frequency Provider Last Rate Last Admin   lidocaine  (XYLOCAINE ) 2 % (with pres) injection 30 mg  1.5 mL Other Once Marylynn Verneita CROME, MD       triamcinolone  acetonide (KENALOG -40) injection 20 mg  20 mg Intra-articular Once Kanyon Seibold L, MD        Review of Systems;  Patient denies headache, fevers, malaise, unintentional weight loss, skin rash, eye pain, sinus congestion and sinus pain, sore throat, dysphagia,  hemoptysis , cough, dyspnea, wheezing, chest pain, palpitations, orthopnea, edema, abdominal pain, nausea, melena, diarrhea, constipation, flank pain, dysuria, hematuria, urinary  Frequency, nocturia, numbness, tingling, seizures,  Focal weakness, Loss of consciousness,  Tremor, insomnia, depression, anxiety, and suicidal  ideation.      Objective:  BP 130/78   Pulse 65   Ht 5' 8 (1.727 m)   Wt 208 lb 12.8 oz (94.7 kg)   SpO2 98%   BMI 31.75 kg/m   BP Readings from Last 3 Encounters:  08/06/24 130/78  03/08/24 134/84  02/01/24 124/86    Wt Readings from Last 3 Encounters:  08/06/24 208 lb 12.8 oz (94.7 kg)  03/08/24 206 lb (93.4 kg)  02/01/24 208 lb 9.6 oz (94.6 kg)    Physical Exam  Lab Results  Component Value Date   HGBA1C 5.8 04/30/2023   HGBA1C 5.9 06/23/2022   HGBA1C 5.8 07/29/2021    Lab Results  Component Value Date   CREATININE 1.22 02/01/2024   CREATININE 1.27  11/20/2023   CREATININE 1.22 10/16/2023    Lab Results  Component Value Date   WBC 7.8 01/13/2022   HGB 14.3 01/13/2022   HCT 42.7 01/13/2022   PLT 289.0 01/13/2022   GLUCOSE 98 02/01/2024   CHOL 162 01/02/2022   TRIG 53 01/02/2022   HDL 54 01/02/2022   LDLDIRECT 145.0 12/03/2015   LDLCALC 94 01/02/2022   ALT 38 02/01/2024   AST 26 02/01/2024   NA 139 02/01/2024   K 4.7 02/01/2024   CL 103 02/01/2024   CREATININE 1.22 02/01/2024   BUN 20 02/01/2024   CO2 28 02/01/2024   TSH 0.54 02/01/2024   PSA 0.40 06/23/2022   HGBA1C 5.8 04/30/2023    No results found.  Assessment & Plan:  .Essential hypertension  Hyperlipidemia LDL goal <100  Prediabetes  Hypothyroidism (acquired)  Prostate cancer screening  Long-term use of high-risk medication     I spent 34 minutes on the day of this face to face encounter reviewing patient's  most recent visit with cardiology,  nephrology,  and neurology,  prior relevant surgical and non surgical procedures, recent  labs and imaging studies, counseling on weight management,  reviewing the assessment and plan with patient, and post visit ordering and reviewing of  diagnostics and therapeutics with patient  .   Follow-up: No follow-ups on file.   Verneita LITTIE Kettering, MD

## 2024-08-06 NOTE — Patient Instructions (Addendum)
 Increase tylenol  to 1000 mg every 8 hours  FOR PAIN MANAGEMENT   Suspend celebrex  while taking prednisone  ; resume it once or twice daily,  once you have gotten to the last dose of prednisone   I am prescribing Gabapentin for  the right hand nerve pain from carpal tunnel :  2 tablets at dinner , one at bedtime.  This can be increased if needed so let me know if it is not helping at this dose   Ok to continue muscle relaxer 1-2 times daily ,  BUT SOAK IN A HOT BATH FOR 15 MINUTES BEFORE BED AND STRETCHING

## 2024-08-08 ENCOUNTER — Ambulatory Visit: Payer: Self-pay | Admitting: Internal Medicine

## 2024-08-08 DIAGNOSIS — G5601 Carpal tunnel syndrome, right upper limb: Secondary | ICD-10-CM | POA: Insufficient documentation

## 2024-08-08 MED ORDER — LEVOTHYROXINE SODIUM 125 MCG PO TABS: 125.0000 ug | ORAL_TABLET | Freq: Every day | ORAL | 3 refills | Status: AC

## 2024-08-08 NOTE — Assessment & Plan Note (Signed)
He declines medications.  Iron deficiency has been ruled out

## 2024-08-08 NOTE — Assessment & Plan Note (Signed)
 He continues to defer surgery. Prednisone  taper prescribed, suspend celebrex  until taper has completed

## 2024-08-08 NOTE — Assessment & Plan Note (Addendum)
 Aggravated by excessive exertion and reports that the symptoms are not relieved with muscle relaxers,  pickle juice etc. Normal ESR and lytes,  mildly elevated muscle enzyme.   Recommended a nightly regimen of soaks and stretching vs referral to Gen surg for muscle biopsy if no improvement  Lab Results  Component Value Date   CKTOTAL 250 (H) 08/06/2024

## 2024-08-08 NOTE — Assessment & Plan Note (Signed)
 TSH is nbordering on overactive on 150 mcg daily   will reduce dose to 125 mcg given elevated CK Lab Results  Component Value Date   TSH 0.49 08/06/2024

## 2024-08-08 NOTE — Assessment & Plan Note (Signed)
 Previously referred to and managed by Rheumatology,  did not tolerate Methotrexate due to elevated LFTs.  Currently quiescent based on current  ESR and CRP  but muscle enzyme remains mildly elevated  Lab Results  Component Value Date   ALT 34 08/06/2024   AST 24 08/06/2024   ALKPHOS 58 08/06/2024   BILITOT 0.6 08/06/2024   Lab Results  Component Value Date   ESRSEDRATE 9 08/06/2024   Lab Results  Component Value Date   CRP <1.0 10/16/2023   Lab Results  Component Value Date   CKTOTAL 250 (H) 08/06/2024

## 2024-09-08 NOTE — Addendum Note (Signed)
 Addended by: MARYLEN PRO A on: 09/08/2024 02:13 PM   Modules accepted: Orders

## 2024-09-19 ENCOUNTER — Other Ambulatory Visit: Payer: Self-pay

## 2024-09-19 DIAGNOSIS — R252 Cramp and spasm: Secondary | ICD-10-CM

## 2024-09-19 DIAGNOSIS — E039 Hypothyroidism, unspecified: Secondary | ICD-10-CM

## 2024-09-20 LAB — THYROID PANEL WITH TSH
Free Thyroxine Index: 2.2 (ref 1.2–4.9)
T3 Uptake Ratio: 28 % (ref 24–39)
T4, Total: 7.9 ug/dL (ref 4.5–12.0)
TSH: 1.71 u[IU]/mL (ref 0.450–4.500)

## 2024-09-20 LAB — CK: Total CK: 105 U/L (ref 41–331)

## 2025-02-05 ENCOUNTER — Ambulatory Visit: Payer: Self-pay | Admitting: Internal Medicine

## 2025-05-07 ENCOUNTER — Ambulatory Visit: Payer: Self-pay | Admitting: Dermatology
